# Patient Record
Sex: Female | Born: 1940 | Race: Black or African American | Hispanic: No | Marital: Married | Smoking: Never smoker
Health system: Southern US, Community
[De-identification: ages and names within clinical notes are randomized; demographics above are authoritative.]

## PROBLEM LIST (undated history)

## (undated) DIAGNOSIS — M1990 Unspecified osteoarthritis, unspecified site: Secondary | ICD-10-CM

## (undated) DIAGNOSIS — N2889 Other specified disorders of kidney and ureter: Secondary | ICD-10-CM

## (undated) DIAGNOSIS — F0390 Unspecified dementia without behavioral disturbance: Secondary | ICD-10-CM

## (undated) DIAGNOSIS — I10 Essential (primary) hypertension: Secondary | ICD-10-CM

## (undated) DIAGNOSIS — N736 Female pelvic peritoneal adhesions (postinfective): Secondary | ICD-10-CM

## (undated) HISTORY — PX: BACK SURGERY: SHX140

## (undated) HISTORY — PX: PARTIAL HYSTERECTOMY: SHX80

## (undated) HISTORY — PX: ABDOMINAL HYSTERECTOMY: SHX81

## (undated) HISTORY — DX: Female pelvic peritoneal adhesions (postinfective): N73.6

## (undated) HISTORY — DX: Essential (primary) hypertension: I10

---

## 1990-03-16 HISTORY — PX: UTERINE FIBROID SURGERY: SHX826

## 2000-08-02 ENCOUNTER — Other Ambulatory Visit: Admission: RE | Admit: 2000-08-02 | Discharge: 2000-08-02 | Payer: Self-pay | Admitting: Family Medicine

## 2002-11-24 ENCOUNTER — Encounter: Payer: Self-pay | Admitting: General Surgery

## 2002-11-24 ENCOUNTER — Ambulatory Visit (HOSPITAL_COMMUNITY): Admission: RE | Admit: 2002-11-24 | Discharge: 2002-11-24 | Payer: Self-pay | Admitting: General Surgery

## 2003-01-01 ENCOUNTER — Ambulatory Visit (HOSPITAL_COMMUNITY): Admission: RE | Admit: 2003-01-01 | Discharge: 2003-01-01 | Payer: Self-pay | Admitting: General Surgery

## 2003-01-01 ENCOUNTER — Encounter: Payer: Self-pay | Admitting: General Surgery

## 2003-05-16 ENCOUNTER — Emergency Department (HOSPITAL_COMMUNITY): Admission: EM | Admit: 2003-05-16 | Discharge: 2003-05-16 | Payer: Self-pay | Admitting: Emergency Medicine

## 2003-12-19 ENCOUNTER — Encounter: Admission: RE | Admit: 2003-12-19 | Discharge: 2003-12-19 | Payer: Self-pay | Admitting: Internal Medicine

## 2004-01-15 ENCOUNTER — Encounter (INDEPENDENT_AMBULATORY_CARE_PROVIDER_SITE_OTHER): Payer: Self-pay | Admitting: *Deleted

## 2004-01-15 ENCOUNTER — Ambulatory Visit (HOSPITAL_COMMUNITY): Admission: RE | Admit: 2004-01-15 | Discharge: 2004-01-15 | Payer: Self-pay | Admitting: Gastroenterology

## 2004-01-28 ENCOUNTER — Encounter: Admission: RE | Admit: 2004-01-28 | Discharge: 2004-02-25 | Payer: Self-pay | Admitting: Internal Medicine

## 2004-08-03 ENCOUNTER — Emergency Department (HOSPITAL_COMMUNITY): Admission: EM | Admit: 2004-08-03 | Discharge: 2004-08-03 | Payer: Self-pay | Admitting: Emergency Medicine

## 2004-12-25 ENCOUNTER — Encounter: Admission: RE | Admit: 2004-12-25 | Discharge: 2004-12-25 | Payer: Self-pay | Admitting: Internal Medicine

## 2005-09-30 ENCOUNTER — Emergency Department (HOSPITAL_COMMUNITY): Admission: EM | Admit: 2005-09-30 | Discharge: 2005-09-30 | Payer: Self-pay | Admitting: Emergency Medicine

## 2006-01-29 ENCOUNTER — Encounter: Admission: RE | Admit: 2006-01-29 | Discharge: 2006-01-29 | Payer: Self-pay | Admitting: Family Medicine

## 2006-02-05 ENCOUNTER — Encounter: Admission: RE | Admit: 2006-02-05 | Discharge: 2006-02-05 | Payer: Self-pay | Admitting: Family Medicine

## 2006-02-11 ENCOUNTER — Encounter: Admission: RE | Admit: 2006-02-11 | Discharge: 2006-02-11 | Payer: Self-pay | Admitting: Family Medicine

## 2006-06-01 ENCOUNTER — Encounter: Admission: RE | Admit: 2006-06-01 | Discharge: 2006-06-01 | Payer: Self-pay | Admitting: Family Medicine

## 2006-12-20 ENCOUNTER — Encounter: Admission: RE | Admit: 2006-12-20 | Discharge: 2006-12-20 | Payer: Self-pay | Admitting: Family Medicine

## 2007-05-19 ENCOUNTER — Encounter: Admission: RE | Admit: 2007-05-19 | Discharge: 2007-05-19 | Payer: Self-pay | Admitting: Family Medicine

## 2009-03-29 ENCOUNTER — Encounter: Admission: RE | Admit: 2009-03-29 | Discharge: 2009-03-29 | Payer: Self-pay | Admitting: Family Medicine

## 2009-06-02 ENCOUNTER — Emergency Department (HOSPITAL_COMMUNITY): Admission: EM | Admit: 2009-06-02 | Discharge: 2009-06-02 | Payer: Self-pay | Admitting: Emergency Medicine

## 2009-06-20 ENCOUNTER — Encounter: Admission: RE | Admit: 2009-06-20 | Discharge: 2009-06-20 | Payer: Self-pay | Admitting: Family Medicine

## 2009-08-16 ENCOUNTER — Encounter: Admission: RE | Admit: 2009-08-16 | Discharge: 2009-08-16 | Payer: Self-pay | Admitting: Family Medicine

## 2009-12-21 ENCOUNTER — Emergency Department (HOSPITAL_COMMUNITY): Admission: EM | Admit: 2009-12-21 | Discharge: 2009-12-21 | Payer: Self-pay | Admitting: Emergency Medicine

## 2010-03-18 ENCOUNTER — Emergency Department (HOSPITAL_COMMUNITY)
Admission: EM | Admit: 2010-03-18 | Discharge: 2010-03-18 | Payer: Self-pay | Source: Home / Self Care | Admitting: Emergency Medicine

## 2010-04-10 ENCOUNTER — Emergency Department (HOSPITAL_COMMUNITY)
Admission: EM | Admit: 2010-04-10 | Discharge: 2010-04-10 | Payer: Self-pay | Source: Home / Self Care | Admitting: Emergency Medicine

## 2010-04-10 LAB — COMPREHENSIVE METABOLIC PANEL
Albumin: 3.8 g/dL (ref 3.5–5.2)
Alkaline Phosphatase: 43 U/L (ref 39–117)
BUN: 13 mg/dL (ref 6–23)
Chloride: 106 mEq/L (ref 96–112)
Creatinine, Ser: 0.84 mg/dL (ref 0.4–1.2)
GFR calc non Af Amer: >60 mL/min (ref >60)
Glucose, Bld: 94 mg/dL (ref 70–99)
Total Bilirubin: 0.5 mg/dL (ref 0.3–1.2)

## 2010-04-10 LAB — DIFFERENTIAL
Eosinophils Absolute: 0.1 10*3/uL (ref 0.0–0.7)
Eosinophils Relative: 1 % (ref 0–5)
Lymphocytes Relative: 36 % (ref 12–46)
Lymphs Abs: 3.1 10*3/uL (ref 0.7–4.0)
Monocytes Relative: 7 % (ref 3–12)

## 2010-04-10 LAB — CBC
HCT: 33.2 % — ABNORMAL LOW (ref 36.0–46.0)
MCH: 32.6 pg (ref 26.0–34.0)
MCV: 94.1 fL (ref 78.0–100.0)
Platelets: 159 10*3/uL (ref 150–400)
RBC: 3.53 MIL/uL — ABNORMAL LOW (ref 3.87–5.11)

## 2010-04-10 LAB — URINALYSIS, ROUTINE W REFLEX MICROSCOPIC
Hgb urine dipstick: NEGATIVE
Nitrite: NEGATIVE
Protein, ur: NEGATIVE mg/dL
Specific Gravity, Urine: 1.026 (ref 1.005–1.030)
Urobilinogen, UA: 1 mg/dL (ref 0.0–1.0)

## 2010-04-10 LAB — POCT CARDIAC MARKERS

## 2010-04-15 ENCOUNTER — Encounter
Admission: RE | Admit: 2010-04-15 | Discharge: 2010-04-15 | Payer: Self-pay | Source: Home / Self Care | Attending: Family Medicine | Admitting: Family Medicine

## 2010-04-16 ENCOUNTER — Other Ambulatory Visit: Payer: Self-pay | Admitting: Family Medicine

## 2010-04-16 DIAGNOSIS — R928 Other abnormal and inconclusive findings on diagnostic imaging of breast: Secondary | ICD-10-CM

## 2010-04-23 ENCOUNTER — Other Ambulatory Visit: Payer: Self-pay

## 2010-04-30 ENCOUNTER — Other Ambulatory Visit: Payer: Self-pay

## 2010-05-06 ENCOUNTER — Ambulatory Visit: Payer: Self-pay

## 2010-05-12 ENCOUNTER — Ambulatory Visit
Admission: RE | Admit: 2010-05-12 | Discharge: 2010-05-12 | Disposition: A | Discharge status: Home / Self Care | Payer: Self-pay | Source: Ambulatory Visit | Attending: Family Medicine | Admitting: Family Medicine

## 2010-05-12 DIAGNOSIS — R928 Other abnormal and inconclusive findings on diagnostic imaging of breast: Secondary | ICD-10-CM

## 2010-05-23 ENCOUNTER — Emergency Department (HOSPITAL_COMMUNITY)
Admission: EM | Admit: 2010-05-23 | Discharge: 2010-05-23 | Disposition: A | Discharge status: Home / Self Care | Payer: Medicare Other | Attending: Emergency Medicine | Admitting: Emergency Medicine

## 2010-05-23 ENCOUNTER — Emergency Department (HOSPITAL_COMMUNITY): Payer: Medicare Other

## 2010-05-23 DIAGNOSIS — Z79899 Other long term (current) drug therapy: Secondary | ICD-10-CM | POA: Insufficient documentation

## 2010-05-23 DIAGNOSIS — E7800 Pure hypercholesterolemia, unspecified: Secondary | ICD-10-CM | POA: Insufficient documentation

## 2010-05-23 DIAGNOSIS — E119 Type 2 diabetes mellitus without complications: Secondary | ICD-10-CM | POA: Insufficient documentation

## 2010-05-23 DIAGNOSIS — Z7982 Long term (current) use of aspirin: Secondary | ICD-10-CM | POA: Insufficient documentation

## 2010-05-23 DIAGNOSIS — K5900 Constipation, unspecified: Secondary | ICD-10-CM | POA: Insufficient documentation

## 2010-05-23 DIAGNOSIS — I10 Essential (primary) hypertension: Secondary | ICD-10-CM | POA: Insufficient documentation

## 2010-05-23 DIAGNOSIS — R1032 Left lower quadrant pain: Secondary | ICD-10-CM | POA: Insufficient documentation

## 2010-05-23 LAB — URINALYSIS, ROUTINE W REFLEX MICROSCOPIC
Bilirubin Urine: NEGATIVE
Ketones, ur: NEGATIVE mg/dL
Nitrite: NEGATIVE
Protein, ur: NEGATIVE mg/dL
Urobilinogen, UA: 1 mg/dL (ref 0.0–1.0)

## 2010-05-23 LAB — LIPASE, BLOOD: Lipase: 30 U/L (ref 11–59)

## 2010-05-26 LAB — DIFFERENTIAL
Basophils Absolute: 0 10*3/uL (ref 0.0–0.1)
Eosinophils Absolute: 0 10*3/uL (ref 0.0–0.7)
Eosinophils Relative: 1 % (ref 0–5)
Lymphocytes Relative: 20 % (ref 12–46)
Monocytes Absolute: 0.5 10*3/uL (ref 0.1–1.0)

## 2010-05-26 LAB — URINALYSIS, ROUTINE W REFLEX MICROSCOPIC
Bilirubin Urine: NEGATIVE
Glucose, UA: NEGATIVE mg/dL
Hgb urine dipstick: NEGATIVE
Ketones, ur: NEGATIVE mg/dL
Nitrite: NEGATIVE
Protein, ur: NEGATIVE mg/dL
Specific Gravity, Urine: 1.016 (ref 1.005–1.030)
Urobilinogen, UA: 0.2 mg/dL (ref 0.0–1.0)
pH: 5.5 (ref 5.0–8.0)

## 2010-05-26 LAB — COMPREHENSIVE METABOLIC PANEL
ALT: 13 U/L (ref 0–35)
AST: 24 U/L (ref 0–37)
Albumin: 3.7 g/dL (ref 3.5–5.2)
Alkaline Phosphatase: 44 U/L (ref 39–117)
BUN: 16 mg/dL (ref 6–23)
CO2: 24 mEq/L (ref 19–32)
Calcium: 9.7 mg/dL (ref 8.4–10.5)
Chloride: 106 mEq/L (ref 96–112)
Creatinine, Ser: 0.82 mg/dL (ref 0.4–1.2)
GFR calc Af Amer: >60 mL/min (ref >60)
GFR calc non Af Amer: >60 mL/min (ref >60)
Glucose, Bld: 104 mg/dL — ABNORMAL HIGH (ref 70–99)
Potassium: 4.6 mEq/L (ref 3.5–5.1)
Sodium: 142 mEq/L (ref 135–145)
Total Bilirubin: 0.3 mg/dL (ref 0.3–1.2)
Total Protein: 6 g/dL (ref 6.0–8.3)

## 2010-05-26 LAB — URINE MICROSCOPIC-ADD ON

## 2010-05-26 LAB — GLUCOSE, CAPILLARY: Glucose-Capillary: 83 mg/dL (ref 70–99)

## 2010-05-26 LAB — CBC
HCT: 34.2 % — ABNORMAL LOW (ref 36.0–46.0)
MCH: 32.6 pg (ref 26.0–34.0)
MCV: 96.1 fL (ref 78.0–100.0)
Platelets: 122 10*3/uL — ABNORMAL LOW (ref 150–400)
RDW: 12.9 % (ref 11.5–15.5)
WBC: 7.7 10*3/uL (ref 4.0–10.5)

## 2010-05-26 LAB — LIPASE, BLOOD: Lipase: 20 U/L (ref 11–59)

## 2010-05-29 LAB — COMPREHENSIVE METABOLIC PANEL
Albumin: 4 g/dL (ref 3.5–5.2)
BUN: 16 mg/dL (ref 6–23)
Calcium: 10.1 mg/dL (ref 8.4–10.5)
Glucose, Bld: 117 mg/dL — ABNORMAL HIGH (ref 70–99)
Sodium: 142 mEq/L (ref 135–145)
Total Protein: 6.9 g/dL (ref 6.0–8.3)

## 2010-05-29 LAB — URINALYSIS, ROUTINE W REFLEX MICROSCOPIC
Bilirubin Urine: NEGATIVE
Glucose, UA: NEGATIVE mg/dL
Hgb urine dipstick: NEGATIVE
Ketones, ur: NEGATIVE mg/dL
Nitrite: NEGATIVE
Protein, ur: NEGATIVE mg/dL
Specific Gravity, Urine: 1.009 (ref 1.005–1.030)
Urobilinogen, UA: 0.2 mg/dL (ref 0.0–1.0)
pH: 7.5 (ref 5.0–8.0)

## 2010-05-29 LAB — DIFFERENTIAL
Basophils Absolute: 0 K/uL (ref 0.0–0.1)
Basophils Relative: 0 % (ref 0–1)
Eosinophils Absolute: 0.1 K/uL (ref 0.0–0.7)
Eosinophils Relative: 1 % (ref 0–5)
Lymphocytes Relative: 29 % (ref 12–46)
Lymphs Abs: 2 10*3/uL (ref 0.7–4.0)
Monocytes Absolute: 0.5 10*3/uL (ref 0.1–1.0)
Monocytes Relative: 7 % (ref 3–12)
Neutro Abs: 4.4 10*3/uL (ref 1.7–7.7)
Neutrophils Relative %: 63 % (ref 43–77)

## 2010-05-29 LAB — COMPREHENSIVE METABOLIC PANEL WITH GFR
ALT: 18 U/L (ref 0–35)
AST: 26 U/L (ref 0–37)
Alkaline Phosphatase: 46 U/L (ref 39–117)
CO2: 28 meq/L (ref 19–32)
Chloride: 107 meq/L (ref 96–112)
Creatinine, Ser: 0.97 mg/dL (ref 0.4–1.2)
GFR calc Af Amer: >60 mL/min (ref >60)
GFR calc non Af Amer: 57 mL/min — ABNORMAL LOW (ref >60)
Potassium: 3.6 meq/L (ref 3.5–5.1)
Total Bilirubin: 0.7 mg/dL (ref 0.3–1.2)

## 2010-05-29 LAB — CBC
HCT: 36.6 % (ref 36.0–46.0)
Hemoglobin: 12.9 g/dL (ref 12.0–15.0)
MCH: 33.2 pg (ref 26.0–34.0)
MCHC: 35.2 g/dL (ref 30.0–36.0)
MCV: 94.3 fL (ref 78.0–100.0)
Platelets: 128 10*3/uL — ABNORMAL LOW (ref 150–400)
RBC: 3.88 MIL/uL (ref 3.87–5.11)
RDW: 12.1 % (ref 11.5–15.5)
WBC: 7 10*3/uL (ref 4.0–10.5)

## 2010-05-29 LAB — LIPASE, BLOOD: Lipase: 24 U/L (ref 11–59)

## 2010-06-09 LAB — CBC
HCT: 35 % — ABNORMAL LOW (ref 36.0–46.0)
Hemoglobin: 12.1 g/dL (ref 12.0–15.0)
MCHC: 34.4 g/dL (ref 30.0–36.0)
MCV: 99.5 fL (ref 78.0–100.0)
Platelets: 120 K/uL — ABNORMAL LOW (ref 150–400)
RBC: 3.52 MIL/uL — ABNORMAL LOW (ref 3.87–5.11)
RDW: 12.9 % (ref 11.5–15.5)
WBC: 8.6 10*3/uL (ref 4.0–10.5)

## 2010-06-09 LAB — COMPREHENSIVE METABOLIC PANEL WITH GFR
ALT: 18 U/L (ref 0–35)
AST: 22 U/L (ref 0–37)
Albumin: 3.8 g/dL (ref 3.5–5.2)
Alkaline Phosphatase: 60 U/L (ref 39–117)
Potassium: 3.8 meq/L (ref 3.5–5.1)
Sodium: 140 meq/L (ref 135–145)
Total Protein: 6.8 g/dL (ref 6.0–8.3)

## 2010-06-09 LAB — URINALYSIS, ROUTINE W REFLEX MICROSCOPIC
Bilirubin Urine: NEGATIVE
Glucose, UA: NEGATIVE mg/dL
Hgb urine dipstick: NEGATIVE
Ketones, ur: NEGATIVE mg/dL
Nitrite: NEGATIVE
Protein, ur: NEGATIVE mg/dL
Specific Gravity, Urine: 1.01 (ref 1.005–1.030)
Urobilinogen, UA: 0.2 mg/dL (ref 0.0–1.0)
pH: 5.5 (ref 5.0–8.0)

## 2010-06-09 LAB — COMPREHENSIVE METABOLIC PANEL
BUN: 11 mg/dL (ref 6–23)
CO2: 28 mEq/L (ref 19–32)
Calcium: 9.7 mg/dL (ref 8.4–10.5)
Chloride: 106 mEq/L (ref 96–112)
Creatinine, Ser: 0.84 mg/dL (ref 0.4–1.2)
GFR calc Af Amer: >60 mL/min (ref >60)
GFR calc non Af Amer: >60 mL/min (ref >60)
Glucose, Bld: 122 mg/dL — ABNORMAL HIGH (ref 70–99)
Total Bilirubin: 0.6 mg/dL (ref 0.3–1.2)

## 2010-06-09 LAB — LIPASE, BLOOD: Lipase: 19 U/L (ref 11–59)

## 2010-06-09 LAB — DIFFERENTIAL
Basophils Absolute: 0 10*3/uL (ref 0.0–0.1)
Basophils Relative: 0 % (ref 0–1)
Eosinophils Absolute: 0.1 K/uL (ref 0.0–0.7)
Eosinophils Relative: 1 % (ref 0–5)
Lymphocytes Relative: 23 % (ref 12–46)
Lymphs Abs: 2 10*3/uL (ref 0.7–4.0)
Monocytes Absolute: 0.6 K/uL (ref 0.1–1.0)
Monocytes Relative: 7 % (ref 3–12)
Neutro Abs: 6 10*3/uL (ref 1.7–7.7)
Neutrophils Relative %: 69 % (ref 43–77)

## 2010-06-09 LAB — POCT CARDIAC MARKERS
CKMB, poc: <1 ng/mL — ABNORMAL LOW (ref 1.0–8.0)
Myoglobin, poc: 50.6 ng/mL (ref 12–200)
Troponin i, poc: <0.05 ng/mL (ref 0.00–0.09)

## 2010-06-09 LAB — SEDIMENTATION RATE: Sed Rate: 3 mm/hr (ref 0–22)

## 2010-06-09 LAB — TSH: TSH: 0.993 u[IU]/mL (ref 0.350–4.500)

## 2010-07-29 ENCOUNTER — Ambulatory Visit: Payer: Medicare Other | Attending: Internal Medicine | Admitting: Rehabilitative and Restorative Service Providers"

## 2010-07-29 DIAGNOSIS — IMO0001 Reserved for inherently not codable concepts without codable children: Secondary | ICD-10-CM | POA: Insufficient documentation

## 2010-07-29 DIAGNOSIS — M25559 Pain in unspecified hip: Secondary | ICD-10-CM | POA: Insufficient documentation

## 2010-08-01 NOTE — Op Note (Signed)
Valerie Sullivan, Valerie Sullivan               ACCOUNT NO.:  [id number]   MEDICAL RECORD NO.:  [id number]          PATIENT TYPE:  AMB   LOCATION:  ENDO                         FACILITY:  [facility]   PHYSICIAN:  Danise Edge, M.D.   DATE OF BIRTH:  Mar 20, 1940   DATE OF PROCEDURE:  01/15/2004  DATE OF DISCHARGE:                                 OPERATIVE REPORT   PROCEDURE:  Screening colonoscopy.   PROCEDURE INDICATION:  Ms. Valerie Sullivan is a 70 year old female, born 08-30-1940.  Ms. Valerie Sullivan is scheduled to undergo her first screening  colonoscopy with polypectomy to prevent colon cancer.   ENDOSCOPIST:  Danise Edge, M.D.   PREMEDICATION:  1.  Versed 8.5 mg.  2.  Demerol 80 mg.   DESCRIPTION OF PROCEDURE:  After obtaining informed consent, Ms. Valerie Sullivan  was placed in the left lateral decubitus position.  I administered  intravenous Demerol and intravenous Versed to achieve conscious sedation for  the procedure.  The patient's blood pressure, oxygen saturation, and cardiac  rhythm were monitored throughout the procedure and documented in the medical  record.   Anal inspection and digital rectal exam were normal.  The Olympus adjustable  pediatric colonoscope was introduced into the rectum and advanced to the  cecum.  Colonic preparation for the exam today was excellent.   RECTUM:  Normal.  SIGMOID COLON AND DESCENDING COLON:  Normal.  SPLENIC FLEXURE:  Normal.  TRANSVERSE COLON:  Normal.  HEPATIC FLEXURE:  Normal.  ASCENDING COLON:  From the proximal ascending colon, a 2 mm sessile polyp  was removed with electrocautery snare and submitted for pathological  interpretation.  CECUM AND ILEOCECAL VALVE:  Normal.   ASSESSMENT:  A small polyp was removed from the proximal ascending colon and  submitted for pathological interpretation; otherwise, normal screening  proctocolonoscopy to the cecum.      MJ/MEDQ  D:  01/15/2004  T:  01/15/2004  Job:  [id number]

## 2010-08-05 ENCOUNTER — Ambulatory Visit: Payer: Medicare Other | Admitting: Physical Therapy

## 2010-08-07 ENCOUNTER — Ambulatory Visit: Payer: Medicare Other | Admitting: Physical Therapy

## 2010-08-08 ENCOUNTER — Emergency Department (HOSPITAL_COMMUNITY)
Admission: EM | Admit: 2010-08-08 | Discharge: 2010-08-08 | Disposition: A | Discharge status: Home / Self Care | Payer: Medicare Other | Attending: Emergency Medicine | Admitting: Emergency Medicine

## 2010-08-08 ENCOUNTER — Emergency Department (HOSPITAL_COMMUNITY): Payer: Medicare Other

## 2010-08-08 DIAGNOSIS — E7800 Pure hypercholesterolemia, unspecified: Secondary | ICD-10-CM | POA: Insufficient documentation

## 2010-08-08 DIAGNOSIS — M1710 Unilateral primary osteoarthritis, unspecified knee: Secondary | ICD-10-CM | POA: Insufficient documentation

## 2010-08-08 DIAGNOSIS — M79609 Pain in unspecified limb: Secondary | ICD-10-CM

## 2010-08-08 DIAGNOSIS — E119 Type 2 diabetes mellitus without complications: Secondary | ICD-10-CM | POA: Insufficient documentation

## 2010-08-08 DIAGNOSIS — I10 Essential (primary) hypertension: Secondary | ICD-10-CM | POA: Insufficient documentation

## 2010-08-08 DIAGNOSIS — M7989 Other specified soft tissue disorders: Secondary | ICD-10-CM | POA: Insufficient documentation

## 2010-08-08 LAB — CBC
Hemoglobin: 10.5 g/dL — ABNORMAL LOW (ref 12.0–15.0)
MCH: 31.9 pg (ref 26.0–34.0)
MCHC: 34.2 g/dL (ref 30.0–36.0)
MCV: 93.3 fL (ref 78.0–100.0)
RBC: 3.29 MIL/uL — ABNORMAL LOW (ref 3.87–5.11)

## 2010-08-08 LAB — DIFFERENTIAL
Eosinophils Absolute: 0.1 10*3/uL (ref 0.0–0.7)
Lymphs Abs: 2.4 10*3/uL (ref 0.7–4.0)
Monocytes Absolute: 0.5 10*3/uL (ref 0.1–1.0)
Monocytes Relative: 8 % (ref 3–12)
Neutro Abs: 3.7 10*3/uL (ref 1.7–7.7)
Neutrophils Relative %: 54 % (ref 43–77)

## 2010-08-08 LAB — BASIC METABOLIC PANEL
CO2: 27 mEq/L (ref 19–32)
Chloride: 108 mEq/L (ref 96–112)
GFR calc Af Amer: >60 mL/min (ref >60)
Sodium: 141 mEq/L (ref 135–145)

## 2010-08-12 ENCOUNTER — Ambulatory Visit: Payer: Medicare Other | Admitting: Physical Therapy

## 2010-08-14 ENCOUNTER — Ambulatory Visit: Payer: Medicare Other | Admitting: Physical Therapy

## 2010-08-20 ENCOUNTER — Ambulatory Visit: Payer: Medicare Other | Admitting: Physical Therapy

## 2010-08-25 ENCOUNTER — Encounter: Payer: Medicare Other | Admitting: Physical Therapy

## 2010-12-05 ENCOUNTER — Other Ambulatory Visit (HOSPITAL_COMMUNITY): Payer: Self-pay | Admitting: Urology

## 2010-12-05 DIAGNOSIS — D49519 Neoplasm of unspecified behavior of unspecified kidney: Secondary | ICD-10-CM

## 2010-12-10 ENCOUNTER — Emergency Department (HOSPITAL_COMMUNITY): Payer: Medicare Other

## 2010-12-10 ENCOUNTER — Emergency Department (HOSPITAL_COMMUNITY)
Admission: EM | Admit: 2010-12-10 | Discharge: 2010-12-10 | Disposition: A | Discharge status: Home / Self Care | Payer: Medicare Other | Attending: Emergency Medicine | Admitting: Emergency Medicine

## 2010-12-10 DIAGNOSIS — S0990XA Unspecified injury of head, initial encounter: Secondary | ICD-10-CM | POA: Insufficient documentation

## 2010-12-10 DIAGNOSIS — Z79899 Other long term (current) drug therapy: Secondary | ICD-10-CM | POA: Insufficient documentation

## 2010-12-10 DIAGNOSIS — M549 Dorsalgia, unspecified: Secondary | ICD-10-CM | POA: Insufficient documentation

## 2010-12-10 DIAGNOSIS — I10 Essential (primary) hypertension: Secondary | ICD-10-CM | POA: Insufficient documentation

## 2010-12-10 DIAGNOSIS — K117 Disturbances of salivary secretion: Secondary | ICD-10-CM | POA: Insufficient documentation

## 2010-12-10 DIAGNOSIS — R631 Polydipsia: Secondary | ICD-10-CM | POA: Insufficient documentation

## 2010-12-10 DIAGNOSIS — R51 Headache: Secondary | ICD-10-CM | POA: Insufficient documentation

## 2010-12-10 DIAGNOSIS — W1809XA Striking against other object with subsequent fall, initial encounter: Secondary | ICD-10-CM | POA: Insufficient documentation

## 2010-12-10 DIAGNOSIS — E789 Disorder of lipoprotein metabolism, unspecified: Secondary | ICD-10-CM | POA: Insufficient documentation

## 2010-12-10 DIAGNOSIS — E119 Type 2 diabetes mellitus without complications: Secondary | ICD-10-CM | POA: Insufficient documentation

## 2010-12-10 DIAGNOSIS — R5381 Other malaise: Secondary | ICD-10-CM | POA: Insufficient documentation

## 2010-12-10 LAB — GLUCOSE, CAPILLARY

## 2010-12-10 LAB — POCT I-STAT, CHEM 8
BUN: 17 mg/dL (ref 6–23)
Chloride: 107 mEq/L (ref 96–112)
Creatinine, Ser: 0.8 mg/dL (ref 0.50–1.10)
Glucose, Bld: 129 mg/dL — ABNORMAL HIGH (ref 70–99)
Potassium: 3.8 mEq/L (ref 3.5–5.1)

## 2010-12-30 ENCOUNTER — Ambulatory Visit (HOSPITAL_COMMUNITY)
Admission: RE | Admit: 2010-12-30 | Discharge: 2010-12-30 | Disposition: A | Discharge status: Home / Self Care | Payer: Medicare Other | Source: Ambulatory Visit | Attending: Urology | Admitting: Urology

## 2010-12-30 DIAGNOSIS — D49519 Neoplasm of unspecified behavior of unspecified kidney: Secondary | ICD-10-CM

## 2010-12-30 DIAGNOSIS — Q278 Other specified congenital malformations of peripheral vascular system: Secondary | ICD-10-CM | POA: Insufficient documentation

## 2010-12-30 DIAGNOSIS — K7689 Other specified diseases of liver: Secondary | ICD-10-CM | POA: Insufficient documentation

## 2010-12-30 DIAGNOSIS — N289 Disorder of kidney and ureter, unspecified: Secondary | ICD-10-CM | POA: Insufficient documentation

## 2010-12-30 MED ORDER — GADOBENATE DIMEGLUMINE 529 MG/ML IV SOLN
15.0000 mL | Freq: Once | INTRAVENOUS | Status: AC | PRN
  Administered 2010-12-30: 15 mL via INTRAVENOUS

## 2011-03-19 ENCOUNTER — Telehealth: Payer: Self-pay | Admitting: Oncology

## 2011-03-19 ENCOUNTER — Telehealth: Payer: Self-pay | Admitting: *Deleted

## 2011-03-19 NOTE — Telephone Encounter (Signed)
patient confirmed over the phone the new date and time on 04-16-2011 starting at 1:30pm with fcounseling

## 2011-03-19 NOTE — Telephone Encounter (Signed)
Referred by Dr. Fleet Contras Dx- Risk of cancer consult

## 2011-04-16 ENCOUNTER — Ambulatory Visit: Payer: Medicare Other

## 2011-04-16 ENCOUNTER — Ambulatory Visit (HOSPITAL_BASED_OUTPATIENT_CLINIC_OR_DEPARTMENT_OTHER): Payer: Medicare Other | Admitting: Oncology

## 2011-04-16 ENCOUNTER — Other Ambulatory Visit: Payer: Medicare Other | Admitting: Lab

## 2011-04-16 ENCOUNTER — Ambulatory Visit (HOSPITAL_BASED_OUTPATIENT_CLINIC_OR_DEPARTMENT_OTHER): Payer: Medicare Other

## 2011-04-16 VITALS — BP 134/78 | HR 69 | Temp 98.5°F | Ht 62.0 in | Wt 164.1 lb

## 2011-04-16 DIAGNOSIS — N289 Disorder of kidney and ureter, unspecified: Secondary | ICD-10-CM

## 2011-04-16 DIAGNOSIS — D649 Anemia, unspecified: Secondary | ICD-10-CM

## 2011-04-16 DIAGNOSIS — N2889 Other specified disorders of kidney and ureter: Secondary | ICD-10-CM

## 2011-04-16 DIAGNOSIS — M545 Low back pain: Secondary | ICD-10-CM

## 2011-04-16 LAB — CBC & DIFF AND RETIC
BASO%: 0.6 % (ref 0.0–2.0)
Immature Retic Fract: 8 % (ref 1.60–10.00)
MCHC: 34.4 g/dL (ref 31.5–36.0)
MONO#: 0.4 10*3/uL (ref 0.1–0.9)
RBC: 3.77 10*6/uL (ref 3.70–5.45)
Retic %: 3 % — ABNORMAL HIGH (ref 0.70–2.10)
WBC: 7 10*3/uL (ref 3.9–10.3)
lymph#: 2.5 10*3/uL (ref 0.9–3.3)

## 2011-04-16 LAB — COMPREHENSIVE METABOLIC PANEL
Albumin: 3.9 g/dL (ref 3.5–5.2)
Alkaline Phosphatase: 73 U/L (ref 39–117)
BUN: 17 mg/dL (ref 6–23)
Calcium: 10.1 mg/dL (ref 8.4–10.5)
Chloride: 102 mEq/L (ref 96–112)
Glucose, Bld: 97 mg/dL (ref 70–99)
Potassium: 3.4 mEq/L — ABNORMAL LOW (ref 3.5–5.3)

## 2011-04-16 NOTE — Patient Instructions (Signed)
I will order blood work as well as a PET scan to see if the spot in the kidney is cancer and if there is cancer elsewhere in the body. Valerie Sullivan may benefit from seeing a pain doctor I will see her again in 3 weeks.

## 2011-04-19 NOTE — Progress Notes (Signed)
Referral MD Dr Valerie Sullivan; Dr Valerie Sullivan  Reason for Referral: Renal mass, bone pain  No chief complaint on file. : 71 yo female from [place] here with her husband for evaluation of a renal mass and bone pain  HPI: Pt had a previous CT scan in 1/12, which revealed a mass in the lower pole of her right kidney, this has been followed by urology and she has not had a resection, Last scan , MRI 10/12, showed a stable lesion in that location.She has been having progressive bone /jt and back pain for the past 6-12 mo. This has been progressive and resulted in increasing weakness and inability  To perform her ADLs  No past medical history on file.: Allergic rhinitis Hyperlipidemia Depression Diabetes mellitus reflux  No past surgical history on file.: Partial hysterectomy secondary to fibroids  Current outpatient prescriptions:aspirin 81 MG tablet, Take 81 mg by mouth daily., Disp: , Rfl: ;  atorvastatin (LIPITOR) 20 MG tablet, Take 20 mg by mouth daily., Disp: , Rfl: ;  celecoxib (CELEBREX) 200 MG capsule, Take 200 mg by mouth 2 (two) times daily., Disp: , Rfl: ;  citalopram (CELEXA) 20 MG tablet, Take 20 mg by mouth daily., Disp: , Rfl: ;  omeprazole (PRILOSEC) 20 MG capsule, Take 20 mg by mouth daily., Disp: , Rfl:  oxyCODONE-acetaminophen (PERCOCET) 5-325 MG per tablet, Take 1 tablet by mouth every 4 (four) hours as needed., Disp: , Rfl: ;  valsartan-hydrochlorothiazide (DIOVAN-HCT) 160-12.5 MG per tablet, Take 1 tablet by mouth daily., Disp: , Rfl: ;  Vitamin D, Ergocalciferol, (DRISDOL) 50000 UNITS CAPS, Take 50,000 Units by mouth., Disp: , Rfl: :    :  No Known Allergies:  No family history on file. Hx myeloma in father:  History   Social History  . Marital Status: Married x12y    Spouse Name: N/A    Number of Children: N/A 1 daughter age 33 , 3 grandchildren  . Years of Education: N/A   Occupational History  . Not on file.   Social History Main Topics  . Smoking status: none Not  on file  . Smokeless tobacco: Not on file  . Alcohol Use: none Not on file  . Drug Use: Not on file  . Sexually Active: Not on file   Other Topics Concern  . Not on file   Social History Narrative  . No narrative on file previously worked in [employer] x 25 y  :  Constitutional: positive for weight loss Low back pain  Exam: @IPVITALS @ General appearance: appears stated age, distracted and fatigued Head: Normocephalic, without obvious abnormality, atraumatic Throat: lips, mucosa, and tongue normal; teeth and gums normal Resp: clear to auscultation bilaterally and normal percussion bilaterally Breasts: normal appearance, no masses or tenderness Cardio: regular rate and rhythm, S1, S2 normal, no murmur, click, rub or gallop and normal apical impulse GI: soft, non-tender; bowel sounds normal; no masses,  no organomegaly Extremities: extremities normal, atraumatic, no cyanosis or edema Pulses: 2+ and symmetric Lymph nodes: Cervical, supraclavicular, and axillary nodes normal.   Blood smear review: n/a  Pathology:n/a  No results found.  Assessment and Plan: 71  Yo woman with hx of progressive low back pain and hx of renal mass. Etiology of the pain is likely  Osteoarthritis, but I feel a PET scan would be prudent given the presence of a renal mass. I have scheduled the study as well as other screening blood work and will see Valerie Sullivan back in 3 weeks.

## 2011-04-20 LAB — IRON AND TIBC
%SAT: 34 % (ref 20–55)
Iron: 92 ug/dL (ref 42–145)
UIBC: 179 ug/dL (ref 125–400)

## 2011-04-20 LAB — PROTEIN ELECTROPHORESIS, SERUM
Albumin ELP: 59.1 % (ref 55.8–66.1)
Alpha-1-Globulin: 4.8 % (ref 2.9–4.9)
Beta 2: 4.9 % (ref 3.2–6.5)
Beta Globulin: 4.8 % (ref 4.7–7.2)

## 2011-05-06 ENCOUNTER — Telehealth: Payer: Self-pay | Admitting: *Deleted

## 2011-05-06 NOTE — Telephone Encounter (Signed)
per conversation with the receptionist at the pain clinic they have not received a fax sent message to Valerie Sullivan about faxing the documents to [phone] so they can schedule the patient an appointment

## 2011-06-18 ENCOUNTER — Other Ambulatory Visit (HOSPITAL_COMMUNITY): Payer: Self-pay | Admitting: Urology

## 2011-06-18 DIAGNOSIS — D49519 Neoplasm of unspecified behavior of unspecified kidney: Secondary | ICD-10-CM

## 2011-07-02 ENCOUNTER — Ambulatory Visit (HOSPITAL_COMMUNITY)
Admission: RE | Admit: 2011-07-02 | Discharge: 2011-07-02 | Disposition: A | Discharge status: Home / Self Care | Payer: Medicare Other | Source: Ambulatory Visit | Attending: Urology | Admitting: Urology

## 2011-07-02 DIAGNOSIS — Q619 Cystic kidney disease, unspecified: Secondary | ICD-10-CM | POA: Insufficient documentation

## 2011-07-02 DIAGNOSIS — K7689 Other specified diseases of liver: Secondary | ICD-10-CM | POA: Insufficient documentation

## 2011-07-02 DIAGNOSIS — D49519 Neoplasm of unspecified behavior of unspecified kidney: Secondary | ICD-10-CM

## 2011-07-02 DIAGNOSIS — N289 Disorder of kidney and ureter, unspecified: Secondary | ICD-10-CM | POA: Insufficient documentation

## 2011-07-02 LAB — CREATININE, SERUM: Creatinine, Ser: 0.81 mg/dL (ref 0.50–1.10)

## 2011-07-02 MED ORDER — GADOBENATE DIMEGLUMINE 529 MG/ML IV SOLN
14.0000 mL | Freq: Once | INTRAVENOUS | Status: AC | PRN
  Administered 2011-07-02: 14 mL via INTRAVENOUS

## 2011-07-15 ENCOUNTER — Encounter: Payer: Self-pay | Admitting: Obstetrics and Gynecology

## 2011-07-15 ENCOUNTER — Ambulatory Visit (INDEPENDENT_AMBULATORY_CARE_PROVIDER_SITE_OTHER): Payer: Medicare Other | Admitting: Obstetrics and Gynecology

## 2011-07-15 DIAGNOSIS — E119 Type 2 diabetes mellitus without complications: Secondary | ICD-10-CM

## 2011-07-15 DIAGNOSIS — E785 Hyperlipidemia, unspecified: Secondary | ICD-10-CM

## 2011-07-15 DIAGNOSIS — M1990 Unspecified osteoarthritis, unspecified site: Secondary | ICD-10-CM | POA: Insufficient documentation

## 2011-07-15 DIAGNOSIS — K5900 Constipation, unspecified: Secondary | ICD-10-CM

## 2011-07-15 DIAGNOSIS — R109 Unspecified abdominal pain: Secondary | ICD-10-CM

## 2011-07-15 DIAGNOSIS — E663 Overweight: Secondary | ICD-10-CM

## 2011-07-15 DIAGNOSIS — M129 Arthropathy, unspecified: Secondary | ICD-10-CM

## 2011-07-15 DIAGNOSIS — Z90710 Acquired absence of both cervix and uterus: Secondary | ICD-10-CM

## 2011-07-15 DIAGNOSIS — N736 Female pelvic peritoneal adhesions (postinfective): Secondary | ICD-10-CM

## 2011-07-15 NOTE — Progress Notes (Addendum)
Vag. Discharge:no Odor:no Fever:no Irreg.Periods:no Dyspareunia:no Dysuria:no Frequency:no Urgency:no Hematuria:no Kidney stones:no Constipation:no Diarrhea:no Rectal Bleeding: no Vomiting:no Nausea:no Pregnant:no Fibroids:no Endometriosis:no Hx of Ovarian Cyst:no Hx IUD:no Hx STD-PID:no Appendectomy:no Gall Bladder Dz:no  Valerie Sullivan is a 71 y.o. year old female,G1P1, who presents for a problem visit.  Subjective:  The patient presents complaining of left lower quadrant pain.  She has had this pain for greater than 3 years.  Her evaluation has included negative urine cultures, negative ultrasound, and a negative pelvic CT scan.  A recent MRI showed a 1.5 cm lesion in her right kidney.  The patient had a hysterectomy in 1981.  At that time she was told she had pelvic adhesions.  Her ovaries were left in place.  She also has a history of constipation. She has been referred to the pain clinic in the past for management.  Objective:  BP 130/72  Pulse 80  Temp 98 F (36.7 C)  Ht 5' 2.5" (1.588 m)  Wt 155 lb (70.308 kg)  BMI 27.90 kg/m2   General: alert, cooperative and no distress Resp: clear to auscultation bilaterally Cardio: regular rate and rhythm, S1, S2 normal, no murmur, click, rub or gallop GI: soft, no masses, normal bowel sounds, the patient describes tenderness in the left lower quadrant.  External genitalia: Normal Vagina: Atrophic, relaxation noted Cervix: Absent Uterus: Absent Adnexa: No masses appreciated  Assessment:  Left lower quadrant pain of long-standing. Constipation 1.5 cm mass in the right kidney History of pelvic adhesions  Plan:  The patient will be referred back to her primary physician and back to her urologist.  The patient has had a negative pelvic CT and ultrasound in the past.  I'm assuming that her discomfort is coming from pelvic adhesions.  I do not believe that it is appropriate to perform surgery in such a situation  because of the likelihood of return of the adhesions and because it is unlikely that her pain will be relieved in my opinion.   Dr. Caron Presume and Dr. Brunilda Payor will contact the patient to the followup that they request.  Return to office prn if symptoms worsen or fail to improve.   Leonard Schwartz M.D.  07/15/2011 6:41 PM

## 2011-07-15 NOTE — Patient Instructions (Signed)

## 2011-08-13 ENCOUNTER — Telehealth: Payer: Self-pay | Admitting: Obstetrics and Gynecology

## 2011-08-13 NOTE — Telephone Encounter (Signed)
Triage/epic 

## 2011-11-05 ENCOUNTER — Other Ambulatory Visit: Payer: Self-pay | Admitting: Neurology

## 2011-11-05 DIAGNOSIS — R269 Unspecified abnormalities of gait and mobility: Secondary | ICD-10-CM

## 2011-11-05 DIAGNOSIS — M7918 Myalgia, other site: Secondary | ICD-10-CM

## 2011-11-05 DIAGNOSIS — S060X9A Concussion with loss of consciousness of unspecified duration, initial encounter: Secondary | ICD-10-CM

## 2011-11-06 ENCOUNTER — Ambulatory Visit
Admission: RE | Admit: 2011-11-06 | Discharge: 2011-11-06 | Disposition: A | Discharge status: Home / Self Care | Payer: Medicare Other | Source: Ambulatory Visit | Attending: Neurology | Admitting: Neurology

## 2011-11-06 DIAGNOSIS — R269 Unspecified abnormalities of gait and mobility: Secondary | ICD-10-CM

## 2011-11-06 DIAGNOSIS — S060X9A Concussion with loss of consciousness of unspecified duration, initial encounter: Secondary | ICD-10-CM

## 2011-11-08 ENCOUNTER — Ambulatory Visit
Admission: RE | Admit: 2011-11-08 | Discharge: 2011-11-08 | Disposition: A | Discharge status: Home / Self Care | Payer: Medicare Other | Source: Ambulatory Visit | Attending: Neurology | Admitting: Neurology

## 2011-11-08 DIAGNOSIS — R269 Unspecified abnormalities of gait and mobility: Secondary | ICD-10-CM

## 2011-11-08 DIAGNOSIS — M7918 Myalgia, other site: Secondary | ICD-10-CM

## 2011-11-08 MED ORDER — GADOBENATE DIMEGLUMINE 529 MG/ML IV SOLN
14.0000 mL | Freq: Once | INTRAVENOUS | Status: AC | PRN
  Administered 2011-11-08: 14 mL via INTRAVENOUS

## 2011-11-09 ENCOUNTER — Other Ambulatory Visit (HOSPITAL_COMMUNITY): Payer: Self-pay | Admitting: Urology

## 2011-11-09 DIAGNOSIS — D49519 Neoplasm of unspecified behavior of unspecified kidney: Secondary | ICD-10-CM

## 2011-11-13 ENCOUNTER — Emergency Department (HOSPITAL_COMMUNITY)
Admission: EM | Admit: 2011-11-13 | Discharge: 2011-11-13 | Disposition: A | Discharge status: Home / Self Care | Payer: Medicare Other | Attending: Emergency Medicine | Admitting: Emergency Medicine

## 2011-11-13 ENCOUNTER — Emergency Department (HOSPITAL_COMMUNITY): Payer: Medicare Other

## 2011-11-13 ENCOUNTER — Encounter (HOSPITAL_COMMUNITY): Payer: Self-pay

## 2011-11-13 DIAGNOSIS — Z79899 Other long term (current) drug therapy: Secondary | ICD-10-CM | POA: Insufficient documentation

## 2011-11-13 DIAGNOSIS — G8929 Other chronic pain: Secondary | ICD-10-CM | POA: Insufficient documentation

## 2011-11-13 DIAGNOSIS — F3289 Other specified depressive episodes: Secondary | ICD-10-CM | POA: Insufficient documentation

## 2011-11-13 DIAGNOSIS — F411 Generalized anxiety disorder: Secondary | ICD-10-CM | POA: Insufficient documentation

## 2011-11-13 DIAGNOSIS — R109 Unspecified abdominal pain: Secondary | ICD-10-CM

## 2011-11-13 DIAGNOSIS — N390 Urinary tract infection, site not specified: Secondary | ICD-10-CM | POA: Insufficient documentation

## 2011-11-13 DIAGNOSIS — F329 Major depressive disorder, single episode, unspecified: Secondary | ICD-10-CM | POA: Insufficient documentation

## 2011-11-13 DIAGNOSIS — R10814 Left lower quadrant abdominal tenderness: Secondary | ICD-10-CM | POA: Insufficient documentation

## 2011-11-13 LAB — URINALYSIS, ROUTINE W REFLEX MICROSCOPIC
Nitrite: NEGATIVE
Specific Gravity, Urine: 1.018 (ref 1.005–1.030)
pH: 5.5 (ref 5.0–8.0)

## 2011-11-13 LAB — COMPREHENSIVE METABOLIC PANEL
Albumin: 4.1 g/dL (ref 3.5–5.2)
Alkaline Phosphatase: 69 U/L (ref 39–117)
BUN: 15 mg/dL (ref 6–23)
Potassium: 4.1 mEq/L (ref 3.5–5.1)
Sodium: 140 mEq/L (ref 135–145)
Total Protein: 7.2 g/dL (ref 6.0–8.3)

## 2011-11-13 LAB — CBC WITH DIFFERENTIAL/PLATELET
Basophils Absolute: 0 10*3/uL (ref 0.0–0.1)
Basophils Relative: 0 % (ref 0–1)
Eosinophils Absolute: 0.1 10*3/uL (ref 0.0–0.7)
MCH: 32.9 pg (ref 26.0–34.0)
MCHC: 34.9 g/dL (ref 30.0–36.0)
Monocytes Relative: 5 % (ref 3–12)
Neutrophils Relative %: 70 % (ref 43–77)
Platelets: 151 10*3/uL (ref 150–400)
RDW: 12.7 % (ref 11.5–15.5)

## 2011-11-13 LAB — URINE MICROSCOPIC-ADD ON

## 2011-11-13 LAB — LIPASE, BLOOD: Lipase: 15 U/L (ref 11–59)

## 2011-11-13 MED ORDER — CEPHALEXIN 500 MG PO CAPS: 500.0000 mg | ORAL_CAPSULE | Freq: Four times a day (QID) | ORAL | Status: DC

## 2011-11-13 MED ORDER — IOHEXOL 300 MG/ML  SOLN
100.0000 mL | Freq: Once | INTRAMUSCULAR | Status: AC | PRN
  Administered 2011-11-13: 100 mL via INTRAVENOUS

## 2011-11-13 MED ORDER — DOCUSATE SODIUM 250 MG PO CAPS: 250.0000 mg | ORAL_CAPSULE | Freq: Every day | ORAL | Status: AC

## 2011-11-13 MED ORDER — IOHEXOL 300 MG/ML  SOLN
20.0000 mL | INTRAMUSCULAR | Status: AC
  Administered 2011-11-13 (×2): 20 mL via ORAL

## 2011-11-13 MED ORDER — HYDROMORPHONE HCL PF 1 MG/ML IJ SOLN
1.0000 mg | Freq: Once | INTRAMUSCULAR | Status: AC
  Administered 2011-11-13: 1 mg via INTRAVENOUS
  Filled 2011-11-13: qty 1

## 2011-11-13 MED ORDER — DEXTROSE 5 % IV SOLN
1.0000 g | Freq: Once | INTRAVENOUS | Status: AC
  Administered 2011-11-13: 1 g via INTRAVENOUS
  Filled 2011-11-13 (×2): qty 10

## 2011-11-13 MED ORDER — OXYCODONE-ACETAMINOPHEN 5-325 MG PO TABS: 2.0000 | ORAL_TABLET | ORAL | Status: AC | PRN

## 2011-11-13 MED ORDER — CEPHALEXIN 500 MG PO CAPS: 250.0000 mg | ORAL_CAPSULE | Freq: Four times a day (QID) | ORAL | Status: AC

## 2011-11-13 MED ORDER — SODIUM CHLORIDE 0.9 % IV SOLN
Freq: Once | INTRAVENOUS | Status: AC
  Administered 2011-11-13: 18:00:00 via INTRAVENOUS

## 2011-11-13 NOTE — ED Provider Notes (Addendum)
History     CSN: [id number]  Arrival date & time 11/13/11  1115   First MD Initiated Contact with Patient 11/13/11 1525      Chief Complaint  Patient presents with  . Abdominal Pain    (Consider location/radiation/quality/duration/timing/severity/associated sxs/prior treatment) HPI Comments: Mrs. Sova presents c/o severe pain.  She reports having 10/10 left lower abdominal pain.  She states that she has been having this pain for quite some time.  She reports that she has had an ultrasound and exam by her Gyn provider and repeated evaluations by her PMD without any relief or cause for this pain.  She denies nausea, vomiting, diarrhea, constipation, fever, wt loss, vaginal bleeding or discharge.  She reports chronic neck and lower back pain also.   Patient is a 71 y.o. female presenting with abdominal pain. The history is provided by the patient. No language interpreter was used.  Abdominal Pain The primary symptoms of the illness include abdominal pain. The primary symptoms of the illness do not include fever, fatigue, shortness of breath, nausea, vomiting, diarrhea, hematemesis, hematochezia, dysuria or vaginal discharge. The current episode started more than 2 days ago. The onset of the illness was gradual. The problem has not changed since onset. The patient states that she believes she is currently not pregnant. The patient has not had a change in bowel habit. Risk factors for an acute abdominal problem include being elderly. Additional symptoms associated with the illness include back pain. Symptoms associated with the illness do not include chills, anorexia, diaphoresis, heartburn, constipation, urgency, hematuria or frequency. Significant associated medical issues include diabetes. Significant associated medical issues do not include PUD, GERD, inflammatory bowel disease, sickle cell disease, gallstones, liver disease, substance abuse, diverticulitis, HIV or cardiac disease.    Past  Medical History  Diagnosis Date  . Constipation   . Pelvic adhesions   . Diabetes mellitus   . Hypertension     Past Surgical History  Procedure Date  . Partial hysterectomy     Family History  Problem Relation Age of Onset  . Cancer Father     bone  . Heart disease Mother     History  Substance Use Topics  . Smoking status: Never Smoker   . Smokeless tobacco: Not on file  . Alcohol Use: No    OB History    Grav Para Term Preterm Abortions TAB SAB Ect Mult Living   1 1        1       Review of Systems  Constitutional: Negative for fever, chills, diaphoresis and fatigue.  HENT: Negative.   Eyes: Negative.   Respiratory: Negative for shortness of breath.   Gastrointestinal: Positive for abdominal pain. Negative for heartburn, nausea, vomiting, diarrhea, constipation, hematochezia, anorexia and hematemesis.  Genitourinary: Negative for dysuria, urgency, frequency, hematuria and vaginal discharge.  Musculoskeletal: Positive for back pain.  Neurological: Negative.   Psychiatric/Behavioral: Negative.     Allergies  Review of patient's allergies indicates no known allergies.  Home Medications   Current Outpatient Rx  Name Route Sig Dispense Refill  . ASPIRIN 81 MG PO TABS Oral Take 81 mg by mouth daily.    . ATORVASTATIN CALCIUM 20 MG PO TABS Oral Take 20 mg by mouth daily.    . CELECOXIB 200 MG PO CAPS Oral Take 200 mg by mouth 2 (two) times daily.    Marland Kitchen CITALOPRAM HYDROBROMIDE 20 MG PO TABS Oral Take 20 mg by mouth daily.    Marland Kitchen NAPROXEN  SODIUM 220 MG PO TABS Oral Take 220 mg by mouth 2 (two) times daily with a meal.    . OMEPRAZOLE 20 MG PO CPDR Oral Take 20 mg by mouth daily.    . OXYCODONE HCL 5 MG PO TABS Oral Take 5 mg by mouth every 4 (four) hours as needed. For pain    . VALSARTAN-HYDROCHLOROTHIAZIDE 160-12.5 MG PO TABS Oral Take 1 tablet by mouth daily.    Marland Kitchen VITAMIN D (ERGOCALCIFEROL) 50000 UNITS PO CAPS Oral Take 50,000 Units by mouth every 7 (seven) days.  sunday      BP 122/65  Pulse 74  Temp 98 F (36.7 C) (Oral)  Resp 18  Ht 5\' 2"  (1.575 m)  Wt 151 lb (68.493 kg)  BMI 27.62 kg/m2  SpO2 98%  Physical Exam  Nursing note and vitals reviewed. Constitutional: She is oriented to person, place, and time. She appears well-developed and well-nourished. No distress.  HENT:  Head: Normocephalic and atraumatic.  Right Ear: External ear normal.  Left Ear: External ear normal.  Nose: Nose normal.  Mouth/Throat: Oropharynx is clear and moist. No oropharyngeal exudate.  Eyes: Conjunctivae and EOM are normal. Pupils are equal, round, and reactive to light. Right eye exhibits no discharge. Left eye exhibits no discharge. No scleral icterus.  Neck: Trachea normal, normal range of motion and phonation normal. Neck supple. Normal carotid pulses and no JVD present. Muscular tenderness present. No tracheal tenderness and no spinous process tenderness present. Carotid bruit is not present. No rigidity. No tracheal deviation, no edema, no erythema and normal range of motion present. No mass and no thyromegaly present.  Cardiovascular: Normal rate, regular rhythm, normal heart sounds and intact distal pulses.  Exam reveals no gallop and no friction rub.   No murmur heard. Pulmonary/Chest: Effort normal and breath sounds normal. No stridor. No respiratory distress. She has no wheezes. She has no rales. She exhibits no tenderness.  Abdominal: Soft. Bowel sounds are normal. She exhibits no shifting dullness, no distension, no abdominal bruit, no ascites, no pulsatile midline mass and no mass. There is no hepatosplenomegaly. There is tenderness in the left lower quadrant. There is guarding. There is no rigidity, no rebound, no CVA tenderness, no tenderness at McBurney's point and negative Murphy's sign. No hernia.    Musculoskeletal: Normal range of motion. She exhibits no edema and no tenderness.  Lymphadenopathy:    She has no cervical adenopathy.    Neurological: She is alert and oriented to person, place, and time. No cranial nerve deficit. She exhibits normal muscle tone. Coordination normal.  Skin: Skin is warm and dry. No rash noted. She is not diaphoretic. No erythema. No pallor.  Psychiatric: Her behavior is normal. Judgment and thought content normal. Her mood appears anxious. Her affect is not angry, not blunt, not labile and not inappropriate. Her speech is not rapid and/or pressured, not delayed and not slurred. She is not agitated, not aggressive, is not hyperactive, not slowed, not withdrawn, not actively hallucinating and not combative. Thought content is not paranoid and not delusional. Cognition and memory are normal. Cognition and memory are not impaired. She does not express impulsivity or inappropriate judgment. She exhibits a depressed mood. She expresses no homicidal and no suicidal ideation. She is communicative. She exhibits normal recent memory and normal remote memory. She is attentive.    ED Course  Procedures (including critical care time)  Labs Reviewed  CBC WITH DIFFERENTIAL - Abnormal; Notable for the following:  RBC 3.59 (*)     Hemoglobin 11.8 (*)     HCT 33.8 (*)     All other components within normal limits  COMPREHENSIVE METABOLIC PANEL - Abnormal; Notable for the following:    Glucose, Bld 118 (*)     GFR calc non Af Amer 82 (*)     All other components within normal limits  URINALYSIS, ROUTINE W REFLEX MICROSCOPIC - Abnormal; Notable for the following:    APPearance HAZY (*)     Bilirubin Urine SMALL (*)     Leukocytes, UA MODERATE (*)     All other components within normal limits  LIPASE, BLOOD  URINE MICROSCOPIC-ADD ON  URINE CULTURE   No results found.   No diagnosis found.    MDM  Pt presents for evaluation of pain.  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  She reports having recurrent CT scans and MRIs but never being given a  diagnosis.  She states there is no change in the character of her discomfort today but, "I couldn't take it no more".  She appears depressed and nontoxic with stable VS but reproducible LLQ pain on exam - NAD.  Belly labs performed in triage reveals no leukocytosis, mild anemia, nl electrolytes and renal function, and a UTI.  Urine culture ordered.  Plan symptomatic mgmnt, may consider a CT scan of abd after reviewing available notes regarding previous evaluations and imaging.  Pt does not describe symptoms consistent with colitis or diverticulitis.  She also denies dysuria.  1715.  Pt stable NAD, cont LLQ pain.  CT ordered for further evaluation.  Plan move to CDU pending CT scan.  1950.  Pt stable, NAD.  CT pending.  Plan move to CDU pending CT scan.      Tobin Chad, MD 11/13/11 1719  Tobin Chad, MD 11/13/11 1610

## 2011-11-13 NOTE — ED Notes (Signed)
Pt sts been seen for same by several docs over the past 1-2 months.

## 2011-11-13 NOTE — ED Notes (Signed)
Patient transported to CT 

## 2011-11-13 NOTE — ED Notes (Signed)
Discussed patient's plan of care and d/c instructions with paitent and husband.  Patient's husband was very agitated and anxious.  Patient is stable and wanting to go home.

## 2011-11-13 NOTE — ED Provider Notes (Signed)
9:03 PM BP 133/66  Pulse 69  Temp 98 F (36.7 C) (Oral)  Resp 16  Ht 5\' 2"  (1.575 m)  Wt 151 lb (68.493 kg)  BMI 27.62 kg/m2  SpO2 100% Assumed care of patient. She has a history of chronic abdominal pain. Presents today with left lower quadrant pain is probably evaluated several times previously. Revealed urinary tract infection, but were otherwise unremarkable. She was given 1 g of Rocephin IV. Currently awaiting results on her CT of the abdomen.  10:05 PM Patient is frustrated and distraught about seeing Doctors without any answers.  I expressed my concern for her frustration, however, explain to her that this is a non-emergent situation, and there's not much more that we can do for her here in the emergency department. Will have her followup with her nephrologist as renal mass was the only finding on exam.  11:06 PM Spoke with the patient and her husband about office for followup. The was to followup with the gastroenterologist for further workup. I have made a reference to Dr. Ewing Schlein. I've offered also given the patient. Several days worth of Percocet for pain control until she can follow up with her primary care provider. Patient will be discharged on Keflex 250 4 times a day for 10 days for her urinary tract infection. She may follow up with her primary care physician regarding continued symptoms. I've asked discussed reasons to return immediately. Patient understands and agrees with plan.  Arthor Captain, PA-C 11/13/11 2307

## 2011-11-13 NOTE — ED Notes (Signed)
Patient's spouse was notified that patient's Rx for her keflex was called in to her Pharmacy at [facility] on [address].  Called and spoke directly to Mr.  Grajeda

## 2011-11-13 NOTE — ED Notes (Signed)
Pt presents with LLQ abdominal pain that radiates around to her back "for months".  Pt reports she has been seen multiple times for same.  Pt reports pain is worse today, reports weakness to arms and shoulders.  Pt denies any nausea, vomiting or diarrhea.  Last bowel movement x 2-3 days ago.  Pt denies any dysuria.

## 2011-11-13 NOTE — ED Notes (Signed)
IV team attempted IV x 2 without success.  2nd IV RN to come assess.

## 2011-11-13 NOTE — ED Notes (Addendum)
Patient and family were reminded about metformin and to hold it per order via CT scan. Per Radiologist, patient to hold metformin for 48 hours.  Patient and spouse were able to explained the reason that patient is to hold medication.

## 2011-11-14 LAB — URINE CULTURE

## 2011-11-14 NOTE — ED Provider Notes (Signed)
Medical screening examination/treatment/procedure(s) were performed by non-physician practitioner and as supervising physician I was immediately available for consultation/collaboration.  Tobin Chad, MD 11/14/11 [phone]

## 2011-11-27 ENCOUNTER — Ambulatory Visit: Payer: Medicare Other | Admitting: Rehabilitative and Restorative Service Providers"

## 2011-12-03 ENCOUNTER — Ambulatory Visit: Payer: Medicare Other | Attending: Neurology | Admitting: Physical Therapy

## 2011-12-03 DIAGNOSIS — M6281 Muscle weakness (generalized): Secondary | ICD-10-CM | POA: Insufficient documentation

## 2011-12-03 DIAGNOSIS — R269 Unspecified abnormalities of gait and mobility: Secondary | ICD-10-CM | POA: Insufficient documentation

## 2011-12-03 DIAGNOSIS — IMO0001 Reserved for inherently not codable concepts without codable children: Secondary | ICD-10-CM | POA: Insufficient documentation

## 2011-12-09 ENCOUNTER — Ambulatory Visit: Payer: Medicare Other | Admitting: *Deleted

## 2011-12-11 ENCOUNTER — Ambulatory Visit: Payer: Medicare Other | Admitting: *Deleted

## 2011-12-14 ENCOUNTER — Ambulatory Visit: Payer: Medicare Other | Admitting: Physical Therapy

## 2011-12-15 ENCOUNTER — Ambulatory Visit (HOSPITAL_COMMUNITY)
Admission: RE | Admit: 2011-12-15 | Discharge: 2011-12-15 | Disposition: A | Discharge status: Home / Self Care | Payer: Medicare Other | Source: Ambulatory Visit | Attending: Urology | Admitting: Urology

## 2011-12-15 DIAGNOSIS — D49519 Neoplasm of unspecified behavior of unspecified kidney: Secondary | ICD-10-CM

## 2011-12-15 DIAGNOSIS — K7689 Other specified diseases of liver: Secondary | ICD-10-CM | POA: Insufficient documentation

## 2011-12-15 DIAGNOSIS — D4959 Neoplasm of unspecified behavior of other genitourinary organ: Secondary | ICD-10-CM | POA: Insufficient documentation

## 2011-12-15 MED ORDER — GADOBENATE DIMEGLUMINE 529 MG/ML IV SOLN
14.0000 mL | Freq: Once | INTRAVENOUS | Status: AC | PRN
  Administered 2011-12-15: 14 mL via INTRAVENOUS

## 2011-12-17 ENCOUNTER — Ambulatory Visit: Payer: Medicare Other | Admitting: *Deleted

## 2011-12-22 ENCOUNTER — Ambulatory Visit: Payer: Medicare Other | Admitting: Physical Therapy

## 2011-12-24 ENCOUNTER — Ambulatory Visit: Payer: Medicare Other | Admitting: Physical Therapy

## 2011-12-29 ENCOUNTER — Ambulatory Visit: Payer: Medicare Other | Admitting: Physical Therapy

## 2011-12-31 ENCOUNTER — Ambulatory Visit: Payer: Medicare Other | Admitting: Physical Therapy

## 2012-01-05 ENCOUNTER — Ambulatory Visit: Payer: Medicare Other | Admitting: Physical Therapy

## 2012-01-07 ENCOUNTER — Ambulatory Visit: Payer: Medicare Other | Admitting: Physical Therapy

## 2012-03-25 ENCOUNTER — Emergency Department (HOSPITAL_COMMUNITY)
Admission: EM | Admit: 2012-03-25 | Discharge: 2012-03-26 | Disposition: A | Discharge status: Home / Self Care | Payer: Medicare Other | Attending: Emergency Medicine | Admitting: Emergency Medicine

## 2012-03-25 ENCOUNTER — Encounter (HOSPITAL_COMMUNITY): Payer: Self-pay | Admitting: Emergency Medicine

## 2012-03-25 DIAGNOSIS — E119 Type 2 diabetes mellitus without complications: Secondary | ICD-10-CM | POA: Insufficient documentation

## 2012-03-25 DIAGNOSIS — Z90710 Acquired absence of both cervix and uterus: Secondary | ICD-10-CM | POA: Insufficient documentation

## 2012-03-25 DIAGNOSIS — G8929 Other chronic pain: Secondary | ICD-10-CM | POA: Insufficient documentation

## 2012-03-25 DIAGNOSIS — Z8719 Personal history of other diseases of the digestive system: Secondary | ICD-10-CM | POA: Insufficient documentation

## 2012-03-25 DIAGNOSIS — Z7982 Long term (current) use of aspirin: Secondary | ICD-10-CM | POA: Insufficient documentation

## 2012-03-25 DIAGNOSIS — I10 Essential (primary) hypertension: Secondary | ICD-10-CM | POA: Insufficient documentation

## 2012-03-25 DIAGNOSIS — Z79899 Other long term (current) drug therapy: Secondary | ICD-10-CM | POA: Insufficient documentation

## 2012-03-25 DIAGNOSIS — R1032 Left lower quadrant pain: Secondary | ICD-10-CM | POA: Insufficient documentation

## 2012-03-25 LAB — COMPREHENSIVE METABOLIC PANEL
Albumin: 3.7 g/dL (ref 3.5–5.2)
Alkaline Phosphatase: 65 U/L (ref 39–117)
BUN: 10 mg/dL (ref 6–23)
CO2: 26 mEq/L (ref 19–32)
Chloride: 103 mEq/L (ref 96–112)
Creatinine, Ser: 0.75 mg/dL (ref 0.50–1.10)
GFR calc Af Amer: >90 mL/min (ref >90)
GFR calc non Af Amer: 83 mL/min — ABNORMAL LOW (ref >90)
Glucose, Bld: 104 mg/dL — ABNORMAL HIGH (ref 70–99)
Potassium: 3.5 mEq/L (ref 3.5–5.1)
Sodium: 137 mEq/L (ref 135–145)
Total Bilirubin: 0.4 mg/dL (ref 0.3–1.2)

## 2012-03-25 LAB — CBC WITH DIFFERENTIAL/PLATELET
Basophils Absolute: 0 10*3/uL (ref 0.0–0.1)
Eosinophils Absolute: 0.1 10*3/uL (ref 0.0–0.7)
Eosinophils Relative: 2 % (ref 0–5)
Lymphocytes Relative: 38 % (ref 12–46)
MCH: 32.4 pg (ref 26.0–34.0)
MCV: 93.2 fL (ref 78.0–100.0)
Neutrophils Relative %: 53 % (ref 43–77)
Platelets: 164 10*3/uL (ref 150–400)
RDW: 11.9 % (ref 11.5–15.5)
WBC: 7.2 10*3/uL (ref 4.0–10.5)

## 2012-03-25 LAB — URINALYSIS, MICROSCOPIC ONLY
Bilirubin Urine: NEGATIVE
Nitrite: NEGATIVE
Protein, ur: NEGATIVE mg/dL
Urobilinogen, UA: 1 mg/dL (ref 0.0–1.0)

## 2012-03-25 LAB — LIPASE, BLOOD: Lipase: 14 U/L (ref 11–59)

## 2012-03-25 MED ORDER — OXYCODONE-ACETAMINOPHEN 5-325 MG PO TABS
1.0000 | ORAL_TABLET | Freq: Once | ORAL | Status: AC
  Administered 2012-03-25: 1 via ORAL
  Filled 2012-03-25: qty 1

## 2012-03-25 MED ORDER — FENTANYL CITRATE 0.05 MG/ML IJ SOLN
100.0000 ug | Freq: Once | INTRAMUSCULAR | Status: AC
  Administered 2012-03-25: 100 ug via NASAL
  Filled 2012-03-25: qty 2

## 2012-03-25 MED ORDER — OXYCODONE HCL 5 MG PO TABS: 5.0000 mg | ORAL_TABLET | ORAL | Status: DC | PRN

## 2012-03-25 NOTE — ED Notes (Signed)
Pt presenting to ed with c/o left side abdominal pain x couple months. Pt states pain is getting worse. Pt denies nausea, vomiting and diarrhea at this time.

## 2012-03-25 NOTE — ED Provider Notes (Signed)
History     CSN: [id number]  Arrival date & time 03/25/12  1647   First MD Initiated Contact with Patient 03/25/12 1914      Chief Complaint  Patient presents with  . Abdominal Pain    (Consider location/radiation/quality/duration/timing/severity/associated sxs/prior treatment) HPI 72 year old female has chronic left lower quadrant abdominal pain present 24 hours a day for several months with unknown etiology despite evaluations by GI, gynecology, her primary care doctor, and his head CT scans and MRI and other evaluations for the same problem. She is taking oxycodone without relief of her pain. She presents frustrated because nobody can tell her why she has chronic pain or how to treat it. Past Medical History  Diagnosis Date  . Constipation   . Pelvic adhesions   . Diabetes mellitus   . Hypertension     Past Surgical History  Procedure Date  . Partial hysterectomy     Family History  Problem Relation Age of Onset  . Cancer Father     bone  . Heart disease Mother     History  Substance Use Topics  . Smoking status: Never Smoker   . Smokeless tobacco: Not on file  . Alcohol Use: No    OB History    Grav Para Term Preterm Abortions TAB SAB Ect Mult Living   1 1        1       Review of Systems 10 Systems reviewed and are negative for acute change except as noted in the HPI. Allergies  Review of patient's allergies indicates no known allergies.  Home Medications   Current Outpatient Rx  Name  Route  Sig  Dispense  Refill  . ASPIRIN 81 MG PO TABS   Oral   Take 81 mg by mouth daily.         . ATORVASTATIN CALCIUM 20 MG PO TABS   Oral   Take 20 mg by mouth at bedtime.          . CELECOXIB 200 MG PO CAPS   Oral   Take 200 mg by mouth 2 (two) times daily.         Marland Kitchen CITALOPRAM HYDROBROMIDE 20 MG PO TABS   Oral   Take 20 mg by mouth daily.         Marland Kitchen NAPROXEN SODIUM 220 MG PO TABS   Oral   Take 220 mg by mouth 2 (two) times daily with a  meal.         . OMEPRAZOLE 20 MG PO CPDR   Oral   Take 20 mg by mouth daily.         Marland Kitchen VALSARTAN-HYDROCHLOROTHIAZIDE 160-12.5 MG PO TABS   Oral   Take 1 tablet by mouth daily.         Marland Kitchen VITAMIN D (ERGOCALCIFEROL) 50000 UNITS PO CAPS   Oral   Take 50,000 Units by mouth every 7 (seven) days. sunday         . OXYCODONE HCL 5 MG PO TABS   Oral   Take 1 tablet (5 mg total) by mouth every 4 (four) hours as needed. For pain   15 tablet   0     BP 136/81  Pulse 81  Temp 98.1 F (36.7 C) (Oral)  Resp 17  SpO2 97%  Physical Exam  Nursing note and vitals reviewed. Constitutional:       Awake, alert, nontoxic appearance.  HENT:  Head: Atraumatic.  Eyes: Right eye exhibits no  discharge. Left eye exhibits no discharge.  Neck: Neck supple.  Cardiovascular: Normal rate and regular rhythm.   No murmur heard. Pulmonary/Chest: Effort normal and breath sounds normal. No respiratory distress. She has no wheezes. She has no rales. She exhibits no tenderness.  Abdominal: Soft. Bowel sounds are normal. She exhibits no distension and no mass. There is tenderness. There is no rebound and no guarding.       Mild left lower quadrant tenderness without CVA tenderness  Musculoskeletal: She exhibits no edema and no tenderness.       Baseline ROM, no obvious new focal weakness.  Neurological: She is alert.       Mental status and motor strength appears baseline for patient and situation.  Skin: No rash noted.  Psychiatric: She has a normal mood and affect.    ED Course  Procedures (including critical care time) The gentleman in the room with the patient during the examination is angry and repeatedly swearing and often very difficult to understand despite my best efforts to understand his concerns and the patient's condition.  Nursing staff noticed the same behavior.  Patient now states that she ran out of her oxycodone which he uses for chronic left lower quadrant abdominal pain.  Patient admits she has chronic abdominal pain despite multiple evaluations by multiple providers and multiple diagnostic attempts in the past. I do not think the patient needs imaging today she understands and agrees with this assessment and plan as well. Labs Reviewed  CBC WITH DIFFERENTIAL - Abnormal; Notable for the following:    RBC 3.55 (*)     Hemoglobin 11.5 (*)     HCT 33.1 (*)     All other components within normal limits  COMPREHENSIVE METABOLIC PANEL - Abnormal; Notable for the following:    Glucose, Bld 104 (*)     GFR calc non Af Amer 83 (*)     All other components within normal limits  URINALYSIS, MICROSCOPIC ONLY - Abnormal; Notable for the following:    Ketones, ur TRACE (*)     Leukocytes, UA SMALL (*)     All other components within normal limits  GLUCOSE, CAPILLARY - Abnormal; Notable for the following:    Glucose-Capillary 119 (*)     All other components within normal limits  LIPASE, BLOOD  LAB REPORT - SCANNED   No results found.   1. Chronic abdominal pain       MDM  Pt stable in ED with no significant deterioration in condition.Patient informed of clinical course, understand medical decision-making process, and agree with plan.  I doubt any other EMC precluding discharge at this time including, but not necessarily limited to the following: SBI, peritonitis.         Hurman Horn, MD 03/26/12 [phone]

## 2012-04-18 ENCOUNTER — Emergency Department (HOSPITAL_COMMUNITY)
Admission: EM | Admit: 2012-04-18 | Discharge: 2012-04-18 | Disposition: A | Discharge status: Home / Self Care | Payer: Medicare Other | Attending: Emergency Medicine | Admitting: Emergency Medicine

## 2012-04-18 ENCOUNTER — Encounter (HOSPITAL_COMMUNITY): Payer: Self-pay | Admitting: *Deleted

## 2012-04-18 DIAGNOSIS — I10 Essential (primary) hypertension: Secondary | ICD-10-CM | POA: Insufficient documentation

## 2012-04-18 DIAGNOSIS — Z8719 Personal history of other diseases of the digestive system: Secondary | ICD-10-CM | POA: Insufficient documentation

## 2012-04-18 DIAGNOSIS — R300 Dysuria: Secondary | ICD-10-CM | POA: Insufficient documentation

## 2012-04-18 DIAGNOSIS — Z79899 Other long term (current) drug therapy: Secondary | ICD-10-CM | POA: Insufficient documentation

## 2012-04-18 DIAGNOSIS — Z90711 Acquired absence of uterus with remaining cervical stump: Secondary | ICD-10-CM | POA: Insufficient documentation

## 2012-04-18 DIAGNOSIS — Z8739 Personal history of other diseases of the musculoskeletal system and connective tissue: Secondary | ICD-10-CM | POA: Insufficient documentation

## 2012-04-18 DIAGNOSIS — E119 Type 2 diabetes mellitus without complications: Secondary | ICD-10-CM | POA: Insufficient documentation

## 2012-04-18 DIAGNOSIS — Z7982 Long term (current) use of aspirin: Secondary | ICD-10-CM | POA: Insufficient documentation

## 2012-04-18 DIAGNOSIS — R109 Unspecified abdominal pain: Secondary | ICD-10-CM

## 2012-04-18 DIAGNOSIS — Z8742 Personal history of other diseases of the female genital tract: Secondary | ICD-10-CM | POA: Insufficient documentation

## 2012-04-18 HISTORY — DX: Unspecified osteoarthritis, unspecified site: M19.90

## 2012-04-18 LAB — CBC WITH DIFFERENTIAL/PLATELET
Eosinophils Absolute: 0.1 10*3/uL (ref 0.0–0.7)
Eosinophils Relative: 1 % (ref 0–5)
Hemoglobin: 12.2 g/dL (ref 12.0–15.0)
Lymphocytes Relative: 23 % (ref 12–46)
Lymphs Abs: 1.8 10*3/uL (ref 0.7–4.0)
MCH: 32.4 pg (ref 26.0–34.0)
MCV: 93.4 fL (ref 78.0–100.0)
Monocytes Relative: 6 % (ref 3–12)
RBC: 3.77 MIL/uL — ABNORMAL LOW (ref 3.87–5.11)
WBC: 8 10*3/uL (ref 4.0–10.5)

## 2012-04-18 LAB — COMPREHENSIVE METABOLIC PANEL
ALT: 19 U/L (ref 0–35)
Alkaline Phosphatase: 75 U/L (ref 39–117)
BUN: 15 mg/dL (ref 6–23)
CO2: 24 mEq/L (ref 19–32)
GFR calc Af Amer: >90 mL/min (ref >90)
GFR calc non Af Amer: 87 mL/min — ABNORMAL LOW (ref >90)
Glucose, Bld: 146 mg/dL — ABNORMAL HIGH (ref 70–99)
Potassium: 3.6 mEq/L (ref 3.5–5.1)
Sodium: 143 mEq/L (ref 135–145)
Total Protein: 7.1 g/dL (ref 6.0–8.3)

## 2012-04-18 LAB — URINALYSIS, ROUTINE W REFLEX MICROSCOPIC
Bilirubin Urine: NEGATIVE
Hgb urine dipstick: NEGATIVE
Ketones, ur: NEGATIVE mg/dL
Nitrite: NEGATIVE
Protein, ur: NEGATIVE mg/dL
Specific Gravity, Urine: 1.019 (ref 1.005–1.030)
Urobilinogen, UA: 1 mg/dL (ref 0.0–1.0)

## 2012-04-18 LAB — LIPASE, BLOOD: Lipase: 13 U/L (ref 11–59)

## 2012-04-18 MED ORDER — OXYCODONE-ACETAMINOPHEN 5-325 MG PO TABS: 1.0000 | ORAL_TABLET | ORAL | Status: DC | PRN

## 2012-04-18 MED ORDER — OXYCODONE HCL 5 MG PO TABS
10.0000 mg | ORAL_TABLET | Freq: Once | ORAL | Status: AC
  Administered 2012-04-18: 10 mg via ORAL
  Filled 2012-04-18: qty 2

## 2012-04-18 NOTE — ED Provider Notes (Signed)
History     CSN: [id number]  Arrival date & time 04/18/12  1226   First MD Initiated Contact with Patient 04/18/12 1419      Chief Complaint  Patient presents with  . Abdominal Pain  . Dysuria     HPI Patient has a history of chronic lower abdominal pain without known etiology.  She presents emergency department today because of lower abdominal pain that was more severe and she was having difficulty sleeping.  No nausea or vomiting.  No diarrhea.  She took her Percocet at home last night with some improvement in her symptoms.  Her pain was still present this morning and therefore she did not take her Percocet because she wanted to come the emergency apartment to see what the cause of her pain was.  She does report some discomfort with urination but no urinary frequency.  There is a question of urinary hesitancy.  She's never been diagnosed with urinary retention.  No melena or hematochezia.   Past Medical History  Diagnosis Date  . Constipation   . Pelvic adhesions   . Diabetes mellitus   . Hypertension   . Arthritis     hips/neck    Past Surgical History  Procedure Date  . Partial hysterectomy     Family History  Problem Relation Age of Onset  . Cancer Father     bone  . Heart disease Mother     History  Substance Use Topics  . Smoking status: Never Smoker   . Smokeless tobacco: Not on file  . Alcohol Use: No    OB History    Grav Para Term Preterm Abortions TAB SAB Ect Mult Living   1 1        1       Review of Systems  All other systems reviewed and are negative.    Allergies  Review of patient's allergies indicates no known allergies.  Home Medications   Current Outpatient Rx  Name  Route  Sig  Dispense  Refill  . ASPIRIN 81 MG PO TABS   Oral   Take 81 mg by mouth daily.         . ATORVASTATIN CALCIUM 20 MG PO TABS   Oral   Take 20 mg by mouth at bedtime.          Marland Kitchen CITALOPRAM HYDROBROMIDE 20 MG PO TABS   Oral   Take 20 mg by mouth  daily.         Marland Kitchen METFORMIN HCL 500 MG PO TABS   Oral   Take 500 mg by mouth daily with breakfast.         . NAPROXEN SODIUM 220 MG PO TABS   Oral   Take 220 mg by mouth 2 (two) times daily as needed. pain         . OMEPRAZOLE 20 MG PO CPDR   Oral   Take 20 mg by mouth daily.          . OXYCODONE HCL 5 MG PO TABS   Oral   Take 1 tablet (5 mg total) by mouth every 4 (four) hours as needed. For pain   15 tablet   0   . SENNA 8.6 MG PO TABS   Oral   Take 2 tablets by mouth daily as needed. As needed for constipation.         Marland Kitchen VALSARTAN-HYDROCHLOROTHIAZIDE 160-12.5 MG PO TABS   Oral   Take 1 tablet by mouth every  morning.          Marland Kitchen VITAMIN D (ERGOCALCIFEROL) 50000 UNITS PO CAPS   Oral   Take 50,000 Units by mouth every 7 (seven) days. Monday         . ZOLPIDEM TARTRATE 10 MG PO TABS   Oral   Take 10 mg by mouth at bedtime as needed. For sleep           BP 154/83  Pulse 85  Temp 98.2 F (36.8 C) (Oral)  Resp 18  SpO2 100%  Physical Exam  Nursing note and vitals reviewed. Constitutional: She is oriented to person, place, and time. She appears well-developed and well-nourished. No distress.  HENT:  Head: Normocephalic and atraumatic.  Eyes: EOM are normal.  Neck: Normal range of motion.  Cardiovascular: Normal rate, regular rhythm and normal heart sounds.   Pulmonary/Chest: Effort normal and breath sounds normal.  Abdominal: Soft. She exhibits no distension. There is no tenderness. There is no rebound and no guarding.  Musculoskeletal: Normal range of motion.  Neurological: She is alert and oriented to person, place, and time.  Skin: Skin is warm and dry.  Psychiatric: She has a normal mood and affect. Judgment normal.    ED Course  Procedures (including critical care time)  Labs Reviewed  CBC WITH DIFFERENTIAL - Abnormal; Notable for the following:    RBC 3.77 (*)     HCT 35.2 (*)     Platelets 148 (*)     All other components within  normal limits  COMPREHENSIVE METABOLIC PANEL - Abnormal; Notable for the following:    Glucose, Bld 146 (*)     GFR calc non Af Amer 87 (*)     All other components within normal limits  LIPASE, BLOOD  URINALYSIS, ROUTINE W REFLEX MICROSCOPIC  URINE CULTURE   No results found.   1. Abdominal pain       MDM  No significant abdominal tenderness on exam.  The patient has had an extensive workup regarding her chronic lower abdominal pain including CT and MRI scan.  The patient reports that last night her pain was more severe and she was having difficulty sleeping.  No nausea or vomiting.  No diarrhea.  She took her Percocet at home last night with some improvement in her symptoms.  Her pain was still present this morning and therefore she did not take her Percocet because she wanted to come the emergency apartment to see what the cause of her pain was.  She does report some discomfort with urination but no urinary frequency.  There is a question of urinary hesitancy.  She's never been diagnosed with urinary retention.        Lyanne Co, MD 04/18/12 [phone]

## 2012-04-18 NOTE — ED Notes (Signed)
C/o lower abd pain, difficulty urinating x 2 years. States feels like she has to void but unable to get it all out & usually only just trickles out. Denies hematuria, dysuria. Denies n/v/d, fever, chills.

## 2012-04-18 NOTE — ED Notes (Addendum)
Pt with hx of LLQ pain of unknown etiology.  This week the pain has been increasing.  Last night pt was unable to sleep d/t the pain.  Pt states difficulty urinating last night. Pt crying.  PT has first apt at pain clinic tomorrow.

## 2012-04-19 LAB — URINE CULTURE: Culture: NO GROWTH

## 2012-04-20 ENCOUNTER — Other Ambulatory Visit: Payer: Self-pay | Admitting: Internal Medicine

## 2012-04-20 DIAGNOSIS — Z1231 Encounter for screening mammogram for malignant neoplasm of breast: Secondary | ICD-10-CM

## 2012-05-23 ENCOUNTER — Ambulatory Visit: Payer: Medicare Other

## 2012-08-08 ENCOUNTER — Inpatient Hospital Stay (HOSPITAL_COMMUNITY)
Admission: EM | Admit: 2012-08-08 | Discharge: 2012-08-11 | DRG: 556 | Disposition: A | Discharge status: Home Health | Payer: Medicare Other | Attending: Internal Medicine | Admitting: Internal Medicine

## 2012-08-08 ENCOUNTER — Emergency Department (HOSPITAL_COMMUNITY): Payer: Medicare Other

## 2012-08-08 ENCOUNTER — Encounter (HOSPITAL_COMMUNITY): Payer: Self-pay | Admitting: Adult Health

## 2012-08-08 ENCOUNTER — Encounter (HOSPITAL_COMMUNITY): Payer: Self-pay | Admitting: *Deleted

## 2012-08-08 ENCOUNTER — Emergency Department (INDEPENDENT_AMBULATORY_CARE_PROVIDER_SITE_OTHER)
Admission: EM | Admit: 2012-08-08 | Discharge: 2012-08-08 | Disposition: A | Discharge status: Other Acute Inpatient Hospital | Payer: Medicare Other | Source: Home / Self Care | Attending: Emergency Medicine | Admitting: Emergency Medicine

## 2012-08-08 DIAGNOSIS — E119 Type 2 diabetes mellitus without complications: Secondary | ICD-10-CM

## 2012-08-08 DIAGNOSIS — K219 Gastro-esophageal reflux disease without esophagitis: Secondary | ICD-10-CM | POA: Diagnosis present

## 2012-08-08 DIAGNOSIS — N736 Female pelvic peritoneal adhesions (postinfective): Secondary | ICD-10-CM

## 2012-08-08 DIAGNOSIS — I10 Essential (primary) hypertension: Secondary | ICD-10-CM | POA: Diagnosis present

## 2012-08-08 DIAGNOSIS — D1779 Benign lipomatous neoplasm of other sites: Secondary | ICD-10-CM | POA: Diagnosis present

## 2012-08-08 DIAGNOSIS — M1990 Unspecified osteoarthritis, unspecified site: Secondary | ICD-10-CM

## 2012-08-08 DIAGNOSIS — R29898 Other symptoms and signs involving the musculoskeletal system: Principal | ICD-10-CM

## 2012-08-08 DIAGNOSIS — R200 Anesthesia of skin: Secondary | ICD-10-CM

## 2012-08-08 DIAGNOSIS — M1610 Unilateral primary osteoarthritis, unspecified hip: Secondary | ICD-10-CM | POA: Diagnosis present

## 2012-08-08 DIAGNOSIS — Z90710 Acquired absence of both cervix and uterus: Secondary | ICD-10-CM

## 2012-08-08 DIAGNOSIS — R209 Unspecified disturbances of skin sensation: Secondary | ICD-10-CM

## 2012-08-08 DIAGNOSIS — F329 Major depressive disorder, single episode, unspecified: Secondary | ICD-10-CM | POA: Diagnosis present

## 2012-08-08 DIAGNOSIS — F3289 Other specified depressive episodes: Secondary | ICD-10-CM | POA: Diagnosis present

## 2012-08-08 DIAGNOSIS — K5900 Constipation, unspecified: Secondary | ICD-10-CM | POA: Diagnosis present

## 2012-08-08 DIAGNOSIS — F0390 Unspecified dementia without behavioral disturbance: Secondary | ICD-10-CM | POA: Diagnosis present

## 2012-08-08 DIAGNOSIS — Z79899 Other long term (current) drug therapy: Secondary | ICD-10-CM

## 2012-08-08 DIAGNOSIS — Z7982 Long term (current) use of aspirin: Secondary | ICD-10-CM

## 2012-08-08 DIAGNOSIS — M129 Arthropathy, unspecified: Secondary | ICD-10-CM | POA: Diagnosis present

## 2012-08-08 DIAGNOSIS — E663 Overweight: Secondary | ICD-10-CM

## 2012-08-08 DIAGNOSIS — E785 Hyperlipidemia, unspecified: Secondary | ICD-10-CM | POA: Diagnosis present

## 2012-08-08 DIAGNOSIS — M799 Soft tissue disorder, unspecified: Secondary | ICD-10-CM

## 2012-08-08 LAB — CBC
HCT: 34 % — ABNORMAL LOW (ref 36.0–46.0)
Hemoglobin: 11.6 g/dL — ABNORMAL LOW (ref 12.0–15.0)
MCV: 96.9 fL (ref 78.0–100.0)
RBC: 3.51 MIL/uL — ABNORMAL LOW (ref 3.87–5.11)
RDW: 13 % (ref 11.5–15.5)
WBC: 7.7 10*3/uL (ref 4.0–10.5)

## 2012-08-08 LAB — COMPREHENSIVE METABOLIC PANEL
AST: 17 U/L (ref 0–37)
BUN: 23 mg/dL (ref 6–23)
CO2: 26 mEq/L (ref 19–32)
Calcium: 9.6 mg/dL (ref 8.4–10.5)
Chloride: 108 mEq/L (ref 96–112)
Creatinine, Ser: 0.73 mg/dL (ref 0.50–1.10)
GFR calc Af Amer: >90 mL/min (ref >90)
GFR calc non Af Amer: 84 mL/min — ABNORMAL LOW (ref >90)
Glucose, Bld: 99 mg/dL (ref 70–99)
Total Bilirubin: 0.3 mg/dL (ref 0.3–1.2)

## 2012-08-08 LAB — DIFFERENTIAL
Basophils Absolute: 0 10*3/uL (ref 0.0–0.1)
Eosinophils Relative: 3 % (ref 0–5)
Lymphocytes Relative: 37 % (ref 12–46)
Lymphs Abs: 2.8 10*3/uL (ref 0.7–4.0)
Monocytes Absolute: 0.5 10*3/uL (ref 0.1–1.0)
Monocytes Relative: 6 % (ref 3–12)
Neutro Abs: 4.1 10*3/uL (ref 1.7–7.7)

## 2012-08-08 LAB — GLUCOSE, CAPILLARY
Glucose-Capillary: 90 mg/dL (ref 70–99)
Glucose-Capillary: 107 mg/dL — ABNORMAL HIGH (ref 70–99)

## 2012-08-08 LAB — APTT: aPTT: 25 seconds (ref 24–37)

## 2012-08-08 MED ORDER — ONDANSETRON HCL 4 MG/2ML IJ SOLN: 4.0000 mg | Freq: Three times a day (TID) | INTRAMUSCULAR | Status: AC | PRN

## 2012-08-08 MED ORDER — PNEUMOCOCCAL VAC POLYVALENT 25 MCG/0.5ML IJ INJ
0.5000 mL | INJECTION | INTRAMUSCULAR | Status: AC
  Administered 2012-08-09: 0.5 mL via INTRAMUSCULAR
  Filled 2012-08-08: qty 0.5

## 2012-08-08 NOTE — ED Notes (Signed)
Patient transported to CT 

## 2012-08-08 NOTE — ED Notes (Signed)
Pt  C/o  Sensation of  r  Leg   Cool  To  Touch        X  4  Days  She  denys  Any  Pain      denys  Any  Injury        States  Symptoms  Started   4  Days  Ago  After    Starting  celebrex

## 2012-08-08 NOTE — ED Provider Notes (Signed)
History     CSN: [id number]  Arrival date & time 08/08/12  1525   First MD Initiated Contact with Patient 08/08/12 1703      Chief Complaint  Patient presents with  . Weakness    (Consider location/radiation/quality/duration/timing/severity/associated sxs/prior treatment) HPI Comments: Patient is a 72 year old female with a past medical history of diabetes and hyperlipidemia who presents with a 4 day history of right leg weakness. Patient reports feeling her leg feeling cold and also being unable to bear weight on the right leg because it "gives out." She reports decreased strength in the right leg as well. Symptoms have been constant since the onset. Patient thinks the weakness may be related to starting to take Celebrex. Patient denies any associated symptoms such as headache, fever. No aggravating/alleviating factors.   Patient is a 72 y.o. female presenting with weakness.  Weakness Associated symptoms include weakness.    Past Medical History  Diagnosis Date  . Constipation   . Pelvic adhesions   . Diabetes mellitus   . Hypertension   . Arthritis     hips/neck    Past Surgical History  Procedure Laterality Date  . Partial hysterectomy      Family History  Problem Relation Age of Onset  . Cancer Father     bone  . Heart disease Mother     History  Substance Use Topics  . Smoking status: Never Smoker   . Smokeless tobacco: Not on file  . Alcohol Use: No    OB History   Grav Para Term Preterm Abortions TAB SAB Ect Mult Living   1 1        1       Review of Systems  Neurological: Positive for weakness.  All other systems reviewed and are negative.    Allergies  Review of patient's allergies indicates no known allergies.  Home Medications   Current Outpatient Rx  Name  Route  Sig  Dispense  Refill  . aspirin 81 MG tablet   Oral   Take 81 mg by mouth daily.         Marland Kitchen atorvastatin (LIPITOR) 20 MG tablet   Oral   Take 20 mg by mouth at bedtime.           . citalopram (CELEXA) 20 MG tablet   Oral   Take 20 mg by mouth daily.         . metFORMIN (GLUCOPHAGE) 500 MG tablet   Oral   Take 500 mg by mouth daily with breakfast.         . naproxen sodium (ANAPROX) 220 MG tablet   Oral   Take 220 mg by mouth 2 (two) times daily as needed. pain         . omeprazole (PRILOSEC) 20 MG capsule   Oral   Take 20 mg by mouth daily.          Marland Kitchen oxyCODONE (OXY IR/ROXICODONE) 5 MG immediate release tablet   Oral   Take 1 tablet (5 mg total) by mouth every 4 (four) hours as needed. For pain   15 tablet   0   . senna (SENOKOT) 8.6 MG TABS   Oral   Take 2 tablets by mouth daily as needed. As needed for constipation.         . valsartan-hydrochlorothiazide (DIOVAN-HCT) 160-12.5 MG per tablet   Oral   Take 1 tablet by mouth every morning.          Marland Kitchen  Vitamin D, Ergocalciferol, (DRISDOL) 50000 UNITS CAPS   Oral   Take 50,000 Units by mouth every 7 (seven) days. Monday         . zolpidem (AMBIEN) 10 MG tablet   Oral   Take 10 mg by mouth at bedtime as needed. For sleep           BP 115/61  Pulse 64  Temp(Src) 97.4 F (36.3 C) (Oral)  Resp 12  SpO2 97%  Physical Exam  Nursing note and vitals reviewed. Constitutional: She is oriented to person, place, and time. She appears well-developed and well-nourished. No distress.  HENT:  Head: Normocephalic and atraumatic.  Mouth/Throat: Oropharynx is clear and moist. No oropharyngeal exudate.  Eyes: Conjunctivae and EOM are normal. Pupils are equal, round, and reactive to light. No scleral icterus.  Neck: Normal range of motion. Neck supple.  Cardiovascular: Normal rate, regular rhythm and intact distal pulses.  Exam reveals no gallop and no friction rub.   No murmur heard. Pulmonary/Chest: Effort normal and breath sounds normal. She has no wheezes. She has no rales. She exhibits no tenderness.  Abdominal: Soft. She exhibits no distension. There is no tenderness. There  is no rebound and no guarding.  Musculoskeletal: Normal range of motion.  Neurological: She is alert and oriented to person, place, and time. No cranial nerve deficit. Coordination normal.  Right leg decreased sensation to light touch and sharp. Extremity strength equal and intact bilaterally. Speech is goal-oriented. Moves limbs without ataxia.   Skin: Skin is warm and dry.  Psychiatric: She has a normal mood and affect. Her behavior is normal.    ED Course  Procedures (including critical care time)  Labs Reviewed  CBC - Abnormal; Notable for the following:    RBC 3.51 (*)    Hemoglobin 11.6 (*)    HCT 34.0 (*)    Platelets 125 (*)    All other components within normal limits  COMPREHENSIVE METABOLIC PANEL - Abnormal; Notable for the following:    GFR calc non Af Amer 84 (*)    All other components within normal limits  GLUCOSE, CAPILLARY - Abnormal; Notable for the following:    Glucose-Capillary 107 (*)    All other components within normal limits  PROTIME-INR  APTT  DIFFERENTIAL  TROPONIN I   Ct Head (brain) Wo Contrast  08/08/2012   *RADIOLOGY REPORT*  Clinical Data: Weakness  CT HEAD WITHOUT CONTRAST  Technique:  Contiguous axial images were obtained from the base of the skull through the vertex without contrast.  Comparison: Head CT 11/06/2011  Findings: No acute intracranial hemorrhage.  No focal mass lesion. No CT evidence of acute infarction.   No midline shift or mass effect.  No hydrocephalus.  Basilar cisterns are patent.  There is a mild periventricular white matter hypodensities.  Paranasal sinuses and mastoid air cells are clear.  Orbits are normal.  There is several rounded ossific densities within the scalp not changed from prior.  IMPRESSION: No acute intracranial findings.  White matter hypodensities most consistent small vessel ischemia.   Original Report Authenticated By: Genevive Bi, M.D.     1. Right leg numbness       MDM  5:26 PM Patient will  have CT head to rule out stroke. Labs pending.   7:42 PM Labs unremarkable. Troponin negative. CT head unremarkable for acute changes. Patient will be admitted for right leg weakness concerning for stroke.       Emilia Beck, PA-C 08/08/12 2355

## 2012-08-08 NOTE — ED Provider Notes (Signed)
History     CSN: [id number]  Arrival date & time 08/08/12  1230   First MD Initiated Contact with Patient 08/08/12 1359      Chief Complaint  Patient presents with  . Cold Extremity    (Consider location/radiation/quality/duration/timing/severity/associated sxs/prior treatment) HPI Comments: Patient presents urgent care brought in by her husband which reports a for 4 days she has been unable to walk on her right lower leg as her leg gives out. Patient describes that she has been feeling "a cold leg" and no strength. She denies any injuries or falls. Denies any further symptoms such as headache, weakness of an upper lower extremity, does describe some numbness sensation or tingling in her right foot ( intermittently). Husband tends to interrupt frequently when patient is describing symptomatology. He ( patient's husband), describes that he took her to see her doctor last week and he prescribed her Celebrex after she has taken the third dose she can no longer walk.  Patient is also describing left-sided cervical pain that exacerbates with movement and activity denies any recent falls or injuries. No fevers.  Patient is a 72 y.o. female presenting with extremity weakness and leg pain. The history is provided by the spouse.  Extremity Weakness This is a new problem. The current episode started more than 2 days ago. The problem occurs constantly. The problem has been gradually worsening. Pertinent negatives include no chest pain, no abdominal pain, no headaches and no shortness of breath. The symptoms are aggravated by bending. Nothing relieves the symptoms. She has tried nothing for the symptoms.  Leg Pain Location:  Leg Time since incident:  4 days Leg location:  R leg Pain details:    Quality:  Unable to specify   Radiates to:  Does not radiate   Severity:  Moderate Chronicity:  New Dislocation: no   Prior injury to area:  No Relieved by:  Nothing Worsened by:  Nothing  tried Associated symptoms: no back pain, no fatigue and no fever   Risk factors: no concern for non-accidental trauma     Past Medical History  Diagnosis Date  . Constipation   . Pelvic adhesions   . Diabetes mellitus   . Hypertension   . Arthritis     hips/neck    Past Surgical History  Procedure Laterality Date  . Partial hysterectomy      Family History  Problem Relation Age of Onset  . Cancer Father     bone  . Heart disease Mother     History  Substance Use Topics  . Smoking status: Never Smoker   . Smokeless tobacco: Not on file  . Alcohol Use: No    OB History   Grav Para Term Preterm Abortions TAB SAB Ect Mult Living   1 1        1       Review of Systems  Constitutional: Positive for activity change. Negative for fever, chills, diaphoresis, appetite change, fatigue and unexpected weight change.  Respiratory: Negative for shortness of breath.   Cardiovascular: Negative for chest pain, palpitations and leg swelling.  Gastrointestinal: Negative for abdominal pain.  Musculoskeletal: Positive for gait problem and extremity weakness. Negative for myalgias, back pain, joint swelling and arthralgias.  Skin: Negative for color change, pallor, rash and wound.  Neurological: Positive for weakness and numbness. Negative for dizziness and headaches.  Hematological: Negative for adenopathy.    Allergies  Review of patient's allergies indicates no known allergies.  Home Medications   Current  Outpatient Rx  Name  Route  Sig  Dispense  Refill  . aspirin 81 MG tablet   Oral   Take 81 mg by mouth daily.         Marland Kitchen atorvastatin (LIPITOR) 20 MG tablet   Oral   Take 20 mg by mouth at bedtime.          . citalopram (CELEXA) 20 MG tablet   Oral   Take 20 mg by mouth daily.         . metFORMIN (GLUCOPHAGE) 500 MG tablet   Oral   Take 500 mg by mouth daily with breakfast.         . naproxen sodium (ANAPROX) 220 MG tablet   Oral   Take 220 mg by mouth 2  (two) times daily as needed. pain         . omeprazole (PRILOSEC) 20 MG capsule   Oral   Take 20 mg by mouth daily.          Marland Kitchen oxyCODONE (OXY IR/ROXICODONE) 5 MG immediate release tablet   Oral   Take 1 tablet (5 mg total) by mouth every 4 (four) hours as needed. For pain   15 tablet   0   . oxyCODONE-acetaminophen (PERCOCET/ROXICET) 5-325 MG per tablet   Oral   Take 1 tablet by mouth every 4 (four) hours as needed for pain.   15 tablet   0   . senna (SENOKOT) 8.6 MG TABS   Oral   Take 2 tablets by mouth daily as needed. As needed for constipation.         . valsartan-hydrochlorothiazide (DIOVAN-HCT) 160-12.5 MG per tablet   Oral   Take 1 tablet by mouth every morning.          . Vitamin D, Ergocalciferol, (DRISDOL) 50000 UNITS CAPS   Oral   Take 50,000 Units by mouth every 7 (seven) days. Monday         . zolpidem (AMBIEN) 10 MG tablet   Oral   Take 10 mg by mouth at bedtime as needed. For sleep           There were no vitals taken for this visit.  Physical Exam  Constitutional: Vital signs are normal. She appears well-developed.  Non-toxic appearance. She does not have a sickly appearance. She does not appear ill. No distress.  Eyes: Conjunctivae are normal.  Musculoskeletal: She exhibits no edema and no tenderness.  Neurological: She is alert. She is not disoriented. She displays no tremor and normal reflexes. No sensory deficit. She exhibits normal muscle tone. She displays no seizure activity.  Muscular strenght RLE 3/5 vs LLE 5/5 NO vascular- deficits- palpable- dorsalis pedis and popliteal pulse-  Patient refusses to ambulate " leg gives out"...   no soft tissue swelling, no erythema, conserved distal proprioception. No peripheral cyanosis, or discrepancy in skin temperature no position to opposite leg  Skin: No rash noted. No erythema. No pallor.    ED Course  Procedures (including critical care time)  Labs Reviewed  GLUCOSE, CAPILLARY    No results found.   1. Weakness of right leg       MDM  Patient is non-ambulatory  at [facility]. Reporting sudden weakness of right lower extremities 4 days. Exam was not suggestive of a acute arterial or venous vascular deficit. Quadriceps strength seemed to be different 3/5 versus 5/5. No further neurological symptoms. Patient is reporting that she is not ambulatory and she's unable to walk  on her leg. Denies any recent trauma or injury. Patient is also reporting cervical pain. Afebrile, at this point unknown etiology ( poor historian)for sudden onset of weakness of right lower extremity. Patient will be transferred to the emergency department for further evaluation. Today's exam, and HPI, unable to rule out a subacute neurological event      and he  Jimmie Molly, MD 08/08/12 [phone]
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
## 2012-08-08 NOTE — ED Provider Notes (Signed)
72 year old female with a history of diabetes and hypertension who presents with a complaint of right lower extremity numbness and weakness. She states that over the last 4 days she has felt like her right leg has been cold, has decreased sensation in her foot and has been dragging her leg when she walks. She has no complaints of her upper extremities, no complaints of her vision or speech or swallowing. On exam she has soft abdomen, clear heart and lung sounds without murmurs, no signs of A. Fib, no JVD, no peripheral edema, soft compartments and no significant asymmetry. She does have decreased sensation to the right lower extremity below the knee to light touch and pinprick. She is able to straight leg raise and has 5 out of 5 strength in both lower extremities. No obvious ataxia  The patient will need a stroke workup, she likely needs to be admitted for MRI and further testing.   Medical screening examination/treatment/procedure(s) were conducted as a shared visit with non-physician practitioner(s) and myself.  I personally evaluated the patient during the encounter    Vida Roller, MD 08/08/12 1725

## 2012-08-09 LAB — HEMOGLOBIN A1C: Mean Plasma Glucose: 97 mg/dL (ref <117)

## 2012-08-09 LAB — GLUCOSE, CAPILLARY
Glucose-Capillary: 107 mg/dL — ABNORMAL HIGH (ref 70–99)
Glucose-Capillary: 90 mg/dL (ref 70–99)
Glucose-Capillary: 95 mg/dL (ref 70–99)

## 2012-08-09 LAB — RPR: RPR Ser Ql: NONREACTIVE

## 2012-08-09 LAB — VITAMIN B12: Vitamin B-12: 451 pg/mL (ref 211–911)

## 2012-08-09 MED ORDER — SENNA 8.6 MG PO TABS
2.0000 | ORAL_TABLET | Freq: Every day | ORAL | Status: DC | PRN
  Administered 2012-08-09: 17.2 mg via ORAL
  Filled 2012-08-09: qty 1

## 2012-08-09 MED ORDER — ZOLPIDEM TARTRATE 5 MG PO TABS: 10.0000 mg | ORAL_TABLET | Freq: Every evening | ORAL | Status: DC | PRN

## 2012-08-09 MED ORDER — PANTOPRAZOLE SODIUM 40 MG PO TBEC
40.0000 mg | DELAYED_RELEASE_TABLET | Freq: Every day | ORAL | Status: DC
  Administered 2012-08-09 – 2012-08-10 (×2): 40 mg via ORAL
  Filled 2012-08-09: qty 1

## 2012-08-09 MED ORDER — OXYCODONE HCL 5 MG PO TABS
5.0000 mg | ORAL_TABLET | ORAL | Status: DC | PRN
  Administered 2012-08-09 – 2012-08-10 (×4): 5 mg via ORAL
  Filled 2012-08-09 (×4): qty 1

## 2012-08-09 MED ORDER — ALUM & MAG HYDROXIDE-SIMETH 200-200-20 MG/5ML PO SUSP: 30.0000 mL | Freq: Four times a day (QID) | ORAL | Status: DC | PRN

## 2012-08-09 MED ORDER — IRBESARTAN 150 MG PO TABS
150.0000 mg | ORAL_TABLET | Freq: Every day | ORAL | Status: DC
  Administered 2012-08-09 – 2012-08-11 (×3): 150 mg via ORAL
  Filled 2012-08-09 (×3): qty 1

## 2012-08-09 MED ORDER — CITALOPRAM HYDROBROMIDE 20 MG PO TABS
20.0000 mg | ORAL_TABLET | Freq: Every day | ORAL | Status: DC
  Administered 2012-08-09 – 2012-08-11 (×3): 20 mg via ORAL
  Filled 2012-08-09 (×3): qty 1

## 2012-08-09 MED ORDER — NAPROXEN SODIUM 220 MG PO TABS: 220.0000 mg | ORAL_TABLET | Freq: Two times a day (BID) | ORAL | Status: DC | PRN

## 2012-08-09 MED ORDER — VALSARTAN-HYDROCHLOROTHIAZIDE 160-12.5 MG PO TABS: 1.0000 | ORAL_TABLET | Freq: Every morning | ORAL | Status: DC

## 2012-08-09 MED ORDER — NAPROXEN SODIUM 275 MG PO TABS
275.0000 mg | ORAL_TABLET | Freq: Two times a day (BID) | ORAL | Status: DC | PRN
  Filled 2012-08-09: qty 1

## 2012-08-09 MED ORDER — ASPIRIN 81 MG PO CHEW
81.0000 mg | CHEWABLE_TABLET | Freq: Every day | ORAL | Status: DC
  Administered 2012-08-09 – 2012-08-11 (×3): 81 mg via ORAL
  Filled 2012-08-09 (×3): qty 1

## 2012-08-09 MED ORDER — SODIUM CHLORIDE 0.9 % IJ SOLN
3.0000 mL | Freq: Two times a day (BID) | INTRAMUSCULAR | Status: DC
  Administered 2012-08-09 – 2012-08-10 (×4): 3 mL via INTRAVENOUS

## 2012-08-09 MED ORDER — HEPARIN SODIUM (PORCINE) 5000 UNIT/ML IJ SOLN
5000.0000 [IU] | Freq: Three times a day (TID) | INTRAMUSCULAR | Status: DC
  Administered 2012-08-09 – 2012-08-11 (×7): 5000 [IU] via SUBCUTANEOUS
  Filled 2012-08-09 (×10): qty 1

## 2012-08-09 MED ORDER — INSULIN ASPART 100 UNIT/ML ~~LOC~~ SOLN
0.0000 [IU] | Freq: Three times a day (TID) | SUBCUTANEOUS | Status: DC
  Administered 2012-08-10: 5 [IU] via SUBCUTANEOUS
  Administered 2012-08-10: 2 [IU] via SUBCUTANEOUS

## 2012-08-09 MED ORDER — ATORVASTATIN CALCIUM 20 MG PO TABS
20.0000 mg | ORAL_TABLET | Freq: Every day | ORAL | Status: DC
  Administered 2012-08-09 – 2012-08-10 (×2): 20 mg via ORAL
  Filled 2012-08-09 (×4): qty 1

## 2012-08-09 MED ORDER — HYDROCHLOROTHIAZIDE 12.5 MG PO CAPS
12.5000 mg | ORAL_CAPSULE | Freq: Every day | ORAL | Status: DC
  Administered 2012-08-09 – 2012-08-11 (×3): 12.5 mg via ORAL
  Filled 2012-08-09 (×3): qty 1

## 2012-08-09 NOTE — Progress Notes (Signed)
UR complete.  Matilynn Dacey RN, MSN 

## 2012-08-09 NOTE — Evaluation (Signed)
Have collaborated with this patient and the note below. 08/09/2012  Danvers Bing, PT [phone]  (pager)

## 2012-08-09 NOTE — H&P (Signed)
[facility] History and Physical  MATTILYN CRITES ZOX:[id number] DOB: 30-Dec-1940 DOA: 08/08/2012  Referring physician: ED PCP: Dorrene German, MD   Chief Complaint: Weakness  HPI: Valerie Sullivan is a 72 y.o. female pmh DM2 and hyperlipidemia who presents with a 4 day h/o RLE weakness.  Patient reports her leg feeling "cold" and also unable to bear weight because it gives out.  At baseline she walks with a walker and has been having trouble with BLE weakness for the past 2 years but worse over the past 4 days.  Of note she started a new medication Celebrex recently.  In the ED she was found to have decreased sensation in the RLE especially below the knee to pinprick and light touch, CT head demonstrated no acute abnormalities, hospitalist has been asked for MRI brain to r/o stroke and also because husband who is a disabled veteran is encountering increasing difficulties in caring for the patient who also is developing dementia as well it seems.  Review of Systems: 12 systems reviewed and otherwise negative.  Past Medical History  Diagnosis Date  . Constipation   . Pelvic adhesions   . Diabetes mellitus   . Hypertension   . Arthritis     hips/neck   Past Surgical History  Procedure Laterality Date  . Partial hysterectomy     Social History:  reports that she has never smoked. She does not have any smokeless tobacco history on file. She reports that she does not drink alcohol or use illicit drugs.   No Known Allergies  Family History  Problem Relation Age of Onset  . Cancer Father     bone  . Heart disease Mother     Prior to Admission medications   Medication Sig Start Date End Date Taking? Authorizing Provider  aspirin 81 MG tablet Take 81 mg by mouth daily.   Yes Historical Provider, MD  atorvastatin (LIPITOR) 20 MG tablet Take 20 mg by mouth at bedtime.    Yes Historical Provider, MD  citalopram (CELEXA) 20 MG tablet Take 20 mg by mouth daily.   Yes Historical  Provider, MD  metFORMIN (GLUCOPHAGE) 500 MG tablet Take 500 mg by mouth daily with breakfast.   Yes Historical Provider, MD  naproxen sodium (ANAPROX) 220 MG tablet Take 220 mg by mouth 2 (two) times daily as needed. pain   Yes Historical Provider, MD  omeprazole (PRILOSEC) 20 MG capsule Take 20 mg by mouth daily.    Yes Historical Provider, MD  oxyCODONE (OXY IR/ROXICODONE) 5 MG immediate release tablet Take 1 tablet (5 mg total) by mouth every 4 (four) hours as needed. For pain 03/25/12  Yes Hurman Horn, MD  senna (SENOKOT) 8.6 MG TABS Take 2 tablets by mouth daily as needed. As needed for constipation.   Yes Historical Provider, MD  valsartan-hydrochlorothiazide (DIOVAN-HCT) 160-12.5 MG per tablet Take 1 tablet by mouth every morning.    Yes Historical Provider, MD  Vitamin D, Ergocalciferol, (DRISDOL) 50000 UNITS CAPS Take 50,000 Units by mouth every 7 (seven) days. Monday   Yes Historical Provider, MD  zolpidem (AMBIEN) 10 MG tablet Take 10 mg by mouth at bedtime as needed. For sleep   Yes Historical Provider, MD   Physical Exam: Filed Vitals:   08/08/12 1945 08/08/12 2030 08/08/12 2203 08/08/12 2309  BP: 168/79 125/65 181/84 160/77  Pulse: 58 73 65 62  Temp:   97.5 F (36.4 C)   TempSrc:   Oral   Resp: 15 18  18   Height:   5\' 3"  (1.6 m)   Weight:   72 kg (157 lb 10.1 oz)   SpO2: 98% 97% 98%     General:  NAD, resting comfortably in bed Eyes: PEERLA EOMI ENT: mucous membranes moist Neck: supple w/o JVD Cardiovascular: RRR w/o MRG Respiratory: CTA B Abdomen: soft, nt, nd, bs+ Skin: no rash nor lesion Musculoskeletal: has a soft tissue lesion on her back, suspicious for lipoma though suspect this will end up being a lipoma Psychiatric: normal tone and affect Neurologic: RLE demonstrates decreased sensation to light touch, strength is intact and equal bilaterally, no ataxia on exam  Labs on Admission:  Basic Metabolic Panel:  Recent Labs Lab 08/08/12 1653  NA 145  K  3.8  CL 108  CO2 26  GLUCOSE 99  BUN 23  CREATININE 0.73  CALCIUM 9.6   Liver Function Tests:  Recent Labs Lab 08/08/12 1653  AST 17  ALT 10  ALKPHOS 62  BILITOT 0.3  PROT 6.8  ALBUMIN 3.6   No results found for this basename: LIPASE, AMYLASE,  in the last 168 hours No results found for this basename: AMMONIA,  in the last 168 hours CBC:  Recent Labs Lab 08/08/12 1653  WBC 7.7  NEUTROABS 4.1  HGB 11.6*  HCT 34.0*  MCV 96.9  PLT 125*   Cardiac Enzymes:  Recent Labs Lab 08/08/12 1653  TROPONINI <0.30    BNP (last 3 results) No results found for this basename: PROBNP,  in the last 8760 hours CBG:  Recent Labs Lab 08/08/12 1453 08/08/12 1551  GLUCAP 90 107*    Radiological Exams on Admission: Ct Head (brain) Wo Contrast  08/08/2012   *RADIOLOGY REPORT*  Clinical Data: Weakness  CT HEAD WITHOUT CONTRAST  Technique:  Contiguous axial images were obtained from the base of the skull through the vertex without contrast.  Comparison: Head CT 11/06/2011  Findings: No acute intracranial hemorrhage.  No focal mass lesion. No CT evidence of acute infarction.   No midline shift or mass effect.  No hydrocephalus.  Basilar cisterns are patent.  There is a mild periventricular white matter hypodensities.  Paranasal sinuses and mastoid air cells are clear.  Orbits are normal.  There is several rounded ossific densities within the scalp not changed from prior.  IMPRESSION: No acute intracranial findings.  White matter hypodensities most consistent small vessel ischemia.   Original Report Authenticated By: Genevive Bi, M.D.    EKG: Independently reviewed.  Assessment/Plan Principal Problem:   Right leg weakness Active Problems:   Diabetes mellitus   Hyperlipidemia   Soft tissue lesion   1. RLE weakness - seems to be acute on chronic with focal neurlogic findings of decreased sensation below the knee, pulses intact, suspicious for a primary neurologic process,  admitting patient for MRI brain to r/o stroke, MRI L spine to evaluate for stenosis (although should be noted she had a fairly unremarkable study in 10/2011).  If all of this is negative then may require nerve conduction studies.   2. Soft tissue lesion of back - suspect this will end up being a lipoma though likely will be evaluated during MRI L spine as well, if not adequately evaluated then may wish to order ultrasound. 3. DM2 - holding metformin and putting patient on med dose SSI 4. Hyperlipidemia - continue home meds    Code Status: Full Code (must indicate code status--if unknown or must be presumed, indicate so) Family Communication: Spoke with  husband at bedside (indicate person spoken with, if applicable, with phone number if by telephone) Disposition Plan: Admit to inpatient (indicate anticipated LOS)  Time spent: 70 min  GARDNER, JARED M. [facility] Pager [phone]  If 7PM-7AM, please contact night-coverage [email or web address] Password [email or web address] 08/09/2012, 12:01 AM

## 2012-08-09 NOTE — Progress Notes (Signed)
PT Cancellation Note  Patient Details Name: Valerie Sullivan MRN: [id number] DOB: 1940/11/17   Cancelled Treatment:    Reason Eval/Treat Not Completed: Other (comment) (husband refused to let therapy assist pt at this time.)  Will return and try at a later time or tomorrow. 08/09/2012  Dunn Bing, PT [phone]  (pager)   Nimai Burbach, Eliseo Gum 08/09/2012, 1:25 PM

## 2012-08-09 NOTE — ED Provider Notes (Signed)
Medical screening examination/treatment/procedure(s) were conducted as a shared visit with non-physician practitioner(s) and myself.  I personally evaluated the patient during the encounter  Please see my separate respective documentation pertaining to this patient encounter   Vida Roller, MD 08/09/12 [phone]

## 2012-08-09 NOTE — Evaluation (Signed)
Physical Therapy Evaluation Patient Details Name: Valerie Sullivan MRN: [id number] DOB: [birth year] Today's Date: 08/09/2012 Time: 7829-5621 PT Time Calculation (min): 24 min  PT Assessment / Plan / Recommendation Clinical Impression  72 y/o female pt admitted for 4 day history ofright LE weakness. Pt willing to participate in therapies. Pt able to perform basic transfers with mod assit. Ambulation limited to a few steps away from the bed due to LE weakness and fatigue. Pt would benefit from acute PT to maximize functional mobility and saftey for d/c to next venue. D/c recommendation TBD as more information available about home assistance.    PT Assessment  Patient needs continued PT services    Follow Up Recommendations  Supervision/Assistance - 24 hour;Other (comment) (TBD with more information about home assistance available)       Barriers to Discharge   Physical limitations of husband as caregiver       Recommendations for Other Services OT consult   Frequency Min 3X/week    Precautions / Restrictions Precautions Precautions: Fall Restrictions Weight Bearing Restrictions: No   Pertinent Vitals/Pain 10/10 right LE pain verbalized with standing EOB. Pt repositioned and notified nursing.      Mobility  Bed Mobility Bed Mobility: Rolling Right;Sit to Sidelying Left Rolling Right: 4: Min guard Sit to Sidelying Left: 4: Min assist (min assist to raise feet to bed) Details for Bed Mobility Assistance: v/c and t/c for sequencing and technique Transfers Transfers: Sit to Stand;Stand to Sit Sit to Stand: 3: Mod assist;From bed Stand to Sit: 3: Mod assist;To bed Details for Transfer Assistance: Pt able to initiate movement, but needing mod assist to extend hips and erect trunk due to weakness. Ambulation/Gait Ambulation/Gait Assistance: 3: Mod assist Ambulation Distance (Feet): 2 Feet Assistive device: Rolling walker Ambulation/Gait Assistance Details: V/c and facilitation of  upright posture. Facilitation to maintain hip extension Gait Pattern: Step-to pattern;Decreased stride length;Decreased stance time - right;Decreased weight shift to right;Antalgic Gait velocity: decreased Stairs: No Wheelchair Mobility Wheelchair Mobility: No    Exercises     PT Diagnosis: Difficulty walking;Abnormality of gait;Generalized weakness;Acute pain  PT Problem List: Decreased strength;Decreased activity tolerance;Decreased balance;Decreased mobility;Impaired sensation;Decreased knowledge of precautions;Decreased safety awareness PT Treatment Interventions: DME instruction;Gait training;Stair training;Functional mobility training;Therapeutic activities;Therapeutic exercise;Balance training;Neuromuscular re-education;Patient/family education   PT Goals Acute Rehab PT Goals PT Goal Formulation: With patient Time For Goal Achievement: 08/16/12 Potential to Achieve Goals: Fair Pt will go Supine/Side to Sit: with supervision PT Goal: Supine/Side to Sit - Progress: Goal set today Pt will go Sit to Stand: with min assist PT Goal: Sit to Stand - Progress: Goal set today Pt will go Stand to Sit: with supervision PT Goal: Stand to Sit - Progress: Goal set today Pt will Ambulate: with supervision;51 - 150 feet;with least restrictive assistive device PT Goal: Ambulate - Progress: Goal set today  Visit Information  Last PT Received On: 08/09/12 Assistance Needed: +1     Subjective Data  Subjective: "My right leg hurts like a charlie horse" Patient Stated Goal: return home   Prior Functioning  Home Living Lives With: Spouse Available Help at Discharge: Family;Available 24 hours/day Type of Home: House Home Access: Stairs to enter [employer] of Steps: 3 Entrance Stairs-Rails: None Home Layout: One level Bathroom Shower/Tub: Walk-in Contractor: Standard Home Adaptive Equipment: Environmental consultant - four wheeled Prior Function Level of Independence:  Needs assistance Needs Assistance: Bathing;Dressing;Grooming;Toileting;Meal Prep;Light Housekeeping;Gait;Transfers Driving: No Communication Communication: No difficulties    Cognition  Cognition Arousal/Alertness: Awake/alert  Behavior During Therapy: WFL for tasks assessed/performed Overall Cognitive Status: History of cognitive impairments - at baseline    Extremity/Trunk Assessment Right Lower Extremity Assessment RLE ROM/Strength/Tone: Deficits RLE ROM/Strength/Tone Deficits: Knee extension and knee flexion 3+/5 limited by pain. Seated dorsiflexion 4-/5  RLE Sensation: Deficits RLE Sensation Deficits: Light touch "just feels different" Left Lower Extremity Assessment LLE ROM/Strength/Tone: Deficits LLE ROM/Strength/Tone Deficits: Seated knee extension and knee flexion 4-/5. Seated dorsiflexion 4-/5  LLE Sensation: WFL - Light Touch Trunk Assessment Trunk Assessment: Kyphotic   Balance Balance Balance Assessed: Yes Static Sitting Balance Static Sitting - Balance Support: Right upper extremity supported;Left upper extremity supported Static Sitting - Level of Assistance: 5: Stand by assistance Static Sitting - Comment/# of Minutes: Sitting EOB while donning extra gown and participating in MMT. Static Standing Balance Static Standing - Balance Support: Bilateral upper extremity supported Static Standing - Level of Assistance: 4: Min assist Static Standing - Comment/# of Minutes: Standing EOB 2 minutes. Pt requiring min assist to maintain hip extension.  End of Session PT - End of Session Equipment Utilized During Treatment: Gait belt Activity Tolerance: Patient limited by fatigue;Patient limited by pain Patient left: in bed;with call bell/phone within reach;with bed alarm set Nurse Communication: Mobility status;Patient requests pain meds  GP     Payton Doughty 08/09/2012, 4:32 PM Payton Doughty, PT Student

## 2012-08-09 NOTE — Progress Notes (Signed)
[facility]                                                                                Patient Demographics  Valerie Sullivan, is a 72 y.o. female, DOB - [birth year], AVW:[id number], [id number]:[id number]  Admit date - 08/08/2012  Admitting Physician Hillary Bow, DO  Outpatient Primary MD for the patient is Dorrene German, MD  LOS - 1   Chief Complaint  Patient presents with  . Weakness        Assessment & Plan   Patient was admitted few hours ago in brief, patient has had chronic weakness of lower extended the right more than left, she's had issues with walking long distances for a long time, she now comes here as husband things it's about time for her to get checked out. There is no acute change in her weakness, however they claim that weakness is gradually progressive over the last 2 years.   1. Right leg more than left weakness - unclear etiology, I agree with MRI brain and L-spine, will also check TSH, B12, RPR, PT evaluation and monitor. Again noted these changes are all chronic with minimal to no acute component. Continue  Aspirin.   2.DM-2 we'll check A1c, continue sliding scale insulin  No results found for this basename: HGBA1C    CBG (last 3)   Recent Labs  08/09/12 0029 08/09/12 0644 08/09/12 1110  GLUCAP 95 88 90     3. Right flank soft tissue lump which has been present for several years, likely a lipoma, monitor L-spine MRI which peaked up. Or as outpatient followup with general surgery, this lump is nontender, no signs of infection.    4. Hypertension, dyslipidemia, depression & GERD. No acute issues continue home medications.    Code Status: Full  Family Communication: husband  Disposition Plan: Home   Procedures pending MRI brain and L-spine   Consults   PT   DVT Prophylaxis   Heparin   Lab Results  Component Value Date   PLT 125* 08/08/2012    Medications  Scheduled Meds: . aspirin  81 mg Oral Daily  . atorvastatin  20  mg Oral QHS  . citalopram  20 mg Oral Daily  . heparin  5,000 Units Subcutaneous Q8H  . hydrochlorothiazide  12.5 mg Oral Daily  . insulin aspart  0-15 Units Subcutaneous TID WC  . irbesartan  150 mg Oral Daily  . pantoprazole  40 mg Oral Daily  . sodium chloride  3 mL Intravenous Q12H   Continuous Infusions:  PRN Meds:.naproxen sodium, oxyCODONE, senna, zolpidem  Antibiotics    Anti-infectives   None       Time Spent in minutes   35   Susa Raring K M.D on 08/09/2012 at 11:08 AM  Between 7am to 7pm - Pager - [phone]  After 7pm go to [email or web address] - password [email or web address]  And look for the night coverage person covering for me after hours  [facility] Office  [phone]    Subjective:   Joycelin Radloff today has, No headache, No chest pain, No abdominal pain - No Nausea, No new weakness tingling or numbness, No Cough - SOB.  Objective:   Filed Vitals:   08/09/12 0242 08/09/12 0432 08/09/12 0608 08/09/12 0927  BP: 163/71 142/73 142/64 153/83  Pulse: 61 63 62 67  Temp: 97.4 F (36.3 C) 97.8 F (36.6 C) 97.9 F (36.6 C) 97.9 F (36.6 C)  TempSrc: Oral Oral Oral Oral  Resp: 20 18 18 18   Height:      Weight:      SpO2: 100% 97% 96% 98%    Wt Readings from Last 3 Encounters:  08/08/12 71.5 kg (157 lb 10.1 oz)  11/13/11 68.493 kg (151 lb)  07/15/11 70.308 kg (155 lb)     Intake/Output Summary (Last 24 hours) at 08/09/12 1108 Last data filed at 08/09/12 0928  Gross per 24 hour  Intake    240 ml  Output      0 ml  Net    240 ml    Exam Awake Alert, Oriented X 3, No new F.N deficits, Normal affect, chronic R>L leg weakness [place].AT,PERRAL Supple Neck,No JVD, No cervical lymphadenopathy appriciated.  Symmetrical Chest wall movement, Good air movement bilaterally, CTAB RRR,No Gallops,Rubs or new Murmurs, No Parasternal Heave +ve B.Sounds, Abd Soft, Non tender, No organomegaly appriciated, No rebound - guarding or rigidity. R Flank Lipoma,  No  Cyanosis, Clubbing or edema, No new Rash or bruise     Data Review   Micro Results No results found for this or any previous visit (from the past 240 hour(s)).  Radiology Reports Ct Head (brain) Wo Contrast  08/08/2012   *RADIOLOGY REPORT*  Clinical Data: Weakness  CT HEAD WITHOUT CONTRAST  Technique:  Contiguous axial images were obtained from the base of the skull through the vertex without contrast.  Comparison: Head CT 11/06/2011  Findings: No acute intracranial hemorrhage.  No focal mass lesion. No CT evidence of acute infarction.   No midline shift or mass effect.  No hydrocephalus.  Basilar cisterns are patent.  There is a mild periventricular white matter hypodensities.  Paranasal sinuses and mastoid air cells are clear.  Orbits are normal.  There is several rounded ossific densities within the scalp not changed from prior.  IMPRESSION: No acute intracranial findings.  White matter hypodensities most consistent small vessel ischemia.   Original Report Authenticated By: Genevive Bi, M.D.    CBC  Recent Labs Lab 08/08/12 1653  WBC 7.7  HGB 11.6*  HCT 34.0*  PLT 125*  MCV 96.9  MCH 33.0  MCHC 34.1  RDW 13.0  LYMPHSABS 2.8  MONOABS 0.5  EOSABS 0.2  BASOSABS 0.0    Chemistries   Recent Labs Lab 08/08/12 1653  NA 145  K 3.8  CL 108  CO2 26  GLUCOSE 99  BUN 23  CREATININE 0.73  CALCIUM 9.6  AST 17  ALT 10  ALKPHOS 62  BILITOT 0.3   ------------------------------------------------------------------------------------------------------------------ estimated creatinine clearance is 61.1 ml/min (by C-G formula based on Cr of 0.73). ------------------------------------------------------------------------------------------------------------------ No results found for this basename: HGBA1C,  in the last 72 hours ------------------------------------------------------------------------------------------------------------------ No results found for this basename:  CHOL, HDL, LDLCALC, TRIG, CHOLHDL, LDLDIRECT,  in the last 72 hours ------------------------------------------------------------------------------------------------------------------ No results found for this basename: TSH, T4TOTAL, FREET3, T3FREE, THYROIDAB,  in the last 72 hours ------------------------------------------------------------------------------------------------------------------ No results found for this basename: VITAMINB12, FOLATE, FERRITIN, TIBC, IRON, RETICCTPCT,  in the last 72 hours  Coagulation profile  Recent Labs Lab 08/08/12 1653  INR 0.96    No results found for this basename: DDIMER,  in the last 72 hours  Cardiac  Enzymes  Recent Labs Lab 08/08/12 1653  TROPONINI <0.30   ------------------------------------------------------------------------------------------------------------------ No components found with this basename: POCBNP,

## 2012-08-10 ENCOUNTER — Inpatient Hospital Stay (HOSPITAL_COMMUNITY): Payer: Medicare Other

## 2012-08-10 LAB — GLUCOSE, CAPILLARY
Glucose-Capillary: 101 mg/dL — ABNORMAL HIGH (ref 70–99)
Glucose-Capillary: 248 mg/dL — ABNORMAL HIGH (ref 70–99)

## 2012-08-10 LAB — BASIC METABOLIC PANEL
BUN: 13 mg/dL (ref 6–23)
CO2: 27 mEq/L (ref 19–32)
Calcium: 10 mg/dL (ref 8.4–10.5)
Chloride: 102 mEq/L (ref 96–112)
Creatinine, Ser: 0.66 mg/dL (ref 0.50–1.10)
GFR calc Af Amer: >90 mL/min (ref >90)

## 2012-08-10 MED ORDER — DEXAMETHASONE SODIUM PHOSPHATE 4 MG/ML IJ SOLN
8.0000 mg | Freq: Two times a day (BID) | INTRAMUSCULAR | Status: DC
  Administered 2012-08-10 – 2012-08-11 (×2): 8 mg via INTRAVENOUS
  Filled 2012-08-10 (×4): qty 2

## 2012-08-10 MED ORDER — PREDNISONE 50 MG PO TABS
50.0000 mg | ORAL_TABLET | Freq: Every day | ORAL | Status: DC
  Administered 2012-08-10: 50 mg via ORAL
  Filled 2012-08-10 (×2): qty 1

## 2012-08-10 MED ORDER — INSULIN ASPART 100 UNIT/ML ~~LOC~~ SOLN: 0.0000 [IU] | Freq: Three times a day (TID) | SUBCUTANEOUS | Status: DC

## 2012-08-10 MED ORDER — FREESTYLE SYSTEM KIT: 1.0000 | PACK | Freq: Three times a day (TID) | Status: DC

## 2012-08-10 MED ORDER — OXYCODONE HCL 5 MG PO TABS: 5.0000 mg | ORAL_TABLET | Freq: Three times a day (TID) | ORAL | Status: DC | PRN

## 2012-08-10 MED ORDER — PREDNISONE 5 MG PO TABS: ORAL_TABLET | ORAL | Status: DC

## 2012-08-10 NOTE — Discharge Summary (Addendum)
[facility]                                                                                   Valerie Sullivan, is a 72 y.o. female  DOB [birth year]  MRN [id number].  Admission date:  08/08/2012  Discharge Date:  08/11/2012  Primary MD  Dorrene German, MD  Admitting Physician  Hillary Bow, DO  Admission Diagnosis  Right leg numbness [782.0]  Discharge Diagnosis     Principal Problem:   Right leg weakness Active Problems:   Diabetes mellitus   Hyperlipidemia   Soft tissue lesion    Past Medical History  Diagnosis Date  . Constipation   . Pelvic adhesions   . Diabetes mellitus   . Hypertension   . Arthritis     hips/neck    Past Surgical History  Procedure Laterality Date  . Partial hysterectomy       Recommendations for primary care physician for things to follow:       Discharge Diagnoses:   Principal Problem:   Right leg weakness Active Problems:   Diabetes mellitus   Hyperlipidemia   Soft tissue lesion    Discharge Condition: stable   Diet recommendation: See Discharge Instructions below   Consults neurosurgery Dr. Donalee Citrin over the phone    History of present illness and  Hospital Course:     Kindly see H&P for history of present illness and admission details, please review complete Labs, Consult reports and Test reports for all details in brief Valerie Sullivan, is a 72 y.o. female, patient with history of diabetes mellitus type 2, dyslipidemia, hypertension,  patient has had chronic weakness of lower extended the right more than left, she's had issues with walking long distances for a long time, she now comes here as husband things it's about time for her to get checked out. There is no acute change in her weakness, however they claim that weakness is gradually progressive over the last 2 years. She underwent MRI of her brain which was unremarkable followed by MRI of the L-spine report can be seen below which was suggestive of L.  and S. spine disc disease, her case was discussed with neurosurgeon Dr. Wynetta Emery on call who suggested that patient be placed on a steroid taper, be seen by physical therapy at home and then followup with him in the office which will be arranged. Patient has no acute change in her status, her pain is minimal, she has no bowel bladder incontinence. Plantars are still going down bilaterally. Her B12 and TSH are stable, RPR was nonreactive.     For diabetes mellitus type 2 she will continue her Glucophage actually her A1c was 5, she will be getting sliding scale insulin if needed while she is on steroids, diabetic and insulin education will be provided to patient and husband prior to discharge.   For her hypertension she will resume her home medication unchanged.   She has a soft tissue mass on her right flank which appears to be a lipoma versus abdominal fat, no dominant is worse suggested on L-spine MRI, I have requested her husband to continue following with her PCP  for this, and to consider one time outpatient followup with general surgeon. She and her husband agree with it.   Patient's husband post discharge in the evening yesterday changed his mind and said wants placement at rehab, it was requested, this am patient says I want home DC with home PT, which was the original plan, RN told, Case manager and S Work informed of both possibilities, paperwork done for DC.     Today   Subjective:   Valerie Sullivan today has no headache,no chest abdominal pain,no new weakness tingling or numbness, feels much better wants to go home today.    Objective:   Blood pressure 136/61, pulse 77, temperature 98 F (36.7 C), temperature source Oral, resp. rate 18, height 5\' 3"  (1.6 m), weight 71.5 kg (157 lb 10.1 oz), SpO2 99.00%.   Intake/Output Summary (Last 24 hours) at 08/11/12 0935 Last data filed at 08/11/12 0900  Gross per 24 hour  Intake    120 ml  Output      0 ml  Net    120 ml    Exam Awake  Alert, Oriented *3, No new F.N deficits, Normal affect [place].AT,PERRAL Supple Neck,No JVD, No cervical lymphadenopathy appriciated.  Symmetrical Chest wall movement, Good air movement bilaterally, CTAB RRR,No Gallops,Rubs or new Murmurs, No Parasternal Heave +ve B.Sounds, Abd Soft, Non tender, No organomegaly appriciated, No rebound -guarding or rigidity. No Cyanosis, Clubbing or edema, No new Rash or bruise  Data Review   Major procedures and Radiology Reports - PLEASE review detailed and final reports for all details in brief -       Ct Head (brain) Wo Contrast  08/08/2012   *RADIOLOGY REPORT*  Clinical Data: Weakness  CT HEAD WITHOUT CONTRAST  Technique:  Contiguous axial images were obtained from the base of the skull through the vertex without contrast.  Comparison: Head CT 11/06/2011  Findings: No acute intracranial hemorrhage.  No focal mass lesion. No CT evidence of acute infarction.   No midline shift or mass effect.  No hydrocephalus.  Basilar cisterns are patent.  There is a mild periventricular white matter hypodensities.  Paranasal sinuses and mastoid air cells are clear.  Orbits are normal.  There is several rounded ossific densities within the scalp not changed from prior.  IMPRESSION: No acute intracranial findings.  White matter hypodensities most consistent small vessel ischemia.   Original Report Authenticated By: Genevive Bi, M.D.   Mr Brain Wo Contrast  08/10/2012   *RADIOLOGY REPORT*  Clinical Data: Right leg weakness  MRI HEAD WITHOUT CONTRAST  Technique:  Multiplanar, multiecho pulse sequences of the brain and surrounding structures were obtained according to standard protocol without intravenous contrast.  Comparison: CT 08/08/2012, MRI 07/17/2010  Findings: Negative for acute infarct.  Mild atrophy.  Moderate chronic microvascular ischemic changes throughout the cerebral white matter.  Mild chronic microvascular ischemia in the pons.  Negative for intracranial  hemorrhage.  Negative for mass or edema. No shift of the midline structures.  Paranasal sinuses are clear.  IMPRESSION: Chronic microvascular ischemia.  No acute abnormality.   Original Report Authenticated By: Janeece Riggers, M.D.   Mr Lumbar Spine Wo Contrast  08/10/2012   *RADIOLOGY REPORT*  Clinical Data: Right leg weakness  MRI LUMBAR SPINE WITHOUT CONTRAST  Technique:  Multiplanar and multiecho pulse sequences of the lumbar spine were obtained without intravenous contrast.  Comparison: Lumbar MRI 11/08/2011  Findings: Normal lumbar alignment with straightening of the lumbar lordosis.  Negative for fracture or mass lesion.  Conus medullaris is normal and terminates at L1.  T12-L1:  Small central disc protrusion, unchanged  L1-2:  Mild disc and facet degeneration without significant stenosis  L2-3:  Mild disc and facet degeneration without stenosis.  Slight anterior slip is present.  L3-4:  Mild disc degeneration with disc bulging and mild spondylosis.  Mild facet hypertrophy and mild spinal stenosis, unchanged  L4-5:  Mild disc bulging and spondylosis.  Mild foraminal narrowing is present on the left.  There is mild facet hypertrophy.  L5-S1:  Right-sided disc protrusion.  There is an extruded disc fragment extending caudally on the right with compression of the right S1 nerve root.  This was not present previously.  IMPRESSION: Moderately large disc protrusion on the right L5-S1.  There is an extruded disc fragment extending caudally compressing the right S1 nerve root.  Lumbar degenerative changes elsewhere are stable.   Original Report Authenticated By: Janeece Riggers, M.D.    Micro Results      No results found for this or any previous visit (from the past 240 hour(s)).   CBC w Diff: Lab Results  Component Value Date   WBC 7.7 08/08/2012   WBC 7.0 04/16/2011   HGB 11.6* 08/08/2012   HGB 12.2 04/16/2011   HCT 34.0* 08/08/2012   HCT 35.5 04/16/2011   PLT 125* 08/08/2012   PLT 156 04/16/2011    LYMPHOPCT 37 08/08/2012   LYMPHOPCT 35.1 04/16/2011   MONOPCT 6 08/08/2012   MONOPCT 6.1 04/16/2011   EOSPCT 3 08/08/2012   EOSPCT 2.1 04/16/2011   BASOPCT 0 08/08/2012   BASOPCT 0.6 04/16/2011    CMP: Lab Results  Component Value Date   NA 140 08/11/2012   K 3.8 08/11/2012   CL 103 08/11/2012   CO2 26 08/11/2012   BUN 19 08/11/2012   CREATININE 0.59 08/11/2012   PROT 6.8 08/08/2012   ALBUMIN 3.6 08/08/2012   BILITOT 0.3 08/08/2012   ALKPHOS 62 08/08/2012   AST 17 08/08/2012   ALT 10 08/08/2012  .   Discharge Instructions     Follow with Primary MD Dorrene German, MD or as suggested by case manager  in 7 days   Get CBC, CMP, checked 7 days by Primary MD and again as instructed by your Primary MD.    Get Medicines reviewed and adjusted.  Please request your Prim.MD to go over all Hospital Tests and Procedure/Radiological results at the follow up, please get all Hospital records sent to your Prim MD by signing hospital release before you go home.  Activity: As tolerated with Full fall precautions use walker/cane & assistance as needed  Accuchecks 4 times/day, Once in AM empty stomach and then before each meal. Log in all results and show them to your Prim.MD in 3 days. If any glucose reading is under 80 or above 300 call your Prim MD immidiately. Follow Low glucose instructions for glucose under 80 as instructed.  Diet:  Low carb Heart Healthy  For Heart failure patients - Check your Weight same time everyday, if you gain over 2 pounds, or you develop in leg swelling, experience more shortness of breath or chest pain, call your Primary MD immediately. Follow Cardiac Low Salt Diet and 1.8 lit/day fluid restriction.  Disposition Home    If you experience worsening of your admission symptoms, develop shortness of breath, life threatening emergency, suicidal or homicidal thoughts you must seek medical attention immediately by calling 911 or calling your MD immediately  if symptoms less  severe.  You Must read complete instructions/literature along with all the possible adverse reactions/side effects for all the Medicines you take and that have been prescribed to you. Take any new Medicines after you have completely understood and accpet all the possible adverse reactions/side effects.   Do not drive and provide baby sitting services if your were admitted for syncope or siezures until you have seen by Primary MD or a Neurologist and advised to do so again.  Do not drive when taking Pain medications.    Do not take more than prescribed Pain, Sleep and Anxiety Medications  Special Instructions: If you have smoked or chewed Tobacco  in the last 2 yrs please stop smoking, stop any regular Alcohol  and or any Recreational drug use.  Wear Seat belts while driving.       Follow-up Information   Follow up with AVBUERE,EDWIN A, MD. Schedule an appointment as soon as possible for a visit in 1 week. (Or as suggested by case manager)    Contact information:   [address] [place] [address] [phone]       Follow up with CRAM,GARY P, MD. Schedule an appointment as soon as possible for a visit in 1 week.   Contact information:   [address] [place] [address] [phone]         Discharge Medications     Medication List    TAKE these medications       aspirin 81 MG tablet  Take 81 mg by mouth daily.     atorvastatin 20 MG tablet  Commonly known as:  LIPITOR  Take 20 mg by mouth at bedtime.     citalopram 20 MG tablet  Commonly known as:  CELEXA  Take 20 mg by mouth daily.     glucose monitoring kit monitoring kit  1 each by Does not apply route 4 (four) times daily - after meals and at bedtime. 1 month Diabetic Testing Supplies for QAC-QHS accuchecks.     insulin aspart 100 UNIT/ML injection  Commonly known as:  novoLOG  Inject 0-10 Units into the skin 3 (three) times daily with meals. Before each meal 3 times a day, 140-199  - 2 units, 200-250 - 4 units, 251-299 - 6 units,  300-349 - 8 units,  350 or above 10 units.Insulin PEN if approved, provide syringes and needles if needed.     metFORMIN 500 MG tablet  Commonly known as:  GLUCOPHAGE  Take 500 mg by mouth daily with breakfast.     naproxen sodium 220 MG tablet  Commonly known as:  ANAPROX  Take 220 mg by mouth 2 (two) times daily as needed. pain     omeprazole 20 MG capsule  Commonly known as:  PRILOSEC  Take 20 mg by mouth daily.     oxyCODONE 5 MG immediate release tablet  Commonly known as:  Oxy IR/ROXICODONE  Take 1 tablet (5 mg total) by mouth every 8 (eight) hours as needed. For pain     predniSONE 5 MG tablet  Commonly known as:  DELTASONE  Label  & dispense according to the schedule below. 10 Pills PO for 3 days then, 8 Pills PO for 3 days, 6 Pills PO for 3 days, 4 Pills PO for 3 days, 2 Pills PO for 3 days, 1 Pills PO for 3 days, 1/2 Pill  PO for 3 days then STOP.     senna 8.6 MG Tabs  Commonly known as:  SENOKOT  Take 2 tablets by mouth daily as needed. As needed for constipation.     valsartan-hydrochlorothiazide 160-12.5 MG per tablet  Commonly known as:  DIOVAN-HCT  Take 1 tablet by mouth every morning.     Vitamin D (Ergocalciferol) 50000 UNITS Caps  Commonly known as:  DRISDOL  Take 50,000 Units by mouth every 7 (seven) days. Monday     zolpidem 10 MG tablet  Commonly known as:  AMBIEN  Take 10 mg by mouth at bedtime as needed. For sleep           Total Time in preparing paper work, data evaluation and todays exam - 35 minutes  Leroy Sea M.D on 08/11/2012 at 9:35 AM  [facility] Office  [phone]

## 2012-08-10 NOTE — Progress Notes (Signed)
Inpatient Diabetes Program Recommendations  AACE/ADA: New Consensus Statement on Inpatient Glycemic Control (2013)  Target Ranges:  Prepandial:   less than 140 mg/dL      Peak postprandial:   less than 180 mg/dL (1-2 hours)      Critically ill patients:  140 - 180 mg/dL   Reason for Visit: insulin teaching   Note: Talked with patient and her husband about diabetes and insulin.  Patient reports that she was started on insulin in 2000 at the time of diagnosis but over the years she did not continue to need insulin and currently takes Metformin for diabetes management.  Instructed patient on insulin injection via insulin pen.  Patient was knowledgeable about how to use an insulin pen as that is what she used in the past.  In the past, patient did not prime insulin pen with 2 units of insulin.  Patient is able to teach back proper technique for insulin pen utilization.  Since patient will likely only be on insulin short term while she takes Prednisone, patient was provided with a free trial offer for Humalog pens.  Patient and her husband verbalized understanding of information discussed and have no further questions at this time.  Thanks, Orlando Penner, RN, MSN, CCRN Diabetes Coordinator Inpatient Diabetes Program [phone]

## 2012-08-10 NOTE — Progress Notes (Signed)
I have read and agree with the below treatment note and plan. Fabyan Loughmiller Helen Whitlow PT, DPT Pager: [phone] 

## 2012-08-10 NOTE — Progress Notes (Signed)
Physical Therapy Treatment Patient Details Name: Valerie Sullivan MRN: [id number] DOB: 1940/12/09 Today's Date: 08/10/2012 Time: 9242-6834 PT Time Calculation (min): 34 min  PT Assessment / Plan / Recommendation Comments on Treatment Session  Pt admitted with increased right LE weakness. MRI showing disc protrusion on the right L5-S1. Pt stating the pain is mostly in her back. Transfers and ambulation with min guard but limited by pain and fatigue. Provided education to pt on log rolling technique. Education provided on fall risk, d/c planning and return home, supervision/assistance 24 hr, and HHPT. Pt husband initially sleeping and awakened for education. Husband seemingly in and out of sleep during this time and became agitated and defensive when therapist repeating information for clarification. Husband adamantly stating "I will take care of her and keep her safe by myself".  Acute PT to maximize functional mobility and saftey for d/c home.    Follow Up Recommendations  Supervision/Assistance - 24 hour;Home health PT        Barriers to Discharge  Husband with limited physical ability to assist      Equipment Recommendations  Other (comment) (4 wheeled walker (rollator))       Frequency Min 3X/week   Plan Discharge plan needs to be updated    Precautions / Restrictions Precautions Precautions: Fall Restrictions Weight Bearing Restrictions: No   Pertinent Vitals/Pain Pt verbalizing pain in lumbar region. Just given medication prior to treatment.    Mobility  Bed Mobility Bed Mobility: Rolling Left;Left Sidelying to Sit;Sitting - Scoot to Edge of Bed Rolling Left: 4: Min assist (v/c for log roll technique. Min assist at hips for roll) Left Sidelying to Sit: 3: Mod assist Sitting - Scoot to Edge of Bed: 4: Min guard Details for Bed Mobility Assistance: v/c and t/c for sequencing and technique Transfers Transfers: Sit to Stand;Stand to Sit Sit to Stand: 4: Min guard;From  bed Stand to Sit: 4: Min guard;To chair/3-in-1;To bed Details for Transfer Assistance: v/c for safe hand placement and sequencing. Pt remaining flexed at the waist in standing to to pain in lumbar region Ambulation/Gait Ambulation/Gait Assistance: 4: Min guard Ambulation Distance (Feet): 10 Feet Assistive device: Rolling walker Ambulation/Gait Assistance Details: v/c for posture. Pt initially limiting stance and walking due to pain, but when asked to walk across the room to wake her husband she ambulated with min guard. Gait Pattern: Decreased stride length;Decreased stance time - right;Decreased weight shift to right;Antalgic;Step-through pattern Gait velocity: decreased Stairs: No Wheelchair Mobility Wheelchair Mobility: No     PT Goals Acute Rehab PT Goals PT Goal: Supine/Side to Sit - Progress: Progressing toward goal PT Goal: Sit to Stand - Progress: Met PT Goal: Stand to Sit - Progress: Progressing toward goal PT Goal: Ambulate - Progress: Progressing toward goal  Visit Information  Last PT Received On: 08/10/12 Assistance Needed: +1    Subjective Data  Subjective: It hurts to stand up straight. Patient Stated Goal: return home   Cognition  Cognition Arousal/Alertness: Awake/alert Behavior During Therapy: WFL for tasks assessed/performed Overall Cognitive Status: History of cognitive impairments - at baseline    Balance  Static Sitting Balance Static Sitting - Balance Support: No upper extremity supported;Feet unsupported Static Sitting - Level of Assistance: 5: Stand by assistance Static Sitting - Comment/# of Minutes: 3 minutes Static Standing Balance Static Standing - Balance Support: Bilateral upper extremity supported Static Standing - Level of Assistance: 5: Stand by assistance Static Standing - Comment/# of Minutes: 1 minute  End of Session PT -  End of Session Equipment Utilized During Treatment: Gait belt Activity Tolerance: Patient limited by  fatigue;Patient limited by pain Patient left: in chair;with call bell/phone within reach;with family/visitor present;Other (comment) (with other staff in the room) Nurse Communication: Mobility status   GP     Payton Doughty 08/10/2012, 11:56 AM Payton Doughty, PT Student

## 2012-08-10 NOTE — Progress Notes (Signed)
Pt was having some more pain and weakness to her right leg this afternoon, took two people to assist her to go to bathroom. Gave her pain med and scheduled Decadron. Her husband was adamant about he is not going to take her home today. Informed Dr.Singh, received order to keep the pt for to night and start IV Decadron. Also offered social worker consultation for rehab placement, but pt and husband refused that offer. Pt is feeling much better this evening and walked to bathroom with one assist.  Danne [place]

## 2012-08-11 LAB — GLUCOSE, CAPILLARY

## 2012-08-11 LAB — BASIC METABOLIC PANEL
CO2: 26 mEq/L (ref 19–32)
Calcium: 10.2 mg/dL (ref 8.4–10.5)
Creatinine, Ser: 0.59 mg/dL (ref 0.50–1.10)
GFR calc non Af Amer: >90 mL/min (ref >90)
Glucose, Bld: 138 mg/dL — ABNORMAL HIGH (ref 70–99)
Sodium: 140 mEq/L (ref 135–145)

## 2012-08-11 NOTE — Care Management Note (Signed)
    Page 1 of 2   08/11/2012     10:33:07 AM   CARE MANAGEMENT NOTE 08/11/2012  Patient:  Valerie Sullivan, Valerie Sullivan   Account Number:  [id number]  Date Initiated:  08/11/2012  Documentation initiated by:  [facility]  Subjective/Objective Assessment:   admitted with rt leg numbness  lives with spouse who is her main care giver     Action/Plan:   PT/OT evals-recommended HHPT, HHOT   Anticipated DC Date:  08/11/2012   Anticipated DC Plan:  HOME W HOME HEALTH SERVICES  In-house referral  Clinical Social Worker      DC Planning Services  CM consult      Choice offered to / List presented to:  C-3 Spouse   DME arranged  WALKER - Lavone Nian      DME agency  [facility]     HH arranged  HH-2 PT  HH-3 OT  HH-6 SOCIAL WORKER      HH agency  [facility]   Status of service:  Completed, signed off Medicare Important Message given?   (If response is "NO", the following Medicare IM given date fields will be blank) Date Medicare IM given:   Date Additional Medicare IM given:    Discharge Disposition:  HOME W HOME HEALTH SERVICES  Per UR Regulation:    If discussed at Long Length of Stay Meetings, dates discussed:    Comments:  08/11/12 Spoke with patient  and her husband about HHC. They chose [facility] from [facility] agencies list. Dava Najjar with [facility] and set up HHPT, HHOT and SW. Walker delivered to room by [facility]. Jacquelynn Cree RN, BSN, CCM

## 2012-08-11 NOTE — Progress Notes (Signed)
Referred to this CSW today for ?SNF. Chart reviewed and have spoken with patient and RNCM. Plans are for d/c home with [facility] and DME. CSW to sign off- please contact us if SW needs arise. Reece Levy, MSW, Theresia Majors [phone]

## 2012-08-11 NOTE — Progress Notes (Addendum)
D/c instructions reviewed with pt and her husband. Husband did not allow staff RN to read all info to him, as he stated " I can read, I'm competent ", reviewed meds to be given, attempted discussion of insulin sliding scale, again husband stated " I understand, I can read the information, I'm competent". Pt and husband had been given instructions on insulin administration, see DM Coordinators progress note.  Pt and husband given copy of instructions and scripts, pt d/c'd via wheelchair with belongings, escorted by unit NT.   (pt was not here for stroke, negative scans for CVA, therefore stroke education not provided and NIH not needed, see MRI spine results)

## 2012-08-11 NOTE — Progress Notes (Signed)
Physical Therapy Treatment and Discharge Patient Details Name: Valerie Sullivan MRN: [id number] DOB: 1940/08/10 Today's Date: 08/11/2012 Time: 1478-2956 PT Time Calculation (min): 18 min  PT Assessment / Plan / Recommendation Comments on Treatment Session  Pt admitted with increased right LE weakness. MRI showing disc protrusion on the right L5-S1. Pt with much less pain today. Ambulating with rollator at supervision level. Wants to go home with HHPT. Per MD plan is for d/c home today. All goals met. Husband can provide 24 hour assist at home. No further acute PT needs at this time. PT signing off.     Follow Up Recommendations  Home health PT;Supervision/Assistance - 24 hour     Does the patient have the potential to tolerate intense rehabilitation     Barriers to Discharge        Equipment Recommendations   (rollator ))    Recommendations for Other Services    Frequency Min 3X/week   Plan Discharge plan remains appropriate;All goals met and education completed, patient dischaged from PT services    Precautions / Restrictions Precautions Precautions: Fall Restrictions Weight Bearing Restrictions: No   Pertinent Vitals/Pain Minimal back pain, no leg pain    Mobility  Bed Mobility Bed Mobility: Supine to Sit Supine to Sit: 7: Independent Sitting - Scoot to Edge of Bed: 6: Modified independent (Device/Increase time) Details for Bed Mobility Assistance: pt sat straight up into long sitting from a flat bed independently, was able to swing legs over to EOB slowly but independently Transfers Transfers: Sit to Stand;Stand to Sit Sit to Stand: 5: Supervision;With upper extremity assist Stand to Sit: With upper extremity assist;5: Supervision Details for Transfer Assistance: cues for hand placement and safety with rollator (brakes) Ambulation/Gait Ambulation/Gait Assistance: 5: Supervision Ambulation Distance (Feet): 150 Feet Assistive device: 4-wheeled walker Ambulation/Gait  Assistance Details: cues for tall posture and safe proximity to rollator Gait Pattern: Step-through pattern;Trunk flexed Gait velocity: decreased General Gait Details: slow but much improved from yesterday with decreased pain Stairs: No      PT Goals Acute Rehab PT Goals PT Goal: Supine/Side to Sit - Progress: Met PT Goal: Sit to Stand - Progress: Met PT Goal: Stand to Sit - Progress: Met PT Goal: Ambulate - Progress: Met  Visit Information  Last PT Received On: 08/11/12 Assistance Needed: +1    Subjective Data  Subjective: The doctor made mention of me having therapy at home.  Patient Stated Goal: home today   Cognition  Cognition Arousal/Alertness: Awake/alert Behavior During Therapy: WFL for tasks assessed/performed Overall Cognitive Status: History of cognitive impairments - at baseline    Balance     End of Session PT - End of Session Equipment Utilized During Treatment: Gait belt Activity Tolerance: Patient tolerated treatment well Patient left: in chair;with call bell/phone within reach;Other (comment) (nursing tech in the room)   GP     [facility] HELEN 08/11/2012, 10:29 AM

## 2012-08-19 ENCOUNTER — Other Ambulatory Visit: Payer: Self-pay | Admitting: Neurosurgery

## 2012-09-09 ENCOUNTER — Encounter (HOSPITAL_COMMUNITY): Payer: Self-pay

## 2012-09-14 ENCOUNTER — Encounter (HOSPITAL_COMMUNITY): Payer: Self-pay

## 2012-09-14 ENCOUNTER — Encounter (HOSPITAL_COMMUNITY)
Admission: RE | Admit: 2012-09-14 | Discharge: 2012-09-14 | Disposition: A | Discharge status: Home / Self Care | Payer: Medicare Other | Source: Ambulatory Visit | Attending: Neurosurgery | Admitting: Neurosurgery

## 2012-09-14 ENCOUNTER — Encounter (HOSPITAL_COMMUNITY)
Admission: RE | Admit: 2012-09-14 | Discharge: 2012-09-14 | Disposition: A | Discharge status: Home / Self Care | Payer: Medicare Other | Source: Ambulatory Visit | Attending: Anesthesiology | Admitting: Anesthesiology

## 2012-09-14 LAB — CBC
Hemoglobin: 12.2 g/dL (ref 12.0–15.0)
MCH: 32.5 pg (ref 26.0–34.0)
MCV: 94.4 fL (ref 78.0–100.0)
Platelets: 148 10*3/uL — ABNORMAL LOW (ref 150–400)
RBC: 3.75 MIL/uL — ABNORMAL LOW (ref 3.87–5.11)
WBC: 7 10*3/uL (ref 4.0–10.5)

## 2012-09-14 LAB — BASIC METABOLIC PANEL
CO2: 27 mEq/L (ref 19–32)
GFR calc non Af Amer: 87 mL/min — ABNORMAL LOW (ref >90)
Glucose, Bld: 142 mg/dL — ABNORMAL HIGH (ref 70–99)
Potassium: 4 mEq/L (ref 3.5–5.1)
Sodium: 142 mEq/L (ref 135–145)

## 2012-09-14 LAB — SURGICAL PCR SCREEN: MRSA, PCR: NEGATIVE

## 2012-09-14 NOTE — Pre-Procedure Instructions (Signed)
Valerie Sullivan  09/14/2012   Your procedure is scheduled on:  September 23, 2012  Report to [facility] at 11:45 AM.  Call this number if you have problems the morning of surgery: [phone]   Remember:   Do not eat food or drink liquids after midnight.   Take these medicines the morning of surgery with A SIP OF WATER: citalopram (CELEXA) 20 MG tablet, omeprazole (PRILOSEC) 20 MG capsule, oxyCODONE (OXY  IR/ROXICODONE) 5 MG immediate release tablet    Do not wear jewelry, make-up or nail polish.  Do not wear lotions, powders, or perfumes. You may wear deodorant.  Do not shave 48 hours prior to surgery. Men may shave face and neck.  Do not bring valuables to the hospital.  [facility] is not responsible for any belongings or valuables.  Contacts, dentures or bridgework may not be worn into surgery.  Leave suitcase in the car. After surgery it may be brought to your room.  For patients admitted to the hospital, checkout time is 11:00 AM the day of discharge.   Patients discharged the day of surgery will not be allowed to drive home.  Name and phone number of your driver: family/friend  Special Instructions: Shower using CHG 2 nights before surgery and the night before surgery.  If you shower the day of surgery use CHG.  Use special wash - you have one bottle of CHG for all showers.  You should use approximately 1/3 of the bottle for each shower.   Please read over the following fact sheets that you were given: Pain Booklet, Coughing and Deep Breathing and Surgical Site Infection Prevention

## 2012-09-15 ENCOUNTER — Encounter (HOSPITAL_COMMUNITY): Payer: Medicare Other

## 2012-09-15 NOTE — Progress Notes (Signed)
Have tried numerous times to notify patient of positive staph.  Phone rings continuously and then beeps.  No answering machine.  I have notified Dr. Lonie Peak office and spoke with Dia Sitter and she will have a physician call the rx into [facility] on [address] (pharmacy that was listed in patient's chart).  Will continue to try to contact pt.

## 2012-09-22 MED ORDER — CEFAZOLIN SODIUM-DEXTROSE 2-3 GM-% IV SOLR
2.0000 g | INTRAVENOUS | Status: AC
  Administered 2012-09-23: 2 g via INTRAVENOUS
  Filled 2012-09-22: qty 50

## 2012-09-23 ENCOUNTER — Ambulatory Visit (HOSPITAL_COMMUNITY): Payer: Medicare Other

## 2012-09-23 ENCOUNTER — Encounter (HOSPITAL_COMMUNITY): Payer: Self-pay | Admitting: Anesthesiology

## 2012-09-23 ENCOUNTER — Encounter (HOSPITAL_COMMUNITY)
Admission: RE | Disposition: A | Discharge status: Home Health | Payer: Self-pay | Source: Ambulatory Visit | Attending: Neurosurgery

## 2012-09-23 ENCOUNTER — Ambulatory Visit (HOSPITAL_COMMUNITY): Payer: Medicare Other | Admitting: Anesthesiology

## 2012-09-23 ENCOUNTER — Ambulatory Visit (HOSPITAL_COMMUNITY)
Admission: RE | Admit: 2012-09-23 | Discharge: 2012-09-28 | Disposition: A | Discharge status: Home Health | Payer: Medicare Other | Source: Ambulatory Visit | Attending: Neurosurgery | Admitting: Neurosurgery

## 2012-09-23 ENCOUNTER — Encounter (HOSPITAL_COMMUNITY): Payer: Self-pay | Admitting: *Deleted

## 2012-09-23 DIAGNOSIS — Z01818 Encounter for other preprocedural examination: Secondary | ICD-10-CM | POA: Insufficient documentation

## 2012-09-23 DIAGNOSIS — I10 Essential (primary) hypertension: Secondary | ICD-10-CM | POA: Insufficient documentation

## 2012-09-23 DIAGNOSIS — M5126 Other intervertebral disc displacement, lumbar region: Secondary | ICD-10-CM | POA: Insufficient documentation

## 2012-09-23 DIAGNOSIS — Z79899 Other long term (current) drug therapy: Secondary | ICD-10-CM | POA: Insufficient documentation

## 2012-09-23 DIAGNOSIS — M1990 Unspecified osteoarthritis, unspecified site: Secondary | ICD-10-CM

## 2012-09-23 DIAGNOSIS — Z23 Encounter for immunization: Secondary | ICD-10-CM | POA: Insufficient documentation

## 2012-09-23 DIAGNOSIS — E119 Type 2 diabetes mellitus without complications: Secondary | ICD-10-CM | POA: Insufficient documentation

## 2012-09-23 DIAGNOSIS — Z01812 Encounter for preprocedural laboratory examination: Secondary | ICD-10-CM | POA: Insufficient documentation

## 2012-09-23 HISTORY — PX: LUMBAR LAMINECTOMY/DECOMPRESSION MICRODISCECTOMY: SHX5026

## 2012-09-23 LAB — GLUCOSE, CAPILLARY
Glucose-Capillary: 116 mg/dL — ABNORMAL HIGH (ref 70–99)
Glucose-Capillary: 138 mg/dL — ABNORMAL HIGH (ref 70–99)

## 2012-09-23 SURGERY — LUMBAR LAMINECTOMY/DECOMPRESSION MICRODISCECTOMY 1 LEVEL
Anesthesia: General | Site: Spine Lumbar | Laterality: Right | Wound class: Clean

## 2012-09-23 MED ORDER — BACITRACIN 50000 UNITS IM SOLR
INTRAMUSCULAR | Status: AC
  Filled 2012-09-23: qty 1

## 2012-09-23 MED ORDER — SODIUM CHLORIDE 0.9 % IR SOLN
Status: DC | PRN
  Administered 2012-09-23: 12:00:00

## 2012-09-23 MED ORDER — 0.9 % SODIUM CHLORIDE (POUR BTL) OPTIME
TOPICAL | Status: DC | PRN
  Administered 2012-09-23: 1000 mL

## 2012-09-23 MED ORDER — KETOROLAC TROMETHAMINE 15 MG/ML IJ SOLN
INTRAMUSCULAR | Status: DC | PRN
  Administered 2012-09-23: 15 mg via INTRAVENOUS

## 2012-09-23 MED ORDER — ASPIRIN 81 MG PO TABS: 81.0000 mg | ORAL_TABLET | Freq: Every day | ORAL | Status: DC

## 2012-09-23 MED ORDER — OXYCODONE HCL 5 MG/5ML PO SOLN: 5.0000 mg | Freq: Once | ORAL | Status: DC | PRN

## 2012-09-23 MED ORDER — HEMOSTATIC AGENTS (NO CHARGE) OPTIME
TOPICAL | Status: DC | PRN
  Administered 2012-09-23: 1 via TOPICAL

## 2012-09-23 MED ORDER — FENTANYL CITRATE 0.05 MG/ML IJ SOLN
INTRAMUSCULAR | Status: DC | PRN
Start: 2012-09-23 — End: 2012-09-23
  Administered 2012-09-23: 50 ug via INTRAVENOUS
  Administered 2012-09-23: 100 ug via INTRAVENOUS
  Administered 2012-09-23: 50 ug via INTRAVENOUS
  Administered 2012-09-23: 100 ug via INTRAVENOUS

## 2012-09-23 MED ORDER — CITALOPRAM HYDROBROMIDE 20 MG PO TABS
20.0000 mg | ORAL_TABLET | Freq: Every day | ORAL | Status: DC
  Administered 2012-09-23 – 2012-09-28 (×6): 20 mg via ORAL
  Filled 2012-09-23 (×6): qty 1

## 2012-09-23 MED ORDER — ONDANSETRON HCL 4 MG/2ML IJ SOLN: 4.0000 mg | INTRAMUSCULAR | Status: DC | PRN

## 2012-09-23 MED ORDER — DEXTROSE 5 % IV SOLN
INTRAVENOUS | Status: DC | PRN
  Administered 2012-09-23: 11:00:00 via INTRAVENOUS

## 2012-09-23 MED ORDER — FREESTYLE SYSTEM KIT: 1.0000 | PACK | Freq: Three times a day (TID) | Status: DC

## 2012-09-23 MED ORDER — DEXAMETHASONE SODIUM PHOSPHATE 10 MG/ML IJ SOLN
10.0000 mg | INTRAMUSCULAR | Status: AC
  Administered 2012-09-23: 10 mg via INTRAVENOUS

## 2012-09-23 MED ORDER — HYDROMORPHONE HCL PF 1 MG/ML IJ SOLN
0.5000 mg | INTRAMUSCULAR | Status: DC | PRN
  Administered 2012-09-24: 1 mg via INTRAVENOUS
  Filled 2012-09-23: qty 1

## 2012-09-23 MED ORDER — METFORMIN HCL 500 MG PO TABS
500.0000 mg | ORAL_TABLET | Freq: Every day | ORAL | Status: DC
  Administered 2012-09-24 – 2012-09-28 (×5): 500 mg via ORAL
  Filled 2012-09-23 (×6): qty 1

## 2012-09-23 MED ORDER — MIDAZOLAM HCL 5 MG/5ML IJ SOLN
INTRAMUSCULAR | Status: DC | PRN
  Administered 2012-09-23: 2 mg via INTRAVENOUS

## 2012-09-23 MED ORDER — BUPIVACAINE HCL (PF) 0.25 % IJ SOLN
INTRAMUSCULAR | Status: DC | PRN
  Administered 2012-09-23: 10 mL

## 2012-09-23 MED ORDER — ATORVASTATIN CALCIUM 20 MG PO TABS
20.0000 mg | ORAL_TABLET | Freq: Every day | ORAL | Status: DC
  Administered 2012-09-23 – 2012-09-27 (×5): 20 mg via ORAL
  Filled 2012-09-23 (×6): qty 1

## 2012-09-23 MED ORDER — SODIUM CHLORIDE 0.9 % IJ SOLN: 3.0000 mL | INTRAMUSCULAR | Status: DC | PRN

## 2012-09-23 MED ORDER — OXYCODONE-ACETAMINOPHEN 5-325 MG PO TABS
1.0000 | ORAL_TABLET | ORAL | Status: DC | PRN
  Administered 2012-09-23 (×2): 1 via ORAL
  Administered 2012-09-24: 2 via ORAL
  Administered 2012-09-24: 1 via ORAL
  Administered 2012-09-25 – 2012-09-28 (×12): 2 via ORAL
  Filled 2012-09-23: qty 1
  Filled 2012-09-23 (×11): qty 2
  Filled 2012-09-23: qty 1
  Filled 2012-09-23 (×3): qty 2

## 2012-09-23 MED ORDER — CYCLOBENZAPRINE HCL 10 MG PO TABS
10.0000 mg | ORAL_TABLET | Freq: Three times a day (TID) | ORAL | Status: DC | PRN
  Filled 2012-09-23: qty 1

## 2012-09-23 MED ORDER — IRBESARTAN 150 MG PO TABS
150.0000 mg | ORAL_TABLET | Freq: Every day | ORAL | Status: DC
  Administered 2012-09-23 – 2012-09-28 (×6): 150 mg via ORAL
  Filled 2012-09-23 (×6): qty 1

## 2012-09-23 MED ORDER — EPHEDRINE SULFATE 50 MG/ML IJ SOLN
INTRAMUSCULAR | Status: DC | PRN
  Administered 2012-09-23: 10 mg via INTRAVENOUS

## 2012-09-23 MED ORDER — PROPOFOL 10 MG/ML IV BOLUS
INTRAVENOUS | Status: DC | PRN
  Administered 2012-09-23: 160 mg via INTRAVENOUS

## 2012-09-23 MED ORDER — ZOLPIDEM TARTRATE 5 MG PO TABS
5.0000 mg | ORAL_TABLET | Freq: Every evening | ORAL | Status: DC | PRN
  Administered 2012-09-25: 5 mg via ORAL
  Filled 2012-09-23: qty 1

## 2012-09-23 MED ORDER — ROCURONIUM BROMIDE 100 MG/10ML IV SOLN
INTRAVENOUS | Status: DC | PRN
  Administered 2012-09-23: 50 mg via INTRAVENOUS

## 2012-09-23 MED ORDER — ARTIFICIAL TEARS OP OINT
TOPICAL_OINTMENT | OPHTHALMIC | Status: DC | PRN
  Administered 2012-09-23: 1 via OPHTHALMIC

## 2012-09-23 MED ORDER — SODIUM CHLORIDE 0.9 % IJ SOLN
3.0000 mL | Freq: Two times a day (BID) | INTRAMUSCULAR | Status: DC
  Administered 2012-09-24 – 2012-09-28 (×6): 3 mL via INTRAVENOUS

## 2012-09-23 MED ORDER — HYDROCHLOROTHIAZIDE 12.5 MG PO CAPS
12.5000 mg | ORAL_CAPSULE | Freq: Every day | ORAL | Status: DC
  Administered 2012-09-23 – 2012-09-28 (×6): 12.5 mg via ORAL
  Filled 2012-09-23 (×6): qty 1

## 2012-09-23 MED ORDER — PHENOL 1.4 % MT LIQD: 1.0000 | OROMUCOSAL | Status: DC | PRN

## 2012-09-23 MED ORDER — DEXAMETHASONE SODIUM PHOSPHATE 10 MG/ML IJ SOLN
INTRAMUSCULAR | Status: AC
  Filled 2012-09-23: qty 1

## 2012-09-23 MED ORDER — HYDROMORPHONE HCL PF 1 MG/ML IJ SOLN: 0.2500 mg | INTRAMUSCULAR | Status: DC | PRN

## 2012-09-23 MED ORDER — ACETAMINOPHEN 650 MG RE SUPP: 650.0000 mg | RECTAL | Status: DC | PRN

## 2012-09-23 MED ORDER — OXYCODONE HCL 5 MG PO TABS
5.0000 mg | ORAL_TABLET | ORAL | Status: DC | PRN
  Administered 2012-09-24: 5 mg via ORAL
  Filled 2012-09-23: qty 1

## 2012-09-23 MED ORDER — VALSARTAN-HYDROCHLOROTHIAZIDE 160-12.5 MG PO TABS: 1.0000 | ORAL_TABLET | Freq: Every morning | ORAL | Status: DC

## 2012-09-23 MED ORDER — CEFAZOLIN SODIUM 1-5 GM-% IV SOLN
1.0000 g | Freq: Three times a day (TID) | INTRAVENOUS | Status: AC
  Administered 2012-09-23 – 2012-09-24 (×2): 1 g via INTRAVENOUS
  Filled 2012-09-23 (×3): qty 50

## 2012-09-23 MED ORDER — LIDOCAINE-EPINEPHRINE 1 %-1:100000 IJ SOLN
INTRAMUSCULAR | Status: DC | PRN
  Administered 2012-09-23: 8 mL

## 2012-09-23 MED ORDER — SODIUM CHLORIDE 0.9 % IV SOLN
INTRAVENOUS | Status: AC
  Filled 2012-09-23: qty 500

## 2012-09-23 MED ORDER — INSULIN ASPART 100 UNIT/ML ~~LOC~~ SOLN
0.0000 [IU] | Freq: Three times a day (TID) | SUBCUTANEOUS | Status: DC
  Administered 2012-09-23: 6 [IU] via SUBCUTANEOUS
  Administered 2012-09-26 – 2012-09-27 (×2): 2 [IU] via SUBCUTANEOUS

## 2012-09-23 MED ORDER — ONDANSETRON HCL 4 MG/2ML IJ SOLN
INTRAMUSCULAR | Status: DC | PRN
  Administered 2012-09-23: 4 mg via INTRAVENOUS

## 2012-09-23 MED ORDER — OXYCODONE HCL 5 MG PO TABS: 5.0000 mg | ORAL_TABLET | Freq: Once | ORAL | Status: DC | PRN

## 2012-09-23 MED ORDER — GLYCOPYRROLATE 0.2 MG/ML IJ SOLN
INTRAMUSCULAR | Status: DC | PRN
  Administered 2012-09-23: 0.6 mg via INTRAVENOUS

## 2012-09-23 MED ORDER — NAPROXEN 250 MG PO TABS
250.0000 mg | ORAL_TABLET | Freq: Two times a day (BID) | ORAL | Status: DC | PRN
  Filled 2012-09-23: qty 1

## 2012-09-23 MED ORDER — SENNA 8.6 MG PO TABS
1.0000 | ORAL_TABLET | Freq: Every day | ORAL | Status: DC | PRN
  Administered 2012-09-25: 8.6 mg via ORAL
  Filled 2012-09-23 (×2): qty 1

## 2012-09-23 MED ORDER — NAPROXEN SODIUM 220 MG PO TABS: 220.0000 mg | ORAL_TABLET | Freq: Two times a day (BID) | ORAL | Status: DC | PRN

## 2012-09-23 MED ORDER — MENTHOL 3 MG MT LOZG: 1.0000 | LOZENGE | OROMUCOSAL | Status: DC | PRN

## 2012-09-23 MED ORDER — ASPIRIN EC 81 MG PO TBEC
81.0000 mg | DELAYED_RELEASE_TABLET | Freq: Every day | ORAL | Status: DC
  Administered 2012-09-23 – 2012-09-28 (×6): 81 mg via ORAL
  Filled 2012-09-23 (×6): qty 1

## 2012-09-23 MED ORDER — THROMBIN 5000 UNITS EX SOLR
CUTANEOUS | Status: DC | PRN
  Administered 2012-09-23 (×2): 5000 [IU] via TOPICAL

## 2012-09-23 MED ORDER — DOCUSATE SODIUM 100 MG PO CAPS
100.0000 mg | ORAL_CAPSULE | Freq: Two times a day (BID) | ORAL | Status: DC
  Administered 2012-09-23 – 2012-09-28 (×11): 100 mg via ORAL
  Filled 2012-09-23 (×13): qty 1

## 2012-09-23 MED ORDER — LIDOCAINE HCL (CARDIAC) 20 MG/ML IV SOLN
INTRAVENOUS | Status: DC | PRN
  Administered 2012-09-23: 70 mg via INTRAVENOUS
  Administered 2012-09-23: 30 mg via INTRAVENOUS

## 2012-09-23 MED ORDER — PANTOPRAZOLE SODIUM 40 MG PO TBEC
40.0000 mg | DELAYED_RELEASE_TABLET | Freq: Every day | ORAL | Status: DC
  Administered 2012-09-23 – 2012-09-28 (×6): 40 mg via ORAL
  Filled 2012-09-23 (×6): qty 1

## 2012-09-23 MED ORDER — NEOSTIGMINE METHYLSULFATE 1 MG/ML IJ SOLN
INTRAMUSCULAR | Status: DC | PRN
  Administered 2012-09-23: 5 mg via INTRAVENOUS

## 2012-09-23 MED ORDER — SODIUM CHLORIDE 0.9 % IV SOLN
250.0000 mL | INTRAVENOUS | Status: DC
  Administered 2012-09-24: 250 mL via INTRAVENOUS

## 2012-09-23 MED ORDER — ONDANSETRON HCL 4 MG/2ML IJ SOLN: 4.0000 mg | Freq: Four times a day (QID) | INTRAMUSCULAR | Status: DC | PRN

## 2012-09-23 MED ORDER — LACTATED RINGERS IV SOLN
INTRAVENOUS | Status: DC | PRN
  Administered 2012-09-23 (×2): via INTRAVENOUS

## 2012-09-23 MED ORDER — ACETAMINOPHEN 325 MG PO TABS: 650.0000 mg | ORAL_TABLET | ORAL | Status: DC | PRN

## 2012-09-23 MED ORDER — VITAMIN D (ERGOCALCIFEROL) 1.25 MG (50000 UNIT) PO CAPS
50000.0000 [IU] | ORAL_CAPSULE | ORAL | Status: DC
  Administered 2012-09-25: 50000 [IU] via ORAL
  Filled 2012-09-23: qty 1

## 2012-09-23 SURGICAL SUPPLY — 60 items
BAG DECANTER FOR FLEXI CONT (MISCELLANEOUS) ×2 IMPLANT
BENZOIN TINCTURE PRP APPL 2/3 (GAUZE/BANDAGES/DRESSINGS) ×2 IMPLANT
BLADE SURG 11 STRL SS (BLADE) ×2 IMPLANT
BLADE SURG ROTATE 9660 (MISCELLANEOUS) IMPLANT
BRUSH SCRUB EZ PLAIN DRY (MISCELLANEOUS) ×2 IMPLANT
BUR MATCHSTICK NEURO 3.0 LAGG (BURR) ×2 IMPLANT
BUR PRECISION FLUTE 6.0 (BURR) ×2 IMPLANT
CANISTER SUCTION 2500CC (MISCELLANEOUS) ×2 IMPLANT
CLOTH BEACON ORANGE TIMEOUT ST (SAFETY) ×2 IMPLANT
CONT SPEC 4OZ CLIKSEAL STRL BL (MISCELLANEOUS) ×2 IMPLANT
DECANTER SPIKE VIAL GLASS SM (MISCELLANEOUS) ×2 IMPLANT
DERMABOND ADHESIVE PROPEN (GAUZE/BANDAGES/DRESSINGS) ×1
DERMABOND ADVANCED (GAUZE/BANDAGES/DRESSINGS)
DERMABOND ADVANCED .7 DNX12 (GAUZE/BANDAGES/DRESSINGS) IMPLANT
DERMABOND ADVANCED .7 DNX6 (GAUZE/BANDAGES/DRESSINGS) ×1 IMPLANT
DRAPE LAPAROTOMY 100X72X124 (DRAPES) ×2 IMPLANT
DRAPE MICROSCOPE LEICA (MISCELLANEOUS) ×2 IMPLANT
DRAPE MICROSCOPE ZEISS OPMI (DRAPES) IMPLANT
DRAPE POUCH INSTRU U-SHP 10X18 (DRAPES) ×2 IMPLANT
DRAPE PROXIMA HALF (DRAPES) IMPLANT
DRAPE SURG 17X23 STRL (DRAPES) ×2 IMPLANT
DRSG OPSITE 4X5.5 SM (GAUZE/BANDAGES/DRESSINGS) ×2 IMPLANT
DURAPREP 26ML APPLICATOR (WOUND CARE) ×2 IMPLANT
ELECT REM PT RETURN 9FT ADLT (ELECTROSURGICAL) ×2
ELECTRODE REM PT RTRN 9FT ADLT (ELECTROSURGICAL) ×1 IMPLANT
GAUZE SPONGE 4X4 16PLY XRAY LF (GAUZE/BANDAGES/DRESSINGS) IMPLANT
GLOVE BIO SURGEON STRL SZ8 (GLOVE) ×4 IMPLANT
GLOVE BIOGEL PI IND STRL 7.0 (GLOVE) ×2 IMPLANT
GLOVE BIOGEL PI IND STRL 7.5 (GLOVE) ×1 IMPLANT
GLOVE BIOGEL PI INDICATOR 7.0 (GLOVE) ×2
GLOVE BIOGEL PI INDICATOR 7.5 (GLOVE) ×1
GLOVE ECLIPSE 7.5 STRL STRAW (GLOVE) ×4 IMPLANT
GLOVE EXAM NITRILE LRG STRL (GLOVE) IMPLANT
GLOVE EXAM NITRILE MD LF STRL (GLOVE) IMPLANT
GLOVE EXAM NITRILE XL STR (GLOVE) IMPLANT
GLOVE EXAM NITRILE XS STR PU (GLOVE) IMPLANT
GLOVE INDICATOR 8.5 STRL (GLOVE) ×2 IMPLANT
GLOVE SS BIOGEL STRL SZ 6.5 (GLOVE) ×2 IMPLANT
GLOVE SUPERSENSE BIOGEL SZ 6.5 (GLOVE) ×2
GOWN BRE IMP SLV AUR LG STRL (GOWN DISPOSABLE) ×2 IMPLANT
GOWN BRE IMP SLV AUR XL STRL (GOWN DISPOSABLE) ×6 IMPLANT
GOWN STRL REIN 2XL LVL4 (GOWN DISPOSABLE) IMPLANT
KIT BASIN OR (CUSTOM PROCEDURE TRAY) ×2 IMPLANT
KIT ROOM TURNOVER OR (KITS) ×2 IMPLANT
NEEDLE HYPO 22GX1.5 SAFETY (NEEDLE) ×2 IMPLANT
NEEDLE SPNL 22GX3.5 QUINCKE BK (NEEDLE) ×2 IMPLANT
NS IRRIG 1000ML POUR BTL (IV SOLUTION) ×2 IMPLANT
PACK LAMINECTOMY NEURO (CUSTOM PROCEDURE TRAY) ×2 IMPLANT
RUBBERBAND STERILE (MISCELLANEOUS) ×4 IMPLANT
SPONGE GAUZE 4X4 12PLY (GAUZE/BANDAGES/DRESSINGS) ×2 IMPLANT
SPONGE SURGIFOAM ABS GEL SZ50 (HEMOSTASIS) ×2 IMPLANT
STRIP CLOSURE SKIN 1/2X4 (GAUZE/BANDAGES/DRESSINGS) ×2 IMPLANT
SUT VIC AB 0 CT1 18XCR BRD8 (SUTURE) ×1 IMPLANT
SUT VIC AB 0 CT1 8-18 (SUTURE) ×1
SUT VIC AB 2-0 CT1 18 (SUTURE) ×2 IMPLANT
SUT VICRYL 4-0 PS2 18IN ABS (SUTURE) ×2 IMPLANT
SYR 20ML ECCENTRIC (SYRINGE) ×2 IMPLANT
TOWEL OR 17X24 6PK STRL BLUE (TOWEL DISPOSABLE) ×2 IMPLANT
TOWEL OR 17X26 10 PK STRL BLUE (TOWEL DISPOSABLE) ×2 IMPLANT
WATER STERILE IRR 1000ML POUR (IV SOLUTION) ×2 IMPLANT

## 2012-09-23 NOTE — Anesthesia Procedure Notes (Signed)
Procedure Name: Intubation Date/Time: 09/23/2012 11:24 AM Performed by: Wray Kearns A Pre-anesthesia Checklist: Patient identified, Timeout performed, Emergency Drugs available, Suction available and Patient being monitored Patient Re-evaluated:Patient Re-evaluated prior to inductionOxygen Delivery Method: Circle system utilized Preoxygenation: Pre-oxygenation with 100% oxygen Intubation Type: IV induction and Cricoid Pressure applied Ventilation: Mask ventilation without difficulty and Oral airway inserted - appropriate to patient size Laryngoscope Size: Mac and 4 Grade View: Grade I Tube type: Oral Tube size: 8.0 mm Number of attempts: 1 Airway Equipment and Method: Stylet Placement Confirmation: ETT inserted through vocal cords under direct vision,  breath sounds checked- equal and bilateral and positive ETCO2 Secured at: 22 cm Tube secured with: Tape Dental Injury: Teeth and Oropharynx as per pre-operative assessment

## 2012-09-23 NOTE — Anesthesia Preprocedure Evaluation (Signed)
Anesthesia Evaluation  Patient identified by MRN, date of birth, ID band Patient awake    Reviewed: Allergy & Precautions, H&P , NPO status , Patient's Chart, lab work & pertinent test results  Airway Mallampati: II  Neck ROM: full    Dental   Pulmonary          Cardiovascular hypertension,     Neuro/Psych    GI/Hepatic   Endo/Other  diabetes, Type 2  Renal/GU      Musculoskeletal  (+) Arthritis -,   Abdominal   Peds  Hematology   Anesthesia Other Findings   Reproductive/Obstetrics                           Anesthesia Physical Anesthesia Plan  ASA: III  Anesthesia Plan: General   Post-op Pain Management:    Induction: Intravenous  Airway Management Planned: Oral ETT  Additional Equipment:   Intra-op Plan:   Post-operative Plan:   Informed Consent: I have reviewed the patients History and Physical, chart, labs and discussed the procedure including the risks, benefits and alternatives for the proposed anesthesia with the patient or authorized representative who has indicated his/her understanding and acceptance.     Plan Discussed with: CRNA, Anesthesiologist and Surgeon  Anesthesia Plan Comments:         Anesthesia Quick Evaluation

## 2012-09-23 NOTE — Preoperative (Signed)
Beta Blockers   Reason not to administer Beta Blockers:Not Applicable 

## 2012-09-23 NOTE — H&P (Signed)
Valerie Sullivan is an 72 y.o. female.   Chief Complaint: Back and right leg pain HPI: 71 year old female who presents with progressive worsening back and right leg pain rating down the back of her leg to the bottom of the foot and outside of foot consistent with an S1 nerve root pattern. Workup revealed a large disc herniation L5-S1 the right displacing the right S1 nerve root she due to her failure of all forms of conservative treatment physical therapy epidural steroids and anti-inflammatories and her progression of clinical syndrome and imaging findings I recommended laminectomy microscope L5-S1 the right I extensively reviewed the risks and benefits of the operation with her as well as perioperative course expectations of outcome alternatives surgery and she understands and agrees to proceed forward.  Past Medical History  Diagnosis Date  . Constipation   . Pelvic adhesions   . Diabetes mellitus   . Hypertension   . Arthritis     hips/neck    Past Surgical History  Procedure Laterality Date  . Partial hysterectomy    . Uterine fibroid surgery  1992    tumor removed    Family History  Problem Relation Age of Onset  . Cancer Father     bone  . Heart disease Mother    Social History:  reports that she has never smoked. She has never used smokeless tobacco. She reports that she does not drink alcohol or use illicit drugs.  Allergies: No Known Allergies  Medications Prior to Admission  Medication Sig Dispense Refill  . aspirin 81 MG tablet Take 81 mg by mouth daily.      Marland Kitchen atorvastatin (LIPITOR) 20 MG tablet Take 20 mg by mouth at bedtime.       . citalopram (CELEXA) 20 MG tablet Take 20 mg by mouth daily.      Marland Kitchen glucose monitoring kit (FREESTYLE) monitoring kit 1 each by Does not apply route 4 (four) times daily - after meals and at bedtime. 1 month Diabetic Testing Supplies for QAC-QHS accuchecks.  1 each  1  . insulin aspart (NOVOLOG) 100 UNIT/ML injection Inject 0-10 Units  into the skin 3 (three) times daily with meals. Before each meal 3 times a day, 140-199 - 2 units, 200-250 - 4 units, 251-299 - 6 units,  300-349 - 8 units,  350 or above 10 units.Insulin PEN if approved, provide syringes and needles if needed.  1 vial  12  . metFORMIN (GLUCOPHAGE) 500 MG tablet Take 500 mg by mouth daily with breakfast.      . naproxen sodium (ANAPROX) 220 MG tablet Take 220 mg by mouth 2 (two) times daily as needed. pain      . omeprazole (PRILOSEC) 20 MG capsule Take 20 mg by mouth daily.       Marland Kitchen oxyCODONE (OXY IR/ROXICODONE) 5 MG immediate release tablet Take 1 tablet (5 mg total) by mouth every 8 (eight) hours as needed. For pain  15 tablet  0  . senna (SENOKOT) 8.6 MG TABS Take 2 tablets by mouth daily as needed. As needed for constipation.      . valsartan-hydrochlorothiazide (DIOVAN-HCT) 160-12.5 MG per tablet Take 1 tablet by mouth every morning.       . Vitamin D, Ergocalciferol, (DRISDOL) 50000 UNITS CAPS Take 50,000 Units by mouth every 7 (seven) days. Monday      . zolpidem (AMBIEN) 10 MG tablet Take 10 mg by mouth at bedtime as needed. For sleep  Results for orders placed during the hospital encounter of 09/23/12 (from the past 48 hour(s))  GLUCOSE, CAPILLARY     Status: Abnormal   Collection Time    09/23/12  8:40 AM      Result Value Range   Glucose-Capillary 116 (*) 70 - 99 mg/dL   No results found.  Review of Systems  Constitutional: Negative.   HENT: Negative.   Eyes: Negative.   Respiratory: Negative.   Cardiovascular: Negative.   Gastrointestinal: Negative.   Genitourinary: Negative.   Musculoskeletal: Positive for myalgias and back pain.  Neurological: Positive for tingling.  Endo/Heme/Allergies: Negative.   Psychiatric/Behavioral: Negative.     Blood pressure 152/81, pulse 79, temperature 98.3 F (36.8 C), temperature source Oral, resp. rate 20, SpO2 97.00%. Physical Exam  Constitutional: She is oriented to person, place, and time.  She appears well-developed.  HENT:  Head: Normocephalic.  Eyes: Pupils are equal, round, and reactive to light.  Neck: Normal range of motion.  Cardiovascular: Normal rate.   Respiratory: Effort normal.  GI: Soft.  Musculoskeletal: Normal range of motion.  Neurological: She is alert and oriented to person, place, and time. She has normal strength. She displays a negative Romberg sign. GCS eye subscore is 4. GCS verbal subscore is 5. GCS motor subscore is 6.  Reflex Scores:      Patellar reflexes are 0 on the right side and 0 on the left side.      Achilles reflexes are 0 on the right side and 0 on the left side. Strength is 5 out of 5 in her iliopsoas, quads, hip she's, gastrocs, anterior tibialis, EHL.  Skin: Skin is warm.     Assessment/Plan 72 year old female presents for right-sided L5-S1 laminectomy and discectomy  Valerie Sullivan P 09/23/2012, 11:13 AM

## 2012-09-23 NOTE — Anesthesia Postprocedure Evaluation (Signed)
Anesthesia Post Note  Patient: Valerie Sullivan  Procedure(s) Performed: Procedure(s) (LRB): LUMBAR LAMINECTOMY/DECOMPRESSION MICRODISCECTOMY 1 LEVEL (Right)  Anesthesia type: General  Patient location: PACU  Post pain: Pain level controlled and Adequate analgesia  Post assessment: Post-op Vital signs reviewed, Patient's Cardiovascular Status Stable, Respiratory Function Stable, Patent Airway and Pain level controlled  Last Vitals:  Filed Vitals:   09/23/12 1315  BP: 145/73  Pulse: 81  Temp:   Resp: 25    Post vital signs: Reviewed and stable  Level of consciousness: awake, alert  and oriented  Complications: No apparent anesthesia complications

## 2012-09-23 NOTE — Transfer of Care (Signed)
Immediate Anesthesia Transfer of Care Note  Patient: Valerie Sullivan  Procedure(s) Performed: Procedure(s) with comments: LUMBAR LAMINECTOMY/DECOMPRESSION MICRODISCECTOMY 1 LEVEL (Right) - right lumbar five-sacral one  Patient Location: PACU  Anesthesia Type:General  Level of Consciousness: sedated, patient cooperative and responds to stimulation  Airway & Oxygen Therapy: Patient Spontanous Breathing and Patient connected to nasal cannula oxygen  Post-op Assessment: Report given to PACU RN, Post -op Vital signs reviewed and stable, Patient moving all extremities and Patient moving all extremities X 4  Post vital signs: Reviewed and stable  Complications: No apparent anesthesia complications

## 2012-09-23 NOTE — Op Note (Signed)
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  EBL: Minimal  History of present illness: Patient was a tear vasopressors and back and right leg pain ring of the back of her leg back out also boxes with S1 nerve root better images showed large disc herniation with a free fragment migrated caudally behind the S1 body displacing the S1 nerve roots pedicle. Patient failed all forms of conservative treatment and imaging findings progressive clinical syndrome for surgery but recommended limited microdiscectomy at this level I reviewed the risks and benefits of the operation as well as peri-operative Course expectations and alternatives surgery she understood and agreed to proceed forward.  Operative procedure: Patient enter your was induced a general anesthesia positioned prone the Wilson frame her back was prepped and draped in routine sterile fashion. Preoperative x-ray localizing proper levels after infiltration 10 cc lidocaine with epi a midline incision was made and Bovie electrocautery was used to take a subcutaneous tissue and subperiosteal dissection care lamina of L5 and S1 on the right. Intraoperative x-ray confirmed the L5-S1 disc space the inferior aspect lamina of L5 was removed with a 3 mm Kerrison punch as well as under biting of the medial facet complex and superior aspect of the S1 lamina and was removed with a Kerrison punch the ligament of flavum was identified and removed in piecemeal fashion. Under microscopic illumination the S1 nerve root was identified and dissected off of a very large disc fragment that migrated inferiorly displaced  underneath the S1 nerve root. This was teased with I nerve hook several additional fragments removed until there were no further fragments appreciated the S1 nerve root was widely decompressed the disc space was inspected and felt not to be herniated epidural veins are coagulated was copiously irrigated meticulous hemostasis was maintained and Gelfoam was placed on top of  dura the muscle and fascia were reapproximated with interrupted Vicryl and the skin was closed running 4 subcuticular benzoin and Steri-Strips were applied and the patient recovered in stable condition. At the end of the case all needle counts and sponge counts were correct.

## 2012-09-24 LAB — GLUCOSE, CAPILLARY
Glucose-Capillary: 125 mg/dL — ABNORMAL HIGH (ref 70–99)
Glucose-Capillary: 107 mg/dL — ABNORMAL HIGH (ref 70–99)
Glucose-Capillary: 106 mg/dL — ABNORMAL HIGH (ref 70–99)
Glucose-Capillary: 82 mg/dL (ref 70–99)

## 2012-09-24 NOTE — Progress Notes (Signed)
[name], female   DOB: Jul 20, 1940, 72 y.o.   MRN: [id number] Pt doing well. Back appropriately sore, no leg pain, no N/T/W. CCM. She is pleased.

## 2012-09-25 MED ORDER — WHITE PETROLATUM GEL
Status: AC
  Filled 2012-09-25: qty 5

## 2012-09-25 NOTE — Progress Notes (Signed)
Subjective: Patient reports patient is doing well she denies any leg pain or back is stiff and sore but getting better  Objective: Vital signs in last 24 hours: Temp:  [98 F (36.7 C)-98.8 F (37.1 C)] 98 F (36.7 C) (07/13 0657) Pulse Rate:  [62-63] 62 (07/13 0657) Resp:  [14-16] 16 (07/13 0657) BP: (116-127)/(62-64) 116/62 mmHg (07/13 0657) SpO2:  [92 %-93 %] 92 % (07/13 0657)  Intake/Output from previous day: 07/12 0701 - 07/13 0700 In: 480 [P.O.:480] Out: -  Intake/Output this shift:    awake alert oriented strength 5 out of 5 wound is clean and dry  Lab Results: No results found for this basename: WBC, HGB, HCT, PLT,  in the last 72 hours BMET No results found for this basename: NA, K, CL, CO2, GLUCOSE, BUN, CREATININE, CALCIUM,  in the last 72 hours  Studies/Results: No results found.  Assessment/Plan: Continue to mobilize physical therapy social services to determine placement first of the week   LOS: 2 days     Ragan Duhon P 09/25/2012, 12:08 PM

## 2012-09-26 LAB — GLUCOSE, CAPILLARY
Glucose-Capillary: 115 mg/dL — ABNORMAL HIGH (ref 70–99)
Glucose-Capillary: 149 mg/dL — ABNORMAL HIGH (ref 70–99)
Glucose-Capillary: 109 mg/dL — ABNORMAL HIGH (ref 70–99)

## 2012-09-26 MED ORDER — PNEUMOCOCCAL VAC POLYVALENT 25 MCG/0.5ML IJ INJ
0.5000 mL | INJECTION | INTRAMUSCULAR | Status: AC
  Administered 2012-09-27: 0.5 mL via INTRAMUSCULAR
  Filled 2012-09-26: qty 0.5

## 2012-09-26 MED ORDER — BISACODYL 10 MG RE SUPP
10.0000 mg | Freq: Every day | RECTAL | Status: DC | PRN
  Administered 2012-09-26: 10 mg via RECTAL
  Filled 2012-09-26: qty 1
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
## 2012-09-27 ENCOUNTER — Encounter (HOSPITAL_COMMUNITY): Payer: Self-pay | Admitting: Neurosurgery

## 2012-09-27 LAB — GLUCOSE, CAPILLARY
Glucose-Capillary: 107 mg/dL — ABNORMAL HIGH (ref 70–99)
Glucose-Capillary: 176 mg/dL — ABNORMAL HIGH (ref 70–99)

## 2012-09-27 MED ORDER — CYCLOBENZAPRINE HCL 10 MG PO TABS: 10.0000 mg | ORAL_TABLET | Freq: Three times a day (TID) | ORAL | Status: DC | PRN

## 2012-09-27 MED ORDER — OXYCODONE HCL 5 MG PO TABS: 5.0000 mg | ORAL_TABLET | Freq: Four times a day (QID) | ORAL | Status: DC | PRN

## 2012-09-27 NOTE — Progress Notes (Signed)
Subjective: Patient reports she is doing okay no back or leg pain she is at a little bit of abdominal discomfort but she did have a bowel movement last night  Objective: Vital signs in last 24 hours: Temp:  [98.2 F (36.8 C)-98.5 F (36.9 C)] 98.4 F (36.9 C) (07/15 0606) Pulse Rate:  [83-91] 91 (07/15 0606) Resp:  [16-18] 18 (07/15 0606) BP: (131-144)/(67-82) 131/74 mmHg (07/15 0606) SpO2:  [93 %-95 %] 95 % (07/15 0606)  Intake/Output from previous day:   Intake/Output this shift:    Awake alert strength 5 out of 5  Lab Results: No results found for this basename: WBC, HGB, HCT, PLT,  in the last 72 hours BMET No results found for this basename: NA, K, CL, CO2, GLUCOSE, BUN, CREATININE, CALCIUM,  in the last 72 hours  Studies/Results: No results found.  Assessment/Plan: Patient so very slow to mobilize is unclear to me whether she can be independent at home, I will wear a physical therapy and occupational therapy consult to assess mobility safety and held discharge planning [facility] home health needs maybe whether she stable to be home and independent.  LOS: 4 days     Miliano Cotten P 09/27/2012, 12:40 PM

## 2012-09-27 NOTE — Evaluation (Signed)
Physical Therapy Evaluation Patient Details Name: Valerie Sullivan MRN: [id number] DOB: 1940/11/14 Today's Date: 09/27/2012 Time: 4098-1191 PT Time Calculation (min): 31 min  PT Assessment / Plan / Recommendation History of Present Illness  Admitted for laminectomy; Noted complex social situation  Clinical Impression  Patient is s/p lumbar laminectomy surgery resulting in functional limitations due to the deficits listed below (see PT Problem List). Patient will benefit from skilled PT to increase their independence and safety with mobility to allow discharge to the venue listed below.       PT Assessment  Patient needs continued PT services    Follow Up Recommendations  Home health PT;Supervision/Assistance - 24 hour (24 hour reliable assist would be optimal; is SNF an option?)    Does the patient have the potential to tolerate intense rehabilitation      Barriers to Discharge Decreased caregiver support Please see also SW note; Pt's neice is looking in to Ms. Ritsema possibly dc'ing to another family member's home; still, pt seems to want to go to her own home -- We did discuss getting Home Health SW to visit, given questionable spouse behaviors,  as well as HHPT and possibly OT    Equipment Recommendations  Rolling walker with 5" wheels;3in1 (PT)    Recommendations for Other Services OT consult   Frequency Min 5X/week    Precautions / Restrictions Precautions Precaution Comments: Consider back prec for comfort with moviliry   Pertinent Vitals/Pain 5/10 back pain;  patient repositioned for comfort RN provided medication to assist with pain control       Mobility  Bed Mobility Bed Mobility: Sit to Supine Sit to Supine: 4: Min assist Details for Bed Mobility Assistance: Physical assist for getting LEs into bed Transfers Transfers: Sit to Stand;Stand to Sit Sit to Stand: 4: Min guard Stand to Sit: 4: Min guard Details for Transfer Assistance: Cues for posture, hand  placement, safety Ambulation/Gait Ambulation/Gait Assistance: 4: Min guard Ambulation Distance (Feet): 200 Feet Assistive device: Rolling walker Ambulation/Gait Assistance Details: adjusted size of RW for optimal fit; occasional cues for more upright posture, and to self-monitor for activity tolerance Gait Pattern: Step-through pattern;Decreased stride length Stairs: Yes Stairs Assistance: 4: Min guard Stair Management Technique: One rail Right;Forwards;Alternating pattern Number of Stairs: 3    Exercises     PT Diagnosis: Generalized weakness;Difficulty walking;Acute pain  PT Problem List: Decreased strength;Decreased activity tolerance;Decreased balance;Decreased mobility;Decreased knowledge of use of DME;Pain PT Treatment Interventions: DME instruction;Gait training;Stair training;Functional mobility training;Therapeutic activities;Therapeutic exercise;Patient/family education     PT Goals(Current goals can be found in the care plan section) Acute Rehab PT Goals Patient Stated Goal: Home PT Goal Formulation: With patient Time For Goal Achievement: 10/11/12 Potential to Achieve Goals: Good  Visit Information  Last PT Received On: 09/27/12 Assistance Needed: +1 History of Present Illness: Admitted for laminectomy; Noted complex social situation       Prior Functioning  Home Living Family/patient expects to be discharged to:: Private residence Living Arrangements: Spouse/significant other;Other relatives; Niece is looking into pt going to another family member's home Available Help at Discharge: Other (Comment) (to be determined) Type of Home: House Home Access: Stairs to enter [employer] of Steps: 3 Entrance Stairs-Rails: None Home Layout: One level Home Equipment: Walker - 4 wheels Prior Function Level of Independence: Needs assistance Communication Communication: No difficulties    Cognition  Cognition Arousal/Alertness: Awake/alert Behavior During  Therapy: WFL for tasks assessed/performed Overall Cognitive Status: Within Functional Limits for tasks assessed  Extremity/Trunk Assessment Upper Extremity Assessment Upper Extremity Assessment: Overall WFL for tasks assessed Lower Extremity Assessment Lower Extremity Assessment: Generalized weakness   Balance    End of Session PT - End of Session Activity Tolerance: Patient tolerated treatment well Patient left: in bed;with call bell/phone within reach;with family/visitor present Nurse Communication: Mobility status  GP Functional Assessment Tool Used: Clinical judgement Functional Limitation: Mobility: Walking and moving around Mobility: Walking and Moving Around Current Status ([id number]): At least 1 percent but less than 20 percent impaired, limited or restricted Mobility: Walking and Moving Around Goal Status [phone]): 0 percent impaired, limited or restricted   Van Clines [facility], [place] [phone]  09/27/2012, 4:45 PM

## 2012-09-27 NOTE — Care Management Note (Unsigned)
    Page 1 of 1   09/27/2012     3:56:06 PM   CARE MANAGEMENT NOTE 09/27/2012  Patient:  Valerie Sullivan, Valerie Sullivan   Account Number:  [id number]  Date Initiated:  09/27/2012  Documentation initiated by:  Jacquelynn Cree  Subjective/Objective Assessment:   admitted postop L5-S1 laminectomy     Action/Plan:   Anticipated DC Date:  09/27/2012   Anticipated DC Plan:  HOME W HOME HEALTH SERVICES  In-house referral  Clinical Social Worker      DC Planning Services  CM consult      Choice offered to / List presented to:  C-1 Patient        HH arranged  HH-1 RN  HH-2 PT  HH-3 OT  HH-4 NURSE'S AIDE  HH-6 SOCIAL WORKER      HH agency  [facility]   Status of service:  In process, will continue to follow Medicare Important Message given?   (If response is "NO", the following Medicare IM given date fields will be blank) Date Medicare IM given:   Date Additional Medicare IM given:    Discharge Disposition:    Per UR Regulation:    If discussed at Long Length of Stay Meetings, dates discussed:    Comments:  09/27/12 Spoke with patient and her niece about HHC. They chose [facility]. Contacted Haroun Cotham with [facility] and set up HHRN, HHPT, HHOT, Child psychotherapist and aide. Jacquelynn Cree RN, BSN, CCM

## 2012-09-28 ENCOUNTER — Encounter (HOSPITAL_COMMUNITY): Payer: Self-pay | Admitting: General Practice

## 2012-09-28 LAB — GLUCOSE, CAPILLARY: Glucose-Capillary: 111 mg/dL — ABNORMAL HIGH (ref 70–99)

## 2012-09-28 NOTE — Progress Notes (Signed)
[name], female   DOB: 03-22-1940, 72 y.o.   MRN: [id number] Patient stable for discharge we'll discharge home scheduled followup in one to 2 weeks. All social issues have been resolved investigated and controlled to the greatest of our ability to

## 2012-09-28 NOTE — Discharge Summary (Signed)
Physician Discharge Summary  [name] MRN: [id number] DOB/AGE: [birth year] 72 y.o.  Admit date: 09/23/2012 Discharge date: 09/28/2012  Admission Diagnoses: Right S1 radiculopathy from large ruptured disc L5-S1 right  Discharge Diagnoses: Same Active Problems:   * No active hospital problems. *   Discharged Condition: good  Hospital Course: Patient is admitted hospital and underwent a right L5-S1 microscope a. Postoperatively patient went to recovery in the floor on the floor she convalesced well healed from her surgery. His liver issues with regard to that he care of her husband and the questions of her environment being safe at home. Initially she was only the hospital and go but and lives with her daughter who lives in [place] but we have have a rehabilitation or skilled nursing facility lined up. However her functional status was proposed to be able be discharged to a rehabilitation or nursing facility. Ultimately social services and exhaustive approach to find alternative solutions patient requested to home with her husband. Patient was competent and not to make this decision. We had to buy the patient's wishes. We arranged for Adult Protective Services to visit the patient went she does home and for home health for evaluation both with medication management but also with physical therapy. Patient stable for discharge home will be followed up approximately 1-2 weeks.  Consults: Significant Diagnostic Studies: Treatments: Right-sided L5-S1 laminectomy and discectomy Discharge Exam: Blood pressure 114/47, pulse 71, temperature 98 F (36.7 C), temperature source Oral, resp. rate 16, SpO2 99.00%. Thank out of 5 wound clean and dry  Disposition: Home  Discharge Orders   Future Orders Complete By Expires     Face-to-face encounter (required for Medicare/Medicaid patients)  As directed     Comments:      I Maghan Jessee P certify that this patient is under my care and  that I, or a nurse practitioner or physician's assistant working with me, had a face-to-face encounter that meets the physician face-to-face encounter requirements with this patient on 09/27/2012. The encounter with the patient was in whole, or in part for the following medical condition(s) which is the primary reason for home health care (List medical condition): Lumbar disk disease    Questions:      The encounter with the patient was in whole, or in part, for the following medical condition, which is the primary reason for home health care:  lumbar disk disease    I certify that, based on my findings, the following services are medically necessary home health services:  Nursing    Physical therapy    My clinical findings support the need for the above services:  Pain interferes with ambulation/mobility    Further, I certify that my clinical findings support that this patient is homebound due to:  Unable to leave home safely without assistance    Reason for Medically Necessary Home Health Services:  Skilled Nursing- Changes in Medication/Medication Management    Therapy- Therapeutic Exercises to Increase Strength and Endurance    Therapy- Instruction on Safe use of Assistive Devices for ADLs    Therapy- Investment banker, operational, Patent examiner    Home Health  As directed     Questions:      To provide the following care/treatments:  PT    RN        Medication List         aspirin 81 MG tablet  Take 81 mg by mouth daily.     atorvastatin 20 MG tablet  Commonly known as:  LIPITOR  Take 20 mg by mouth at bedtime.     citalopram 20 MG tablet  Commonly known as:  CELEXA  Take 20 mg by mouth daily.     cyclobenzaprine 10 MG tablet  Commonly known as:  FLEXERIL  Take 1 tablet (10 mg total) by mouth 3 (three) times daily as needed for muscle spasms.     glucose monitoring kit monitoring kit  1 each by Does not apply route 4 (four) times daily - after meals and at bedtime. 1  month Diabetic Testing Supplies for QAC-QHS accuchecks.     insulin aspart 100 UNIT/ML injection  Commonly known as:  novoLOG  Inject 0-10 Units into the skin 3 (three) times daily with meals. Before each meal 3 times a day, 140-199 - 2 units, 200-250 - 4 units, 251-299 - 6 units,  300-349 - 8 units,  350 or above 10 units.Insulin PEN if approved, provide syringes and needles if needed.     metFORMIN 500 MG tablet  Commonly known as:  GLUCOPHAGE  Take 500 mg by mouth daily with breakfast.     naproxen sodium 220 MG tablet  Commonly known as:  ANAPROX  Take 220 mg by mouth 2 (two) times daily as needed. pain     omeprazole 20 MG capsule  Commonly known as:  PRILOSEC  Take 20 mg by mouth daily.     oxyCODONE 5 MG immediate release tablet  Commonly known as:  Oxy IR/ROXICODONE  Take 1 tablet (5 mg total) by mouth every 8 (eight) hours as needed. For pain     oxyCODONE 5 MG immediate release tablet  Commonly known as:  Oxy IR/ROXICODONE  Take 1 tablet (5 mg total) by mouth every 6 (six) hours as needed.     senna 8.6 MG Tabs  Commonly known as:  SENOKOT  Take 2 tablets by mouth daily as needed. As needed for constipation.     valsartan-hydrochlorothiazide 160-12.5 MG per tablet  Commonly known as:  DIOVAN-HCT  Take 1 tablet by mouth every morning.     Vitamin D (Ergocalciferol) 50000 UNITS Caps  Commonly known as:  DRISDOL  Take 50,000 Units by mouth every 7 (seven) days. Monday     zolpidem 10 MG tablet  Commonly known as:  AMBIEN  Take 10 mg by mouth at bedtime as needed. For sleep           Follow-up Information   Follow up with Nevaeh Korte P, MD.   Contact information:   [address] [place] [address] [phone]       Signed: Jianni Shelden P 09/28/2012, 12:13 PM

## 2012-11-30 ENCOUNTER — Emergency Department (INDEPENDENT_AMBULATORY_CARE_PROVIDER_SITE_OTHER)
Admission: EM | Admit: 2012-11-30 | Discharge: 2012-11-30 | Disposition: A | Discharge status: Home / Self Care | Payer: Medicare Other | Source: Home / Self Care | Attending: Emergency Medicine | Admitting: Emergency Medicine

## 2012-11-30 ENCOUNTER — Encounter (HOSPITAL_COMMUNITY): Payer: Self-pay | Admitting: *Deleted

## 2012-11-30 ENCOUNTER — Emergency Department (HOSPITAL_COMMUNITY)
Admission: EM | Admit: 2012-11-30 | Discharge: 2012-11-30 | Discharge status: Against Medical Advice | Payer: Medicare Other | Attending: Emergency Medicine | Admitting: Emergency Medicine

## 2012-11-30 ENCOUNTER — Encounter (HOSPITAL_COMMUNITY): Payer: Self-pay | Admitting: Emergency Medicine

## 2012-11-30 DIAGNOSIS — R109 Unspecified abdominal pain: Secondary | ICD-10-CM | POA: Insufficient documentation

## 2012-11-30 DIAGNOSIS — E119 Type 2 diabetes mellitus without complications: Secondary | ICD-10-CM | POA: Insufficient documentation

## 2012-11-30 DIAGNOSIS — I10 Essential (primary) hypertension: Secondary | ICD-10-CM | POA: Insufficient documentation

## 2012-11-30 LAB — URINE MICROSCOPIC-ADD ON

## 2012-11-30 LAB — CBC WITH DIFFERENTIAL/PLATELET
Basophils Absolute: 0 10*3/uL (ref 0.0–0.1)
Basophils Relative: 0 % (ref 0–1)
Eosinophils Absolute: 0.1 10*3/uL (ref 0.0–0.7)
HCT: 37.4 % (ref 36.0–46.0)
MCH: 33.5 pg (ref 26.0–34.0)
MCHC: 35.8 g/dL (ref 30.0–36.0)
Monocytes Absolute: 1 10*3/uL (ref 0.1–1.0)
Neutro Abs: 5.9 10*3/uL (ref 1.7–7.7)
Neutrophils Relative %: 51 % (ref 43–77)
RDW: 12.2 % (ref 11.5–15.5)

## 2012-11-30 LAB — URINALYSIS, ROUTINE W REFLEX MICROSCOPIC
Glucose, UA: NEGATIVE mg/dL
Ketones, ur: 15 mg/dL — AB
Nitrite: NEGATIVE
Specific Gravity, Urine: 1.036 — ABNORMAL HIGH (ref 1.005–1.030)
pH: 5.5 (ref 5.0–8.0)

## 2012-11-30 LAB — COMPREHENSIVE METABOLIC PANEL
AST: 22 U/L (ref 0–37)
Albumin: 4 g/dL (ref 3.5–5.2)
Chloride: 100 mEq/L (ref 96–112)
Creatinine, Ser: 0.75 mg/dL (ref 0.50–1.10)
Total Bilirubin: 0.4 mg/dL (ref 0.3–1.2)
Total Protein: 7.1 g/dL (ref 6.0–8.3)

## 2012-11-30 LAB — LIPASE, BLOOD: Lipase: 32 U/L (ref 11–59)

## 2012-11-30 NOTE — ED Provider Notes (Signed)
Medical screening examination/treatment/procedure(s) were performed by non-physician practitioner and as supervising physician I was immediately available for consultation/collaboration.  Leslee Home, M.D.  Reuben Likes, MD 11/30/12 [phone]

## 2012-11-30 NOTE — ED Notes (Signed)
The pt fell one  Week ago and still has periodic abd pain since the fall

## 2012-11-30 NOTE — ED Provider Notes (Signed)
CSN: [id number]     Arrival date & time 11/30/12  1718 History   First MD Initiated Contact with Patient 11/30/12 1802     No chief complaint on file.  (Consider location/radiation/quality/duration/timing/severity/associated sxs/prior Treatment) HPI Comments: 72 year old female is brought in by her husband for evaluation of severe lower abdominal pain. Patient has a history of dementia so the history of present illness is slightly difficult to obtain. About one week ago, she tried to "trot" across her living room when she fell. She tried to stand again and fell into a rocking chair. Since then, she has had intermittent severe squeezing/cramping pain across her lower abdomen. She has not had any time in the past week with no abdominal pain. He comes in waves, being severe 10 out of 10 pain that lasts for about 10 seconds, happening at least 3 times a day. There are no alleviating or exacerbating factors. The pain has woken her up from sleep numerous times in the past week. She denies fever, chills, NVD. She has been having normal bowel movements without pain. She also admits to chronic lower back pain.   Past Medical History  Diagnosis Date  . Constipation   . Pelvic adhesions   . Diabetes mellitus   . Hypertension   . Arthritis     hips/neck   Past Surgical History  Procedure Laterality Date  . Partial hysterectomy    . Uterine fibroid surgery  1992    tumor removed  . Lumbar laminectomy/decompression microdiscectomy Right 09/23/2012    Procedure: LUMBAR LAMINECTOMY/DECOMPRESSION MICRODISCECTOMY 1 LEVEL;  Surgeon: Mariam Dollar, MD;  Location: [facility];  Service: Neurosurgery;  Laterality: Right;  right lumbar five-sacral one   Family History  Problem Relation Age of Onset  . Cancer Father     bone  . Heart disease Mother    History  Substance Use Topics  . Smoking status: Never Smoker   . Smokeless tobacco: Never Used  . Alcohol Use: No   OB History   Grav Para Term Preterm  Abortions TAB SAB Ect Mult Living   1 1        1      Review of Systems  Constitutional: Negative for fever and chills.  Eyes: Negative for visual disturbance.  Respiratory: Negative for cough and shortness of breath.   Cardiovascular: Negative for chest pain, palpitations and leg swelling.  Gastrointestinal: Positive for abdominal pain. Negative for nausea and vomiting.  Endocrine: Negative for polydipsia and polyuria.  Genitourinary: Negative for dysuria, urgency and frequency.  Musculoskeletal: Positive for back pain. Negative for myalgias and arthralgias.  Skin: Negative for rash.  Neurological: Negative for dizziness, weakness and light-headedness.    Allergies  Review of patient's allergies indicates no known allergies.  Home Medications   Current Outpatient Rx  Name  Route  Sig  Dispense  Refill  . aspirin 81 MG tablet   Oral   Take 81 mg by mouth daily.         Marland Kitchen atorvastatin (LIPITOR) 20 MG tablet   Oral   Take 20 mg by mouth at bedtime.          . citalopram (CELEXA) 20 MG tablet   Oral   Take 20 mg by mouth daily.         . cyclobenzaprine (FLEXERIL) 10 MG tablet   Oral   Take 1 tablet (10 mg total) by mouth 3 (three) times daily as needed for muscle spasms.   40 tablet  1   . glucose monitoring kit (FREESTYLE) monitoring kit   Does not apply   1 each by Does not apply route 4 (four) times daily - after meals and at bedtime. 1 month Diabetic Testing Supplies for QAC-QHS accuchecks.   1 each   1   . insulin aspart (NOVOLOG) 100 UNIT/ML injection   Subcutaneous   Inject 0-10 Units into the skin 3 (three) times daily with meals. Before each meal 3 times a day, 140-199 - 2 units, 200-250 - 4 units, 251-299 - 6 units,  300-349 - 8 units,  350 or above 10 units.Insulin PEN if approved, provide syringes and needles if needed.   1 vial   12   . metFORMIN (GLUCOPHAGE) 500 MG tablet   Oral   Take 500 mg by mouth daily with breakfast.         .  naproxen sodium (ANAPROX) 220 MG tablet   Oral   Take 220 mg by mouth 2 (two) times daily as needed. pain         . omeprazole (PRILOSEC) 20 MG capsule   Oral   Take 20 mg by mouth daily.          Marland Kitchen oxyCODONE (OXY IR/ROXICODONE) 5 MG immediate release tablet   Oral   Take 1 tablet (5 mg total) by mouth every 8 (eight) hours as needed. For pain   15 tablet   0   . oxyCODONE (OXY IR/ROXICODONE) 5 MG immediate release tablet   Oral   Take 1 tablet (5 mg total) by mouth every 6 (six) hours as needed.   40 tablet   0   . senna (SENOKOT) 8.6 MG TABS   Oral   Take 2 tablets by mouth daily as needed. As needed for constipation.         . valsartan-hydrochlorothiazide (DIOVAN-HCT) 160-12.5 MG per tablet   Oral   Take 1 tablet by mouth every morning.          . Vitamin D, Ergocalciferol, (DRISDOL) 50000 UNITS CAPS   Oral   Take 50,000 Units by mouth every 7 (seven) days. Monday         . zolpidem (AMBIEN) 10 MG tablet   Oral   Take 10 mg by mouth at bedtime as needed. For sleep          BP 142/83  Pulse 95  Temp(Src) 98.5 F (36.9 C) (Oral)  Resp 19  SpO2 98% Physical Exam  Nursing note and vitals reviewed. Constitutional: She is oriented to person, place, and time. Vital signs are normal. She appears well-developed and well-nourished. No distress.  HENT:  Head: Normocephalic and atraumatic.  Pulmonary/Chest: Effort normal. No respiratory distress.  Abdominal: She exhibits mass (possible mass felt in the lower abdomen). There is tenderness in the right lower quadrant, suprapubic area and left lower quadrant.  Neurological: She is alert and oriented to person, place, and time. She has normal strength. Coordination normal.  Skin: Skin is warm and dry. No rash noted. She is not diaphoretic.  Psychiatric: She has a normal mood and affect. Judgment normal.    ED Course  Procedures (including critical care time) Labs Review Labs Reviewed - No data to  display Imaging Review No results found.  MDM   1. Abdominal  pain, other specified site    72 year old female with severe abdominal pain status post fall. Transferring to the emergency department.    Graylon Good, PA-C 11/30/12 1839

## 2012-11-30 NOTE — ED Notes (Signed)
Abdominal pain for one week, hurts every day.  No nausea, no vomiting, no diarrhea, last bm was yesterday-normal

## 2012-11-30 NOTE — ED Notes (Signed)
Family not willing to wait any longer.  Patient and family went home.

## 2012-12-02 ENCOUNTER — Other Ambulatory Visit: Payer: Self-pay | Admitting: Gastroenterology

## 2012-12-02 DIAGNOSIS — R109 Unspecified abdominal pain: Secondary | ICD-10-CM

## 2012-12-02 LAB — URINE CULTURE

## 2012-12-07 ENCOUNTER — Other Ambulatory Visit: Payer: Medicare Other

## 2012-12-07 ENCOUNTER — Ambulatory Visit: Payer: Medicare Other | Attending: Internal Medicine | Admitting: Internal Medicine

## 2012-12-07 VITALS — BP 122/85 | HR 90 | Temp 98.0°F | Resp 16 | Wt 173.0 lb

## 2012-12-07 DIAGNOSIS — W19XXXA Unspecified fall, initial encounter: Secondary | ICD-10-CM

## 2012-12-07 DIAGNOSIS — E785 Hyperlipidemia, unspecified: Secondary | ICD-10-CM

## 2012-12-07 DIAGNOSIS — Z79899 Other long term (current) drug therapy: Secondary | ICD-10-CM | POA: Insufficient documentation

## 2012-12-07 DIAGNOSIS — I10 Essential (primary) hypertension: Secondary | ICD-10-CM

## 2012-12-07 DIAGNOSIS — R079 Chest pain, unspecified: Secondary | ICD-10-CM | POA: Insufficient documentation

## 2012-12-07 DIAGNOSIS — Z794 Long term (current) use of insulin: Secondary | ICD-10-CM | POA: Insufficient documentation

## 2012-12-07 DIAGNOSIS — R109 Unspecified abdominal pain: Secondary | ICD-10-CM

## 2012-12-07 DIAGNOSIS — E119 Type 2 diabetes mellitus without complications: Secondary | ICD-10-CM

## 2012-12-07 MED ORDER — TRAMADOL HCL 50 MG PO TABS: 50.0000 mg | ORAL_TABLET | Freq: Three times a day (TID) | ORAL | Status: DC | PRN

## 2012-12-07 NOTE — Patient Instructions (Signed)

## 2012-12-07 NOTE — Progress Notes (Signed)
Patient states fell in her apartment last week Since than has been having lower abd pain Saw a gi dr who recommended she have a colonoscopy Her husband stated he did not think that was a good idea because of how Much pain she is in

## 2012-12-07 NOTE — Progress Notes (Signed)
[name], female   DOB: 1940-03-18, 72 y.o.   MRN: [id number]  CC: Followup, fall  HPI: 72 year old female with past medical history of diabetes, hypertension, arthritis who presents to clinic for followup status post fall at home about one week prior to this presentation. Patient reports she fell on her right side and has had pains ever since then. In addition, patient reports she went to urgent care for evaluation at the waiting time was too long and she decided to leave. No reports of difficulty ambulating although patient does walk slow because of pain. She is able to bear weight on both legs.  No Known Allergies Past Medical History  Diagnosis Date  . Constipation   . Pelvic adhesions   . Diabetes mellitus   . Hypertension   . Arthritis     hips/neck   Current Outpatient Prescriptions on File Prior to Visit  Medication Sig Dispense Refill  . aspirin 81 MG tablet Take 81 mg by mouth daily.      Marland Kitchen atorvastatin (LIPITOR) 20 MG tablet Take 20 mg by mouth at bedtime.       . citalopram (CELEXA) 20 MG tablet Take 20 mg by mouth daily.      . cyclobenzaprine (FLEXERIL) 10 MG tablet Take 1 tablet (10 mg total) by mouth 3 (three) times daily as needed for muscle spasms.  40 tablet  1  . glucose monitoring kit (FREESTYLE) monitoring kit 1 each by Does not apply route 4 (four) times daily - after meals and at bedtime. 1 month Diabetic Testing Supplies for QAC-QHS accuchecks.  1 each  1  . insulin aspart (NOVOLOG) 100 UNIT/ML injection Inject 0-10 Units into the skin 3 (three) times daily with meals. Before each meal 3 times a day, 140-199 - 2 units, 200-250 - 4 units, 251-299 - 6 units,  300-349 - 8 units,  350 or above 10 units.Insulin PEN if approved, provide syringes and needles if needed.  1 vial  12  . metFORMIN (GLUCOPHAGE) 500 MG tablet Take 500 mg by mouth daily with breakfast.      . naproxen sodium (ANAPROX) 220 MG tablet Take 220 mg by mouth 2 (two) times daily as  needed. pain      . omeprazole (PRILOSEC) 20 MG capsule Take 20 mg by mouth daily.       Marland Kitchen oxyCODONE (OXY IR/ROXICODONE) 5 MG immediate release tablet Take 1 tablet (5 mg total) by mouth every 8 (eight) hours as needed. For pain  15 tablet  0  . oxyCODONE (OXY IR/ROXICODONE) 5 MG immediate release tablet Take 1 tablet (5 mg total) by mouth every 6 (six) hours as needed.  40 tablet  0  . senna (SENOKOT) 8.6 MG TABS Take 2 tablets by mouth daily as needed. As needed for constipation.      . valsartan-hydrochlorothiazide (DIOVAN-HCT) 160-12.5 MG per tablet Take 1 tablet by mouth every morning.       . Vitamin D, Ergocalciferol, (DRISDOL) 50000 UNITS CAPS Take 50,000 Units by mouth every 7 (seven) days. Monday      . zolpidem (AMBIEN) 10 MG tablet Take 10 mg by mouth at bedtime as needed. For sleep       No current facility-administered medications on file prior to visit.   Family History  Problem Relation Age of Onset  . Cancer Father     bone  . Heart disease Mother    History   Social History  . Marital  Status: Married    Spouse Name: N/A    Number of Children: N/A  . Years of Education: N/A   Occupational History  . Not on file.   Social History Main Topics  . Smoking status: Never Smoker   . Smokeless tobacco: Never Used  . Alcohol Use: No  . Drug Use: No  . Sexual Activity: No   Other Topics Concern  . Not on file   Social History Narrative  . No narrative on file    Review of Systems  Constitutional: Negative for fever, chills, diaphoresis, activity change, appetite change and fatigue.  HENT: Negative for ear pain, nosebleeds, congestion, facial swelling, rhinorrhea, neck pain, neck stiffness and ear discharge.   Eyes: Negative for pain, discharge, redness, itching and visual disturbance.  Respiratory: Negative for cough, choking, chest tightness, shortness of breath, wheezing and stridor.   Cardiovascular: Negative for chest pain, palpitations and leg swelling.   Gastrointestinal: Negative for abdominal distention.  Genitourinary: Negative for dysuria, urgency, frequency, hematuria, flank pain, decreased urine volume, difficulty urinating and dyspareunia.  Musculoskeletal: Positive for right-sided rib cage pain, positive for falls  Neurological: Negative for dizziness, tremors, seizures, syncope, facial asymmetry, speech difficulty, weakness, light-headedness, numbness and headaches.  positive for fall Hematological: Negative for adenopathy. Does not bruise/bleed easily.  Psychiatric/Behavioral: Negative for hallucinations, behavioral problems, confusion, dysphoric mood, decreased concentration and agitation.    Objective:   Filed Vitals:   12/07/12 1412  BP: 122/85  Pulse: 90  Temp: 98 F (36.7 C)  Resp: 16    Physical Exam  Constitutional: Appears well-developed and well-nourished. No distress.  HENT: Normocephalic. External right and left ear normal. Oropharynx is clear and moist.  Eyes: Conjunctivae and EOM are normal. PERRLA, no scleral icterus.  Neck: Normal ROM. Neck supple. No JVD. No tracheal deviation. No thyromegaly.  CVS: RRR, S1/S2 +, no murmurs, no gallops, no carotid bruit.  Pulmonary: Effort and breath sounds normal, no stridor, rhonchi, wheezes, rales.  Abdominal: Soft. BS +,  no distension, tenderness, rebound or guarding.  Musculoskeletal: Normal range of motion. No edema and no tenderness.  Lymphadenopathy: No lymphadenopathy noted, cervical, inguinal. Neuro: Alert. Normal reflexes, muscle tone coordination. No cranial nerve deficit. Skin: Skin is warm and dry. No rash noted. Not diaphoretic. No erythema. No pallor.  Psychiatric: Normal mood and affect. Behavior, judgment, thought content normal.   Lab Results  Component Value Date   WBC 11.6* 11/30/2012   HGB 13.4 11/30/2012   HCT 37.4 11/30/2012   MCV 93.5 11/30/2012   PLT 127* 11/30/2012   Lab Results  Component Value Date   CREATININE 0.75 11/30/2012   BUN 20  11/30/2012   NA 140 11/30/2012   K 3.7 11/30/2012   CL 100 11/30/2012   CO2 29 11/30/2012    Lab Results  Component Value Date   HGBA1C 5.0 08/09/2012   Lipid Panel  No results found for this basename: chol, trig, hdl, cholhdl, vldl, ldlcalc       Assessment and plan:   Patient Active Problem List   Diagnosis Date Noted  . Fall 12/07/2012    Priority: High - Obtain x-ray of the right side of the rib cage  - Prescription provided for pain relief, Ultram 50 mg every 6 hours as needed   . HTN (hypertension) 12/07/2012    Priority: Medium- pressure well controlled, 122/85  - We have discussed target BP range - I have advised pt to check BP regularly and to call us  back if the numbers are higher than 140/90 - discussed the importance of compliance with medical therapy and diet  - Continue current indication for valsartan-HCTZ   . Diabetes mellitus 07/15/2011    Priority: Medium - Continue metformin. A1c in May 2014 was within normal limits. Next A1c due in November 2014   . Hyperlipidemia 07/15/2011    Priority: Medium - Continue statin therapy

## 2012-12-14 ENCOUNTER — Ambulatory Visit
Admission: RE | Admit: 2012-12-14 | Discharge: 2012-12-14 | Disposition: A | Discharge status: Home / Self Care | Payer: Medicare Other | Source: Ambulatory Visit | Attending: Gastroenterology | Admitting: Gastroenterology

## 2012-12-14 DIAGNOSIS — R109 Unspecified abdominal pain: Secondary | ICD-10-CM

## 2012-12-14 MED ORDER — IOHEXOL 300 MG/ML  SOLN
100.0000 mL | Freq: Once | INTRAMUSCULAR | Status: AC | PRN
  Administered 2012-12-14: 100 mL via INTRAVENOUS

## 2012-12-19 ENCOUNTER — Other Ambulatory Visit: Payer: Self-pay | Admitting: Gastroenterology

## 2012-12-20 ENCOUNTER — Encounter (HOSPITAL_COMMUNITY): Payer: Self-pay | Admitting: Pharmacy Technician

## 2012-12-21 ENCOUNTER — Encounter (HOSPITAL_COMMUNITY): Payer: Self-pay | Admitting: *Deleted

## 2013-01-03 ENCOUNTER — Other Ambulatory Visit: Payer: Self-pay | Admitting: Neurosurgery

## 2013-01-03 DIAGNOSIS — M5126 Other intervertebral disc displacement, lumbar region: Secondary | ICD-10-CM

## 2013-01-06 ENCOUNTER — Other Ambulatory Visit: Payer: Medicare Other

## 2013-01-06 ENCOUNTER — Ambulatory Visit
Admission: RE | Admit: 2013-01-06 | Discharge: 2013-01-06 | Disposition: A | Discharge status: Home / Self Care | Payer: Medicare Other | Source: Ambulatory Visit | Attending: Neurosurgery | Admitting: Neurosurgery

## 2013-01-06 DIAGNOSIS — M5126 Other intervertebral disc displacement, lumbar region: Secondary | ICD-10-CM

## 2013-01-06 MED ORDER — GADOBENATE DIMEGLUMINE 529 MG/ML IV SOLN
15.0000 mL | Freq: Once | INTRAVENOUS | Status: AC | PRN
  Administered 2013-01-06: 15 mL via INTRAVENOUS

## 2013-01-10 ENCOUNTER — Ambulatory Visit (HOSPITAL_COMMUNITY): Payer: Medicare Other | Admitting: Anesthesiology

## 2013-01-10 ENCOUNTER — Encounter (HOSPITAL_COMMUNITY): Payer: Medicare Other | Admitting: Anesthesiology

## 2013-01-10 ENCOUNTER — Encounter (HOSPITAL_COMMUNITY): Payer: Self-pay | Admitting: Anesthesiology

## 2013-01-10 ENCOUNTER — Ambulatory Visit (HOSPITAL_COMMUNITY)
Admission: RE | Admit: 2013-01-10 | Discharge: 2013-01-10 | Disposition: A | Discharge status: Home / Self Care | Payer: Medicare Other | Source: Ambulatory Visit | Attending: Gastroenterology | Admitting: Gastroenterology

## 2013-01-10 ENCOUNTER — Encounter (HOSPITAL_COMMUNITY)
Admission: RE | Disposition: A | Discharge status: Home / Self Care | Payer: Self-pay | Source: Ambulatory Visit | Attending: Gastroenterology

## 2013-01-10 DIAGNOSIS — Z90710 Acquired absence of both cervix and uterus: Secondary | ICD-10-CM | POA: Insufficient documentation

## 2013-01-10 DIAGNOSIS — Z1211 Encounter for screening for malignant neoplasm of colon: Secondary | ICD-10-CM | POA: Insufficient documentation

## 2013-01-10 DIAGNOSIS — E119 Type 2 diabetes mellitus without complications: Secondary | ICD-10-CM | POA: Insufficient documentation

## 2013-01-10 DIAGNOSIS — Z860100 Personal history of colon polyps, unspecified: Secondary | ICD-10-CM | POA: Insufficient documentation

## 2013-01-10 DIAGNOSIS — R1084 Generalized abdominal pain: Secondary | ICD-10-CM | POA: Insufficient documentation

## 2013-01-10 DIAGNOSIS — I10 Essential (primary) hypertension: Secondary | ICD-10-CM | POA: Insufficient documentation

## 2013-01-10 DIAGNOSIS — K219 Gastro-esophageal reflux disease without esophagitis: Secondary | ICD-10-CM | POA: Insufficient documentation

## 2013-01-10 HISTORY — PX: COLONOSCOPY WITH PROPOFOL: SHX5780

## 2013-01-10 HISTORY — PX: ESOPHAGOGASTRODUODENOSCOPY (EGD) WITH PROPOFOL: SHX5813

## 2013-01-10 HISTORY — DX: Unspecified dementia, unspecified severity, without behavioral disturbance, psychotic disturbance, mood disturbance, and anxiety: F03.90

## 2013-01-10 LAB — GLUCOSE, CAPILLARY: Glucose-Capillary: 86 mg/dL (ref 70–99)

## 2013-01-10 SURGERY — ESOPHAGOGASTRODUODENOSCOPY (EGD) WITH PROPOFOL
Anesthesia: Monitor Anesthesia Care

## 2013-01-10 MED ORDER — BUTAMBEN-TETRACAINE-BENZOCAINE 2-2-14 % EX AERO
INHALATION_SPRAY | CUTANEOUS | Status: DC | PRN
  Administered 2013-01-10: 1 via TOPICAL

## 2013-01-10 MED ORDER — LACTATED RINGERS IV SOLN
INTRAVENOUS | Status: DC | PRN
  Administered 2013-01-10: 07:00:00 via INTRAVENOUS

## 2013-01-10 MED ORDER — PROPOFOL INFUSION 10 MG/ML OPTIME
INTRAVENOUS | Status: DC | PRN
  Administered 2013-01-10: 160 ug/kg/min via INTRAVENOUS

## 2013-01-10 SURGICAL SUPPLY — 24 items

## 2013-01-10 NOTE — Anesthesia Preprocedure Evaluation (Signed)
Anesthesia Evaluation  Patient identified by MRN, date of birth, ID band Patient awake    Reviewed: Allergy & Precautions, H&P , NPO status , Patient's Chart, lab work & pertinent test results  Airway Mallampati: II TM Distance: >3 FB Neck ROM: Full    Dental no notable dental hx.    Pulmonary neg pulmonary ROS,  breath sounds clear to auscultation  Pulmonary exam normal       Cardiovascular hypertension, Pt. on medications Rhythm:Regular Rate:Normal     Neuro/Psych Dementia  negative psych ROS   GI/Hepatic negative GI ROS, Neg liver ROS,   Endo/Other  diabetes  Renal/GU negative Renal ROS  negative genitourinary   Musculoskeletal negative musculoskeletal ROS (+)   Abdominal   Peds negative pediatric ROS (+)  Hematology negative hematology ROS (+)   Anesthesia Other Findings   Reproductive/Obstetrics negative OB ROS                           Anesthesia Physical Anesthesia Plan  ASA: II  Anesthesia Plan: MAC   Post-op Pain Management:    Induction:   Airway Management Planned:   Additional Equipment:   Intra-op Plan:   Post-operative Plan:   Informed Consent: I have reviewed the patients History and Physical, chart, labs and discussed the procedure including the risks, benefits and alternatives for the proposed anesthesia with the patient or authorized representative who has indicated his/her understanding and acceptance.   Dental advisory given  Plan Discussed with: CRNA  Anesthesia Plan Comments:         Anesthesia Quick Evaluation

## 2013-01-10 NOTE — Op Note (Signed)
Problems: Unexplained abdominal pain. Adenomatous colon polyp removed in 2005.  Endoscopist: Danise Edge  Premedication: Propofol administered by anesthesia  Procedure: Diagnostic esophagogastroduodenoscopy The patient was placed in the left lateral decubitus position. The Pentax gastroscope was passed through the posterior hypopharynx into the proximal esophagus without difficulty. The hypopharynx, larynx, and vocal cords appeared normal.  Esophagoscopy: The proximal, mid, and lower segments of the esophageal mucosa appeared normal. The squamocolumnar junction is noted at 36 cm from the incisor teeth.  Gastroscopy: Retroflex view of the gastric cardia and fundus was normal. The gastric body, antrum, and pylorus appeared normal.  Duodenoscopy: The duodenal bulb and descending duodenum appeared normal  Assessment: Normal esophagogastroduodenoscopy.  Procedure: Surveillance colonoscopy Anal inspection and digital rectal exam were normal. The Pentax pediatric colonoscope was introduced into the rectum and easily advanced to the cecum. A normal-appearing ileocecal valve and appendiceal orifice were identified. Colonic preparation for the exam today was good.  Rectum. Normal. Retroflex view of the distal rectum normal  Sigmoid colon and descending colon. Normal  Splenic flexure. Normal  Transverse colon. Normal  Hepatic flexure. Normal  Ascending colon. Normal  Cecum and ileocecal valve. Normal  Assessment: Normal surveillance proctocolonoscopy to the cecum  Recommendations: Schedule repeat surveillance colonoscopy in 5 years if the patient's health allows

## 2013-01-10 NOTE — Transfer of Care (Signed)
Immediate Anesthesia Transfer of Care Note  Patient: Valerie Sullivan  Procedure(s) Performed: Procedure(s): ESOPHAGOGASTRODUODENOSCOPY (EGD) WITH PROPOFOL (N/A) COLONOSCOPY WITH PROPOFOL (N/A)  Patient Location: PACU  Anesthesia Type:MAC  Level of Consciousness: awake and oriented  Airway & Oxygen Therapy: Patient connected to nasal cannula oxygen  Post-op Assessment: Report given to PACU RN and Post -op Vital signs reviewed and stable  Post vital signs: Reviewed and stable  Complications: No apparent anesthesia complications

## 2013-01-10 NOTE — Anesthesia Postprocedure Evaluation (Signed)
  Anesthesia Post-op Note  Patient: Valerie Sullivan  Procedure(s) Performed: Procedure(s) (LRB): ESOPHAGOGASTRODUODENOSCOPY (EGD) WITH PROPOFOL (N/A) COLONOSCOPY WITH PROPOFOL (N/A)  Patient Location: PACU  Anesthesia Type: MAC  Level of Consciousness: awake and alert   Airway and Oxygen Therapy: Patient Spontanous Breathing  Post-op Pain: mild  Post-op Assessment: Post-op Vital signs reviewed, Patient's Cardiovascular Status Stable, Respiratory Function Stable, Patent Airway and No signs of Nausea or vomiting  Last Vitals:  Filed Vitals:   01/10/13 1010  BP: 153/97  Pulse:   Temp:   Resp: 21    Post-op Vital Signs: stable   Complications: No apparent anesthesia complications

## 2013-01-10 NOTE — H&P (Signed)
  Problem: Unexplained abdominal pain. History of adenomatous colon polyp.  History: The patient is a 72 year old female born February 19, 1941. The patient has unexplained attacks of sharp, generalized abdominal pain that doubled her over. CT scan of the abdomen and pelvis showed a stable lesion in the right kidney since August 2013. This lesion has been monitored by her urologist.  In November 2005, the patient underwent a colonoscopy with removal of a cecal tubulovillous adenoma.  The patient is scheduled to undergo a diagnostic esophagogastroduodenoscopy and colonoscopy.  Medication allergies: None  Past medical history: Type 2 diabetes mellitus.  Hypertension. Insomnia. Depression. Gastroesophageal reflux. Total abdominal hysterectomy for uterine fibroids. Back surgery.  Exam: The patient is alert and lying comfortably on the endoscopy stretcher. Abdomen is soft and nontender to palpation. Lungs are clear to auscultation. Cardiac exam reveals a regular rhythm.  Plan: Proceed diagnostic esophagogastroduodenoscopy and colonoscopy.

## 2013-01-11 ENCOUNTER — Encounter (HOSPITAL_COMMUNITY): Payer: Self-pay | Admitting: Gastroenterology

## 2013-02-15 ENCOUNTER — Encounter (HOSPITAL_COMMUNITY): Payer: Self-pay | Admitting: Emergency Medicine

## 2013-02-15 ENCOUNTER — Emergency Department (HOSPITAL_COMMUNITY)
Admission: EM | Admit: 2013-02-15 | Discharge: 2013-02-15 | Disposition: A | Discharge status: Home / Self Care | Payer: Medicare Other | Attending: Emergency Medicine | Admitting: Emergency Medicine

## 2013-02-15 ENCOUNTER — Other Ambulatory Visit (HOSPITAL_COMMUNITY)
Admission: RE | Admit: 2013-02-15 | Discharge: 2013-02-15 | Disposition: A | Discharge status: Home / Self Care | Payer: Medicare Other | Source: Ambulatory Visit | Attending: Emergency Medicine | Admitting: Emergency Medicine

## 2013-02-15 ENCOUNTER — Emergency Department (INDEPENDENT_AMBULATORY_CARE_PROVIDER_SITE_OTHER)
Admission: EM | Admit: 2013-02-15 | Discharge: 2013-02-15 | Disposition: A | Discharge status: Other Acute Inpatient Hospital | Payer: Medicare Other | Source: Home / Self Care | Attending: Emergency Medicine | Admitting: Emergency Medicine

## 2013-02-15 ENCOUNTER — Emergency Department (HOSPITAL_COMMUNITY): Payer: Medicare Other

## 2013-02-15 DIAGNOSIS — M1610 Unilateral primary osteoarthritis, unspecified hip: Secondary | ICD-10-CM | POA: Insufficient documentation

## 2013-02-15 DIAGNOSIS — Z79899 Other long term (current) drug therapy: Secondary | ICD-10-CM | POA: Insufficient documentation

## 2013-02-15 DIAGNOSIS — R1032 Left lower quadrant pain: Secondary | ICD-10-CM

## 2013-02-15 DIAGNOSIS — Z9889 Other specified postprocedural states: Secondary | ICD-10-CM | POA: Insufficient documentation

## 2013-02-15 DIAGNOSIS — Z7982 Long term (current) use of aspirin: Secondary | ICD-10-CM | POA: Insufficient documentation

## 2013-02-15 DIAGNOSIS — R5381 Other malaise: Secondary | ICD-10-CM | POA: Insufficient documentation

## 2013-02-15 DIAGNOSIS — Z90710 Acquired absence of both cervix and uterus: Secondary | ICD-10-CM | POA: Insufficient documentation

## 2013-02-15 DIAGNOSIS — N760 Acute vaginitis: Secondary | ICD-10-CM | POA: Insufficient documentation

## 2013-02-15 DIAGNOSIS — E119 Type 2 diabetes mellitus without complications: Secondary | ICD-10-CM | POA: Insufficient documentation

## 2013-02-15 DIAGNOSIS — R109 Unspecified abdominal pain: Secondary | ICD-10-CM

## 2013-02-15 DIAGNOSIS — Z794 Long term (current) use of insulin: Secondary | ICD-10-CM | POA: Insufficient documentation

## 2013-02-15 DIAGNOSIS — Z8742 Personal history of other diseases of the female genital tract: Secondary | ICD-10-CM | POA: Insufficient documentation

## 2013-02-15 DIAGNOSIS — Z113 Encounter for screening for infections with a predominantly sexual mode of transmission: Secondary | ICD-10-CM | POA: Insufficient documentation

## 2013-02-15 DIAGNOSIS — M47812 Spondylosis without myelopathy or radiculopathy, cervical region: Secondary | ICD-10-CM | POA: Insufficient documentation

## 2013-02-15 DIAGNOSIS — I10 Essential (primary) hypertension: Secondary | ICD-10-CM | POA: Insufficient documentation

## 2013-02-15 DIAGNOSIS — K5900 Constipation, unspecified: Secondary | ICD-10-CM | POA: Insufficient documentation

## 2013-02-15 LAB — CBC WITH DIFFERENTIAL/PLATELET
Basophils Absolute: 0 10*3/uL (ref 0.0–0.1)
Basophils Relative: 0 % (ref 0–1)
Eosinophils Absolute: 0.2 10*3/uL (ref 0.0–0.7)
Hemoglobin: 13.6 g/dL (ref 12.0–15.0)
Lymphocytes Relative: 37 % (ref 12–46)
MCH: 32.7 pg (ref 26.0–34.0)
MCHC: 34.3 g/dL (ref 30.0–36.0)
Monocytes Relative: 6 % (ref 3–12)
Neutro Abs: 5.7 10*3/uL (ref 1.7–7.7)
Neutrophils Relative %: 56 % (ref 43–77)
Platelets: 147 10*3/uL — ABNORMAL LOW (ref 150–400)
RDW: 12.3 % (ref 11.5–15.5)
WBC: 10.3 10*3/uL (ref 4.0–10.5)

## 2013-02-15 LAB — COMPREHENSIVE METABOLIC PANEL
ALT: 20 U/L (ref 0–35)
AST: 22 U/L (ref 0–37)
Albumin: 4.3 g/dL (ref 3.5–5.2)
Alkaline Phosphatase: 101 U/L (ref 39–117)
Chloride: 103 mEq/L (ref 96–112)
Potassium: 3.9 mEq/L (ref 3.5–5.1)
Sodium: 142 mEq/L (ref 135–145)
Total Bilirubin: 0.4 mg/dL (ref 0.3–1.2)

## 2013-02-15 LAB — URINALYSIS, ROUTINE W REFLEX MICROSCOPIC
Glucose, UA: 100 mg/dL — AB
Hgb urine dipstick: NEGATIVE
Ketones, ur: 15 mg/dL — AB
Urobilinogen, UA: 1 mg/dL (ref 0.0–1.0)
pH: 5.5 (ref 5.0–8.0)

## 2013-02-15 LAB — URINE MICROSCOPIC-ADD ON

## 2013-02-15 MED ORDER — MORPHINE SULFATE 4 MG/ML IJ SOLN
4.0000 mg | Freq: Once | INTRAMUSCULAR | Status: AC
  Administered 2013-02-15: 4 mg via INTRAVENOUS
  Filled 2013-02-15: qty 1

## 2013-02-15 MED ORDER — IOHEXOL 300 MG/ML  SOLN
100.0000 mL | Freq: Once | INTRAMUSCULAR | Status: AC | PRN
  Administered 2013-02-15: 100 mL via INTRAVENOUS

## 2013-02-15 MED ORDER — DIPHENHYDRAMINE HCL 50 MG/ML IJ SOLN
25.0000 mg | Freq: Once | INTRAMUSCULAR | Status: AC
  Administered 2013-02-15: 25 mg via INTRAVENOUS
  Filled 2013-02-15: qty 1

## 2013-02-15 MED ORDER — IOHEXOL 300 MG/ML  SOLN
25.0000 mL | INTRAMUSCULAR | Status: AC
  Administered 2013-02-15: 25 mL via ORAL

## 2013-02-15 MED ORDER — SODIUM CHLORIDE 0.9 % IV BOLUS (SEPSIS)
1000.0000 mL | Freq: Once | INTRAVENOUS | Status: AC
  Administered 2013-02-15: 1000 mL via INTRAVENOUS

## 2013-02-15 MED ORDER — POLYETHYLENE GLYCOL 3350 17 G PO PACK: 17.0000 g | PACK | Freq: Every day | ORAL | Status: DC

## 2013-02-15 MED ORDER — ONDANSETRON HCL 4 MG/2ML IJ SOLN
4.0000 mg | Freq: Once | INTRAMUSCULAR | Status: AC
  Administered 2013-02-15: 4 mg via INTRAVENOUS
  Filled 2013-02-15: qty 2

## 2013-02-15 NOTE — ED Notes (Signed)
Pt transported to CT ?

## 2013-02-15 NOTE — ED Notes (Signed)
Pt  Reports  Symptoms  Of       Low  abd  Pain  With  Swelling  Of  Lower extremitys  As  Well  As  A  Vaginal  Discharge        She  Is  Using    Flagyl  Cream               She  Has  A  History  Of diabetes   And  Hypertension     Her  Caregiver is  With  Her

## 2013-02-15 NOTE — ED Provider Notes (Signed)
CSN: [id number]     Arrival date & time 02/15/13  1515 History   First MD Initiated Contact with Patient 02/15/13 1543     Chief Complaint  Patient presents with  . Abdominal Pain   (Consider location/radiation/quality/duration/timing/severity/associated sxs/prior Treatment) HPI Comments: Patient is a 72 year old female history of the diabetes, hypertension, dementia, pelvic adhesions who presents today with abdominal pain. She has been having gradually worsening left lower quadrant pain. This is a sharp, crampy pain which does not radiate. She gives a history of getting prescribed a vaginal suppository, but has not used it because she does not want her colon to "blow up". She is unable to tell me why she was prescribed a vaginal suppository. She was seen at urgent care. The physician she saw there was concern for possible diverticulitis and recommended CT scan. A pelvic exam she received at urgent care was unremarkable. She denies fever, chills, nausea, vomiting, diarrhea, constipation, melena, hematochezia, urinary urgency, urinary frequency, dysuria. Her last bowel movement was yesterday and was normal for her.    Patient is a 72 y.o. female presenting with abdominal pain. The history is provided by the patient. No language interpreter was used.  Abdominal Pain Associated symptoms: fatigue   Associated symptoms: no chills, no diarrhea, no fever, no nausea, no shortness of breath and no vomiting     Past Medical History  Diagnosis Date  . Constipation   . Pelvic adhesions   . Diabetes mellitus   . Hypertension   . Arthritis     hips/neck  . Dementia     per spouse   Past Surgical History  Procedure Laterality Date  . Partial hysterectomy    . Uterine fibroid surgery  1992    tumor removed  . Lumbar laminectomy/decompression microdiscectomy Right 09/23/2012    Procedure: LUMBAR LAMINECTOMY/DECOMPRESSION MICRODISCECTOMY 1 LEVEL;  Surgeon: Mariam Dollar, MD;  Location: [facility];   Service: Neurosurgery;  Laterality: Right;  right lumbar five-sacral one  . Abdominal hysterectomy    . Back surgery    . Esophagogastroduodenoscopy (egd) with propofol N/A 01/10/2013    Procedure: ESOPHAGOGASTRODUODENOSCOPY (EGD) WITH PROPOFOL;  Surgeon: Charolett Bumpers, MD;  Location: [facility];  Service: Endoscopy;  Laterality: N/A;  . Colonoscopy with propofol N/A 01/10/2013    Procedure: COLONOSCOPY WITH PROPOFOL;  Surgeon: Charolett Bumpers, MD;  Location: [facility];  Service: Endoscopy;  Laterality: N/A;   Family History  Problem Relation Age of Onset  . Cancer Father     bone  . Heart disease Mother    History  Substance Use Topics  . Smoking status: Never Smoker   . Smokeless tobacco: Never Used  . Alcohol Use: No   OB History   Grav Para Term Preterm Abortions TAB SAB Ect Mult Living   1 1        1      Review of Systems  Constitutional: Positive for fatigue. Negative for fever, chills, diaphoresis and appetite change.  Respiratory: Negative for shortness of breath.   Gastrointestinal: Positive for abdominal pain. Negative for nausea, vomiting, diarrhea and abdominal distention.  Neurological: Positive for weakness (generalized).  All other systems reviewed and are negative.    Allergies  Review of patient's allergies indicates no known allergies.  Home Medications   Current Outpatient Rx  Name  Route  Sig  Dispense  Refill  . aspirin 81 MG tablet   Oral   Take 81 mg by mouth daily.         Marland Kitchen  atorvastatin (LIPITOR) 20 MG tablet   Oral   Take 20 mg by mouth at bedtime.          . citalopram (CELEXA) 20 MG tablet   Oral   Take 20 mg by mouth every morning.          . cyclobenzaprine (FLEXERIL) 10 MG tablet   Oral   Take 1 tablet (10 mg total) by mouth 3 (three) times daily as needed for muscle spasms.   40 tablet   1   . insulin aspart (NOVOLOG) 100 UNIT/ML injection   Subcutaneous   Inject 0-10 Units into the skin 3 (three) times daily  with meals. Before each meal 3 times a day, 140-199 - 2 units, 200-250 - 4 units, 251-299 - 6 units,  300-349 - 8 units,  350 or above 10 units.Insulin PEN if approved, provide syringes and needles if needed.   1 vial   12   . metFORMIN (GLUCOPHAGE) 500 MG tablet   Oral   Take 500 mg by mouth daily with breakfast.         . naproxen sodium (ANAPROX) 220 MG tablet   Oral   Take 220 mg by mouth 2 (two) times daily as needed. pain         . omeprazole (PRILOSEC) 20 MG capsule   Oral   Take 20 mg by mouth daily.          Marland Kitchen oxyCODONE (OXY IR/ROXICODONE) 5 MG immediate release tablet   Oral   Take 1 tablet (5 mg total) by mouth every 8 (eight) hours as needed. For pain   15 tablet   0   . oxyCODONE (OXY IR/ROXICODONE) 5 MG immediate release tablet   Oral   Take 1 tablet (5 mg total) by mouth every 6 (six) hours as needed.   40 tablet   0   . senna (SENOKOT) 8.6 MG TABS   Oral   Take 2 tablets by mouth daily as needed. As needed for constipation.         . traMADol (ULTRAM) 50 MG tablet   Oral   Take 50 mg by mouth every 8 (eight) hours as needed for pain. Have not had it filled         . traZODone (DESYREL) 100 MG tablet   Oral   Take 100 mg by mouth at bedtime.         . valsartan-hydrochlorothiazide (DIOVAN-HCT) 160-12.5 MG per tablet   Oral   Take 1 tablet by mouth every morning.          . Vitamin D, Ergocalciferol, (DRISDOL) 50000 UNITS CAPS   Oral   Take 50,000 Units by mouth every 7 (seven) days. Monday         . zolpidem (AMBIEN) 10 MG tablet   Oral   Take 10 mg by mouth at bedtime as needed. For sleep          BP 157/94  Pulse 89  Temp(Src) 98.4 F (36.9 C) (Oral)  Resp 14  Wt 157 lb (71.215 kg)  SpO2 100% Physical Exam  Nursing note and vitals reviewed. Constitutional: She is oriented to person, place, and time. She appears well-developed and well-nourished. No distress.  HENT:  Head: Normocephalic and atraumatic.  Right Ear:  External ear normal.  Left Ear: External ear normal.  Nose: Nose normal.  Mouth/Throat: Oropharynx is clear and moist.  Eyes: Conjunctivae are normal.  Neck: Normal range of motion.  Cardiovascular:  Normal rate, regular rhythm and normal heart sounds.   Pulmonary/Chest: Effort normal and breath sounds normal. No stridor. No respiratory distress. She has no wheezes. She has no rales.  Abdominal: Soft. Bowel sounds are normal. She exhibits no distension. There is tenderness in the left lower quadrant. There is no rigidity, no rebound and no guarding.    Musculoskeletal: Normal range of motion.  Neurological: She is alert and oriented to person, place, and time. She has normal strength.  Skin: Skin is warm and dry. She is not diaphoretic. No erythema.  Psychiatric: She has a normal mood and affect. Her behavior is normal.    ED Course  Procedures (including critical care time) Labs Review Labs Reviewed  CBC WITH DIFFERENTIAL - Abnormal; Notable for the following:    Platelets 147 (*)    All other components within normal limits  COMPREHENSIVE METABOLIC PANEL - Abnormal; Notable for the following:    Glucose, Bld 102 (*)    GFR calc non Af Amer 85 (*)    All other components within normal limits  URINALYSIS, ROUTINE W REFLEX MICROSCOPIC - Abnormal; Notable for the following:    Color, Urine AMBER (*)    APPearance HAZY (*)    Specific Gravity, Urine 1.031 (*)    Glucose, UA 100 (*)    Bilirubin Urine SMALL (*)    Ketones, ur 15 (*)    Leukocytes, UA TRACE (*)    All other components within normal limits  URINE MICROSCOPIC-ADD ON - Abnormal; Notable for the following:    Squamous Epithelial / LPF FEW (*)    Crystals CA OXALATE CRYSTALS (*)    All other components within normal limits  LIPASE, BLOOD   Imaging Review Ct Abdomen Pelvis W Contrast  02/15/2013   CLINICAL DATA:  Left lower quadrant pain  EXAM: CT ABDOMEN AND PELVIS WITH CONTRAST  TECHNIQUE: Multidetector CT imaging  of the abdomen and pelvis was performed using the standard protocol following bolus administration of intravenous contrast.  CONTRAST:  OMNIPAQUE IOHEXOL 300 MG/ML  SOLN  COMPARISON:  12/14/2012  FINDINGS: Sagittal images of the spine shows stable degenerative changes lumbar spine. Lung bases are unremarkable. Enhanced liver is unremarkable. Gallbladder is contracted without evidence of calcified gallstones.  The pancreas, spleen and adrenal glands are unremarkable. Kidneys are symmetrical in enhancement. Enhancing lesion in lower pole of the right kidney medial aspect measures 1.5 x 1.7 cm stable in size in appearance from prior exam.  No aortic aneurysm.  Moderate stool in right colon. No ascites or free air. No adenopathy. No pericecal inflammation. Normal appendix.  Mild distended urinary bladder. The patient is status post hysterectomy. No destructive bony lesions are noted within pelvis.  No hydronephrosis or hydroureter. Delayed renal images shows bilateral renal symmetrical excretion. Bilateral visualized proximal ureter is unremarkable.  IMPRESSION: 1. Stable enhancing lesion in lower pole of the right kidney. No hydronephrosis or hydroureter. 2. No small bowel obstruction. 3. Normal appendix. No pericecal inflammation. Moderate stool noted in right colon. 4. Status post hysterectomy.   Electronically Signed   By: Natasha Mead M.D.   On: 02/15/2013 20:55    EKG Interpretation   None       MDM   1. Abdominal pain    Patient is nontoxic, nonseptic appearing, in no apparent distress.  Patient's pain and other symptoms adequately managed in emergency department.  Fluid bolus given.  Labs, imaging and vitals reviewed.  Patient does not meet the SIRS or  Sepsis criteria.  On repeat exam patient does not have a surgical abdomen and there are no peritoneal signs.  No indication of appendicitis, bowel obstruction, bowel perforation, cholecystitis, diverticulitis.  Patient discharged home with  symptomatic treatment and given strict instructions for follow-up with the gastroenterologist on call.  I have also discussed reasons to return immediately to the ER.  Patient expresses understanding and agrees with plan. Dr. Rubin Payor evaluated patient and agrees with plan. Patient / Family / Caregiver informed of clinical course, understand medical decision-making process, and agree with plan.        Mora Bellman, PA-C 02/15/13 2359

## 2013-02-15 NOTE — ED Notes (Signed)
Pt from [facility] with LLQ pain and tenderness x 1 week.  Denies fever, nausea, vomiting, diarrhea, or urinary complaints.

## 2013-02-15 NOTE — ED Provider Notes (Signed)
Chief Complaint:   Chief Complaint  Patient presents with  . Abdominal Pain    History of Present Illness:    Valerie Sullivan is a 72 year old female with dementia who comes in today with her home health care worker. She presents with a one-week history of left lower quadrant pain which seems to be getting worse and worse. She denies any fever, chills, nausea, or vomiting. Her appetite is good and she does not think she has lost any weight. Her bowel movements have been regular and she denies any diarrhea, constipation, or blood in the stool. She has not had any urinary symptoms. She has had a malodorous vaginal discharge. She is status post hysterectomy and has a history of fibroid tumors. She had a similar incident about 3 months ago. She was hospitalized for this, had an abdominal CT, and colonoscopy and upper endoscopy all of which were normal. As far she can recall this is a similar pain. She has no history of diverticulosis or diverticulitis.  Review of Systems:  Other than noted above, the patient denies any of the following symptoms: Constitutional:  No fever, chills, fatigue, weight loss or anorexia. Lungs:  No cough or shortness of breath. Heart:  No chest pain, palpitations, syncope or edema.  No cardiac history. Abdomen:  No nausea, vomiting, hematememesis, melena, diarrhea, or hematochezia. GU:  No dysuria, frequency, urgency, or hematuria. Gyn:  No vaginal discharge, itching, abnormal bleeding, dyspareunia, or pelvic pain.  PMFSH:  Past medical history, family history, social history, meds, and allergies were reviewed along with nurse's notes.  No prior abdominal surgeries, past history of GI problems, STDs or GYN problems.  No history of aspirin or NSAID use.  No excessive alcohol intake. She has no known medication allergies. She takes aspirin, Lipitor, Celexa, Flexeril, NovoLog insulin, metformin, Anaprox, Prilosec, oxycodone, Senokot, tramadol, Vistaril, Diovan/HCTZ, vitamin D, and  Ambien. She has a history of constipation, pelvic adhesions, diabetes, hypertension, arthritis, and dementia.  Physical Exam:   Vital signs:  BP 175/102  Pulse 90  Temp(Src) 98.1 F (36.7 C) (Oral)  Resp 24  SpO2 95% Gen:  Alert, oriented, she is moaning and crying in distress. Lungs:  Breath sounds clear and equal bilaterally.  No wheezes, rales or rhonchi. Heart:  Regular rhythm.  No gallops or murmers.   Abdomen:  Soft, flat, nondistended. No organomegaly or mass. Bowel sounds are normally active. There is tenderness over the entire abdomen, but most markedly in the left lower quadrant with guarding. No rebound. No mass was felt. Pelvic:  Normal external genitalia. Vaginal mucosa was atrophic. Cervix and uterus were surgically absent. There is no vaginal discharge and only minimal odor. Bimanual examination reveals uterus to be absent, there are no adnexal masses or tenderness. DNA probe for gonorrhea, Chlamydia, Trichomonas, Gardnerella, Candida were obtained. Skin:  Clear, warm and dry.  No rash.  Course in Urgent Care Center:   We attempted but were unable to obtain a urine specimen.  Assessment:  There were no encounter diagnoses.  Differential diagnosis includes diverticulitis or adhesions. She will need a CT scan.  Plan:  The patient was transferred to the ED via shuttle in stable condition.  Medical Decision Making:  72 year old female with dementia complains of a 1 week history of gradually increasing LLQ pain.  No fever, nausea, vomiting, urinary symptoms, blood in stool, or diarrhea.  She has had a malodorous vaginal discharge.  No vaginal bleeding.  She was hospitalized 2 months ago  with similar symptoms and underwent an abdominal CT and upper and lower endoscopy which were negative.  She appears in distress and is tender in LLQ with guarding.  Her pelvic exam is normal.  DNA probes have been obtained.  I am concerned about diverticulitis.  We were unable to obtain a urine  specimen.      Reuben Likes, MD 02/15/13 [phone]

## 2013-02-15 NOTE — ED Notes (Signed)
Pt c/o LLQ pain X 1 week. Denies n/v/d/urinary symptoms. Nad, skin warm and dry, resp e/u.

## 2013-02-16 NOTE — ED Provider Notes (Signed)
Medical screening examination/treatment/procedure(s) were performed by non-physician practitioner and as supervising physician I was immediately available for consultation/collaboration.  EKG Interpretation   None        Juliet Rude. Rubin Payor, MD 02/16/13 [facility]

## 2013-02-17 ENCOUNTER — Telehealth (HOSPITAL_COMMUNITY): Payer: Self-pay | Admitting: Emergency Medicine

## 2013-02-17 MED ORDER — METRONIDAZOLE 500 MG PO TABS: 500.0000 mg | ORAL_TABLET | Freq: Two times a day (BID) | ORAL | Status: DC

## 2013-02-17 NOTE — ED Notes (Signed)
Her DNA probe came back positive for Gardnerella. She was sent to the hospital but was not discharged home on any antibiotics. We'll need to treat with Flagyl 500 mg twice a day for one week.  Reuben Likes, MD 02/17/13 2112

## 2013-02-18 ENCOUNTER — Telehealth (HOSPITAL_COMMUNITY): Payer: Self-pay | Admitting: *Deleted

## 2013-02-18 NOTE — ED Notes (Addendum)
GC/Chlamydia neg., Affirm: Candida and Trich neg., Gardnerella pos. 12/4 Message sent to Dr. Lorenz Coaster.  12/6  Dr. Lorenz Coaster e-prescribed Flagyl to [facility] on [address].  I called pt. but not answer.  Unable to leave a message. Cherly Anderson M 02/18/2013 No answer at pt.'s phone-no answering machine. I called contact Marinell Blight and left a message for pt. to call.  At first he said he did not know her, but then said he had a nephew Zimal Weisensel. He said his wife's name is [employer]. I said this might be her since her middle initial is N. I said she put you as her contact.  I explained that this was my second call and unable to leave message on her phone.  He said he would try and contact her. 02/20/2013

## 2013-03-04 ENCOUNTER — Emergency Department (HOSPITAL_COMMUNITY): Payer: Medicare Other

## 2013-03-04 ENCOUNTER — Inpatient Hospital Stay (HOSPITAL_COMMUNITY)
Admission: EM | Admit: 2013-03-04 | Discharge: 2013-03-08 | DRG: 690 | Disposition: A | Discharge status: Skilled Nursing Facility | Payer: Medicare Other | Attending: Internal Medicine | Admitting: Internal Medicine

## 2013-03-04 ENCOUNTER — Encounter (HOSPITAL_COMMUNITY): Payer: Self-pay | Admitting: Emergency Medicine

## 2013-03-04 DIAGNOSIS — Z794 Long term (current) use of insulin: Secondary | ICD-10-CM

## 2013-03-04 DIAGNOSIS — N2889 Other specified disorders of kidney and ureter: Secondary | ICD-10-CM | POA: Diagnosis present

## 2013-03-04 DIAGNOSIS — G20 Parkinson's disease: Secondary | ICD-10-CM | POA: Diagnosis present

## 2013-03-04 DIAGNOSIS — R29898 Other symptoms and signs involving the musculoskeletal system: Secondary | ICD-10-CM | POA: Diagnosis present

## 2013-03-04 DIAGNOSIS — Z7982 Long term (current) use of aspirin: Secondary | ICD-10-CM

## 2013-03-04 DIAGNOSIS — Z9181 History of falling: Secondary | ICD-10-CM

## 2013-03-04 DIAGNOSIS — N736 Female pelvic peritoneal adhesions (postinfective): Secondary | ICD-10-CM

## 2013-03-04 DIAGNOSIS — M47817 Spondylosis without myelopathy or radiculopathy, lumbosacral region: Secondary | ICD-10-CM | POA: Diagnosis present

## 2013-03-04 DIAGNOSIS — Z90710 Acquired absence of both cervix and uterus: Secondary | ICD-10-CM

## 2013-03-04 DIAGNOSIS — N390 Urinary tract infection, site not specified: Principal | ICD-10-CM | POA: Diagnosis present

## 2013-03-04 DIAGNOSIS — R4182 Altered mental status, unspecified: Secondary | ICD-10-CM | POA: Diagnosis present

## 2013-03-04 DIAGNOSIS — Z87440 Personal history of urinary (tract) infections: Secondary | ICD-10-CM

## 2013-03-04 DIAGNOSIS — R5381 Other malaise: Secondary | ICD-10-CM | POA: Diagnosis present

## 2013-03-04 DIAGNOSIS — R270 Ataxia, unspecified: Secondary | ICD-10-CM

## 2013-03-04 DIAGNOSIS — Z8249 Family history of ischemic heart disease and other diseases of the circulatory system: Secondary | ICD-10-CM

## 2013-03-04 DIAGNOSIS — E785 Hyperlipidemia, unspecified: Secondary | ICD-10-CM

## 2013-03-04 DIAGNOSIS — R531 Weakness: Secondary | ICD-10-CM

## 2013-03-04 DIAGNOSIS — M1990 Unspecified osteoarthritis, unspecified site: Secondary | ICD-10-CM

## 2013-03-04 DIAGNOSIS — Z79899 Other long term (current) drug therapy: Secondary | ICD-10-CM

## 2013-03-04 DIAGNOSIS — R269 Unspecified abnormalities of gait and mobility: Secondary | ICD-10-CM | POA: Diagnosis present

## 2013-03-04 DIAGNOSIS — F0390 Unspecified dementia without behavioral disturbance: Secondary | ICD-10-CM | POA: Diagnosis present

## 2013-03-04 DIAGNOSIS — G20A1 Parkinson's disease without dyskinesia, without mention of fluctuations: Secondary | ICD-10-CM | POA: Diagnosis present

## 2013-03-04 DIAGNOSIS — N289 Disorder of kidney and ureter, unspecified: Secondary | ICD-10-CM | POA: Diagnosis present

## 2013-03-04 DIAGNOSIS — I10 Essential (primary) hypertension: Secondary | ICD-10-CM | POA: Diagnosis present

## 2013-03-04 DIAGNOSIS — W19XXXA Unspecified fall, initial encounter: Secondary | ICD-10-CM

## 2013-03-04 DIAGNOSIS — W19XXXS Unspecified fall, sequela: Secondary | ICD-10-CM

## 2013-03-04 DIAGNOSIS — E119 Type 2 diabetes mellitus without complications: Secondary | ICD-10-CM | POA: Diagnosis present

## 2013-03-04 LAB — CBC WITH DIFFERENTIAL/PLATELET
Basophils Absolute: 0 10*3/uL (ref 0.0–0.1)
Basophils Relative: 0 % (ref 0–1)
Eosinophils Absolute: 0.1 10*3/uL (ref 0.0–0.7)
Eosinophils Relative: 1 % (ref 0–5)
Lymphs Abs: 1.6 10*3/uL (ref 0.7–4.0)
MCH: 32.7 pg (ref 26.0–34.0)
MCHC: 34.5 g/dL (ref 30.0–36.0)
MCV: 94.8 fL (ref 78.0–100.0)
Monocytes Relative: 10 % (ref 3–12)
Neutrophils Relative %: 67 % (ref 43–77)
Platelets: 123 10*3/uL — ABNORMAL LOW (ref 150–400)
RBC: 3.46 MIL/uL — ABNORMAL LOW (ref 3.87–5.11)

## 2013-03-04 LAB — URINALYSIS, ROUTINE W REFLEX MICROSCOPIC
Nitrite: POSITIVE — AB
Protein, ur: NEGATIVE mg/dL
Specific Gravity, Urine: 1.019 (ref 1.005–1.030)
pH: 5.5 (ref 5.0–8.0)

## 2013-03-04 LAB — COMPREHENSIVE METABOLIC PANEL
Albumin: 3.1 g/dL — ABNORMAL LOW (ref 3.5–5.2)
BUN: 12 mg/dL (ref 6–23)
Calcium: 9.1 mg/dL (ref 8.4–10.5)
Chloride: 107 mEq/L (ref 96–112)
GFR calc Af Amer: 70 mL/min — ABNORMAL LOW (ref >90)
Glucose, Bld: 136 mg/dL — ABNORMAL HIGH (ref 70–99)
Potassium: 3.6 mEq/L (ref 3.5–5.1)
Total Bilirubin: 0.3 mg/dL (ref 0.3–1.2)
Total Protein: 6 g/dL (ref 6.0–8.3)

## 2013-03-04 LAB — GLUCOSE, CAPILLARY: Glucose-Capillary: 135 mg/dL — ABNORMAL HIGH (ref 70–99)

## 2013-03-04 LAB — URINE MICROSCOPIC-ADD ON

## 2013-03-04 MED ORDER — DEXTROSE 5 % IV SOLN
1.0000 g | Freq: Once | INTRAVENOUS | Status: AC
  Administered 2013-03-04: 1 g via INTRAVENOUS
  Filled 2013-03-04: qty 10

## 2013-03-04 MED ORDER — SODIUM CHLORIDE 0.9 % IV BOLUS (SEPSIS)
1000.0000 mL | Freq: Once | INTRAVENOUS | Status: AC
  Administered 2013-03-04: 1000 mL via INTRAVENOUS

## 2013-03-04 NOTE — ED Notes (Signed)
Per EMS, Pt coming from home. Pt fell this morning, crawled to living room.Ptar arrived on scene and pt. Was c/o pain in R hip to R femur. Pt. Given 50 mcg fentanyl. Pt is confused. Unsure what baseline is. Can answer some questions appropriately. Pt sts she has had some falls recently and that the last 3 days her equilibrium has been off. Stroke scale negative. CCA negative. Pt recently diagnosed with neck issue and is c/o neck pain. Pt. Speech slurred since pain medication was given.

## 2013-03-04 NOTE — ED Notes (Signed)
Bed: BM84 Expected date: 03/04/13 Expected time: 9:08 PM Means of arrival: Ambulance Comments: Fall, hip pain

## 2013-03-04 NOTE — ED Provider Notes (Signed)
CSN: [id number]     Arrival date & time 03/04/13  2128 History   First MD Initiated Contact with Patient 03/04/13 2137     Chief Complaint  Patient presents with  . Fall  . Hip Injury   (Consider location/radiation/quality/duration/timing/severity/associated sxs/prior Treatment) HPI  This is a 72 year old female with past medical history of dementia, constipation, pelvic adhesions who presents via EMS for complaint of fall.  The patient is acutely altered and unable to give accurate history.  According to EMS notes the patient called 911 stating that she had fallen.  She is given several varying stories to EMS as well as the nursing staff and there is no amount of history at this time.  At my assessment the patient is mumbling unintelligible for quietly. Patient given 50 mcg fentanyl by EMS.   Past Medical History  Diagnosis Date  . Constipation   . Pelvic adhesions   . Diabetes mellitus   . Hypertension   . Arthritis     hips/neck  . Dementia     per spouse   Past Surgical History  Procedure Laterality Date  . Partial hysterectomy    . Uterine fibroid surgery  1992    tumor removed  . Lumbar laminectomy/decompression microdiscectomy Right 09/23/2012    Procedure: LUMBAR LAMINECTOMY/DECOMPRESSION MICRODISCECTOMY 1 LEVEL;  Surgeon: Mariam Dollar, MD;  Location: [facility];  Service: Neurosurgery;  Laterality: Right;  right lumbar five-sacral one  . Abdominal hysterectomy    . Back surgery    . Esophagogastroduodenoscopy (egd) with propofol N/A 01/10/2013    Procedure: ESOPHAGOGASTRODUODENOSCOPY (EGD) WITH PROPOFOL;  Surgeon: Charolett Bumpers, MD;  Location: [facility];  Service: Endoscopy;  Laterality: N/A;  . Colonoscopy with propofol N/A 01/10/2013    Procedure: COLONOSCOPY WITH PROPOFOL;  Surgeon: Charolett Bumpers, MD;  Location: [facility];  Service: Endoscopy;  Laterality: N/A;   Family History  Problem Relation Age of Onset  . Cancer Father     bone  . Heart disease  Mother    History  Substance Use Topics  . Smoking status: Never Smoker   . Smokeless tobacco: Never Used  . Alcohol Use: No   OB History   Grav Para Term Preterm Abortions TAB SAB Ect Mult Living   1 1        1      Review of Systems  Unable to perform ROS   Allergies  Review of patient's allergies indicates no known allergies.  Home Medications   Current Outpatient Rx  Name  Route  Sig  Dispense  Refill  . aspirin 81 MG tablet   Oral   Take 81 mg by mouth daily.         Marland Kitchen atorvastatin (LIPITOR) 20 MG tablet   Oral   Take 20 mg by mouth at bedtime.          . citalopram (CELEXA) 20 MG tablet   Oral   Take 20 mg by mouth every morning.          . cyclobenzaprine (FLEXERIL) 10 MG tablet   Oral   Take 1 tablet (10 mg total) by mouth 3 (three) times daily as needed for muscle spasms.   40 tablet   1   . insulin aspart (NOVOLOG) 100 UNIT/ML injection   Subcutaneous   Inject 0-10 Units into the skin 3 (three) times daily with meals. Before each meal 3 times a day, 140-199 - 2 units, 200-250 - 4 units, 251-299 -  6 units,  300-349 - 8 units,  350 or above 10 units.Insulin PEN if approved, provide syringes and needles if needed.   1 vial   12   . metroNIDAZOLE (FLAGYL) 500 MG tablet   Oral   Take 1 tablet (500 mg total) by mouth 2 (two) times daily.   14 tablet   0   . naproxen sodium (ANAPROX) 220 MG tablet   Oral   Take 220 mg by mouth 2 (two) times daily as needed. pain         . omeprazole (PRILOSEC) 20 MG capsule   Oral   Take 20 mg by mouth daily.          Marland Kitchen oxyCODONE (OXY IR/ROXICODONE) 5 MG immediate release tablet   Oral   Take 1 tablet (5 mg total) by mouth every 8 (eight) hours as needed. For pain   15 tablet   0   . polyethylene glycol (MIRALAX / GLYCOLAX) packet   Oral   Take 17 g by mouth daily.   14 each   0   . senna (SENOKOT) 8.6 MG TABS   Oral   Take 2 tablets by mouth daily as needed. As needed for constipation.          . traMADol (ULTRAM) 50 MG tablet   Oral   Take 50 mg by mouth every 8 (eight) hours as needed for pain. Have not had it filled         . traZODone (DESYREL) 100 MG tablet   Oral   Take 100 mg by mouth at bedtime.         . valsartan-hydrochlorothiazide (DIOVAN-HCT) 160-12.5 MG per tablet   Oral   Take 1 tablet by mouth every morning.          . Vitamin D, Ergocalciferol, (DRISDOL) 50000 UNITS CAPS   Oral   Take 50,000 Units by mouth every 7 (seven) days. Monday         . zolpidem (AMBIEN) 10 MG tablet   Oral   Take 10 mg by mouth at bedtime as needed. For sleep          BP 138/63  Pulse 72  Temp(Src) 97.6 F (36.4 C) (Rectal)  Resp 18  SpO2 97% Physical Exam  Nursing note and vitals reviewed. Constitutional: She appears well-developed and well-nourished. She appears lethargic. No distress.  HENT:  Head: Normocephalic.  Eyes: Conjunctivae are normal.  Neck:  c collar in place  Cardiovascular: Normal rate and regular rhythm.   Pulmonary/Chest: Effort normal. She has no wheezes.  Abdominal: She exhibits no distension. There is tenderness.  Neurological: She appears lethargic. GCS eye subscore is 3. GCS verbal subscore is 3. GCS motor subscore is 5.    ED Course  Procedures (including critical care time) Labs Review Labs Reviewed  CBC WITH DIFFERENTIAL - Abnormal; Notable for the following:    RBC 3.46 (*)    Hemoglobin 11.3 (*)    HCT 32.8 (*)    Platelets 123 (*)    All other components within normal limits  COMPREHENSIVE METABOLIC PANEL - Abnormal; Notable for the following:    Glucose, Bld 136 (*)    Albumin 3.1 (*)    GFR calc non Af Amer 61 (*)    GFR calc Af Amer 70 (*)    All other components within normal limits  URINALYSIS, ROUTINE W REFLEX MICROSCOPIC - Abnormal; Notable for the following:    Color, Urine AMBER (*)  Bilirubin Urine SMALL (*)    Nitrite POSITIVE (*)    Leukocytes, UA SMALL (*)    All other components within normal  limits  GLUCOSE, CAPILLARY - Abnormal; Notable for the following:    Glucose-Capillary 135 (*)    All other components within normal limits  URINE MICROSCOPIC-ADD ON - Abnormal; Notable for the following:    Bacteria, UA FEW (*)    Casts HYALINE CASTS (*)    All other components within normal limits   Imaging Review No results found.  EKG Interpretation   None       MDM   1. Fall, initial encounter   2. Weakness   3. Ataxia   4. Altered mental status   5. Abnormal gait   6. Diabetes mellitus   7. UTI (lower urinary tract infection)    11:54 PM BP 138/63  Pulse 72  Temp(Src) 97.6 F (36.4 C) (Rectal)  Resp 18  SpO2 97% Patient's husband at bedside who downgaze history.  He states that the patient had a laminectomy 09/23/2012.  By Dr. Wynetta Emery.  He states that she was doing well however has had increasingly worsening gait since that time.  He states that she began with a high stepping gait on the right side and now has severe ataxia.  He states that she is unable to walk at home even with her walker.  He states that he has to stand behind her and grasp around the chest and walk with her foot by foot to use the bathroom and get out of the bed.  She's had increasingly worsening weakness. She is diabetic with a history of hypertension.  He states that they have been unable to followup with primary care or neurosurgery.   12:57 AM Patient w with nitrite-positive urine however is minimal white blood cells or bacteria.  I will treat the patient for urinary tract infection.  She does have some hyperglycemia and mildly elevated total CK. Head CT shows no acute abnormality there is a small hypodensity in the pons seen on previous head CT but not on previous MRI. Patient will need admission for her ataxia,progressive weakness, and poor home care. She I feel she will need MRI Head and Lumbar spine   1:19 AM BP 138/63  Pulse 72  Temp(Src) 97.6 F (36.4 C) (Rectal)  Resp 18  SpO2  97% Patient is alert, following commands at this point.  She states that she was doing very well after her surgery but had a fall at home and has had difficulty walking since that time. I was able to ambulate patient with assistance. She does have proximal muscle weakness and requires assistance to stand. Gait is shuffling and appears to have mild weakness with dorsiflexion of ankle. She is unable to ambulate without assistance. The patient has grossly intact cranial nerves 2-12. DTRs with hyporeflexia. Sensation intact. F-N equal BL. Patient willl be admitted by Dr. Adela Glimpse for further evaluation.  Arthor Captain, PA-C 03/05/13 1013

## 2013-03-05 ENCOUNTER — Encounter (HOSPITAL_COMMUNITY): Payer: Self-pay | Admitting: Internal Medicine

## 2013-03-05 ENCOUNTER — Inpatient Hospital Stay (HOSPITAL_COMMUNITY): Payer: Medicare Other

## 2013-03-05 DIAGNOSIS — E119 Type 2 diabetes mellitus without complications: Secondary | ICD-10-CM

## 2013-03-05 DIAGNOSIS — N2889 Other specified disorders of kidney and ureter: Secondary | ICD-10-CM | POA: Diagnosis present

## 2013-03-05 DIAGNOSIS — W19XXXA Unspecified fall, initial encounter: Secondary | ICD-10-CM

## 2013-03-05 DIAGNOSIS — N390 Urinary tract infection, site not specified: Principal | ICD-10-CM

## 2013-03-05 DIAGNOSIS — R5381 Other malaise: Secondary | ICD-10-CM | POA: Diagnosis present

## 2013-03-05 DIAGNOSIS — R4182 Altered mental status, unspecified: Secondary | ICD-10-CM | POA: Diagnosis present

## 2013-03-05 DIAGNOSIS — R269 Unspecified abnormalities of gait and mobility: Secondary | ICD-10-CM

## 2013-03-05 LAB — CBC
HCT: 30.3 % — ABNORMAL LOW (ref 36.0–46.0)
Hemoglobin: 10.4 g/dL — ABNORMAL LOW (ref 12.0–15.0)
MCHC: 34.3 g/dL (ref 30.0–36.0)
Platelets: 113 10*3/uL — ABNORMAL LOW (ref 150–400)
RBC: 3.21 MIL/uL — ABNORMAL LOW (ref 3.87–5.11)
WBC: 7.8 10*3/uL (ref 4.0–10.5)

## 2013-03-05 LAB — GLUCOSE, CAPILLARY
Glucose-Capillary: 98 mg/dL (ref 70–99)
Glucose-Capillary: 212 mg/dL — ABNORMAL HIGH (ref 70–99)
Glucose-Capillary: 167 mg/dL — ABNORMAL HIGH (ref 70–99)
Glucose-Capillary: 145 mg/dL — ABNORMAL HIGH (ref 70–99)

## 2013-03-05 LAB — COMPREHENSIVE METABOLIC PANEL
Albumin: 2.8 g/dL — ABNORMAL LOW (ref 3.5–5.2)
BUN: 9 mg/dL (ref 6–23)
CO2: 29 mEq/L (ref 19–32)
Calcium: 8.6 mg/dL (ref 8.4–10.5)
Chloride: 107 mEq/L (ref 96–112)
Creatinine, Ser: 0.67 mg/dL (ref 0.50–1.10)
GFR calc Af Amer: >90 mL/min (ref >90)
GFR calc non Af Amer: 86 mL/min — ABNORMAL LOW (ref >90)
Glucose, Bld: 131 mg/dL — ABNORMAL HIGH (ref 70–99)
Total Bilirubin: 0.2 mg/dL — ABNORMAL LOW (ref 0.3–1.2)

## 2013-03-05 LAB — HEMOGLOBIN A1C
Hgb A1c MFr Bld: 5.5 % (ref <5.7)
Mean Plasma Glucose: 111 mg/dL (ref <117)

## 2013-03-05 MED ORDER — DEXTROSE 5 % IV SOLN: 1.0000 g | INTRAVENOUS | Status: DC

## 2013-03-05 MED ORDER — ACETAMINOPHEN 650 MG RE SUPP: 650.0000 mg | Freq: Four times a day (QID) | RECTAL | Status: DC | PRN

## 2013-03-05 MED ORDER — POLYETHYLENE GLYCOL 3350 17 G PO PACK
17.0000 g | PACK | Freq: Every day | ORAL | Status: DC
  Administered 2013-03-05 – 2013-03-08 (×4): 17 g via ORAL
  Filled 2013-03-05 (×4): qty 1

## 2013-03-05 MED ORDER — IRBESARTAN 150 MG PO TABS
150.0000 mg | ORAL_TABLET | Freq: Every day | ORAL | Status: DC
  Administered 2013-03-05: 150 mg via ORAL
  Filled 2013-03-05 (×2): qty 1

## 2013-03-05 MED ORDER — TRAZODONE HCL 100 MG PO TABS
100.0000 mg | ORAL_TABLET | Freq: Every day | ORAL | Status: DC
  Administered 2013-03-05 – 2013-03-07 (×3): 100 mg via ORAL
  Filled 2013-03-05 (×4): qty 1

## 2013-03-05 MED ORDER — SENNA 8.6 MG PO TABS: 2.0000 | ORAL_TABLET | Freq: Every day | ORAL | Status: DC | PRN

## 2013-03-05 MED ORDER — INSULIN ASPART 100 UNIT/ML ~~LOC~~ SOLN
0.0000 [IU] | Freq: Three times a day (TID) | SUBCUTANEOUS | Status: DC
  Administered 2013-03-05: 3 [IU] via SUBCUTANEOUS
  Administered 2013-03-05: 2 [IU] via SUBCUTANEOUS
  Administered 2013-03-06: 1 [IU] via SUBCUTANEOUS
  Administered 2013-03-06 – 2013-03-07 (×3): 2 [IU] via SUBCUTANEOUS
  Administered 2013-03-08: 1 [IU] via SUBCUTANEOUS

## 2013-03-05 MED ORDER — ONDANSETRON HCL 4 MG/2ML IJ SOLN: 4.0000 mg | Freq: Four times a day (QID) | INTRAMUSCULAR | Status: DC | PRN

## 2013-03-05 MED ORDER — ACETAMINOPHEN 325 MG PO TABS
650.0000 mg | ORAL_TABLET | Freq: Four times a day (QID) | ORAL | Status: DC | PRN
  Administered 2013-03-08: 650 mg via ORAL
  Filled 2013-03-05: qty 2

## 2013-03-05 MED ORDER — INSULIN ASPART 100 UNIT/ML ~~LOC~~ SOLN: 0.0000 [IU] | Freq: Every day | SUBCUTANEOUS | Status: DC

## 2013-03-05 MED ORDER — CITALOPRAM HYDROBROMIDE 20 MG PO TABS
20.0000 mg | ORAL_TABLET | Freq: Every morning | ORAL | Status: DC
  Administered 2013-03-05 – 2013-03-08 (×4): 20 mg via ORAL
  Filled 2013-03-05 (×4): qty 1

## 2013-03-05 MED ORDER — HYDRALAZINE HCL 20 MG/ML IJ SOLN: 10.0000 mg | Freq: Four times a day (QID) | INTRAMUSCULAR | Status: DC | PRN

## 2013-03-05 MED ORDER — SODIUM CHLORIDE 0.9 % IJ SOLN
3.0000 mL | Freq: Two times a day (BID) | INTRAMUSCULAR | Status: DC
  Administered 2013-03-05 – 2013-03-08 (×2): 3 mL via INTRAVENOUS

## 2013-03-05 MED ORDER — ASPIRIN 81 MG PO CHEW
81.0000 mg | CHEWABLE_TABLET | Freq: Every morning | ORAL | Status: DC
  Administered 2013-03-05 – 2013-03-08 (×4): 81 mg via ORAL
  Filled 2013-03-05 (×5): qty 1

## 2013-03-05 MED ORDER — ATORVASTATIN CALCIUM 20 MG PO TABS
20.0000 mg | ORAL_TABLET | Freq: Every day | ORAL | Status: DC
  Administered 2013-03-05 – 2013-03-07 (×3): 20 mg via ORAL
  Filled 2013-03-05 (×4): qty 1

## 2013-03-05 MED ORDER — PANTOPRAZOLE SODIUM 40 MG PO TBEC
40.0000 mg | DELAYED_RELEASE_TABLET | Freq: Every day | ORAL | Status: DC
  Administered 2013-03-05 – 2013-03-08 (×4): 40 mg via ORAL
  Filled 2013-03-05 (×4): qty 1

## 2013-03-05 MED ORDER — DOCUSATE SODIUM 100 MG PO CAPS
100.0000 mg | ORAL_CAPSULE | Freq: Two times a day (BID) | ORAL | Status: DC
  Administered 2013-03-05 – 2013-03-08 (×7): 100 mg via ORAL
  Filled 2013-03-05 (×8): qty 1

## 2013-03-05 MED ORDER — DIAZEPAM 5 MG PO TABS
5.0000 mg | ORAL_TABLET | Freq: Two times a day (BID) | ORAL | Status: DC | PRN
  Administered 2013-03-05 – 2013-03-06 (×3): 5 mg via ORAL
  Filled 2013-03-05 (×3): qty 1

## 2013-03-05 MED ORDER — OXYCODONE HCL 5 MG PO TABS
5.0000 mg | ORAL_TABLET | Freq: Four times a day (QID) | ORAL | Status: DC | PRN
  Administered 2013-03-05 – 2013-03-08 (×7): 5 mg via ORAL
  Filled 2013-03-05 (×7): qty 1

## 2013-03-05 MED ORDER — ONDANSETRON HCL 4 MG PO TABS: 4.0000 mg | ORAL_TABLET | Freq: Four times a day (QID) | ORAL | Status: DC | PRN

## 2013-03-05 MED ORDER — VALSARTAN-HYDROCHLOROTHIAZIDE 160-12.5 MG PO TABS: 1.0000 | ORAL_TABLET | Freq: Every morning | ORAL | Status: DC

## 2013-03-05 MED ORDER — SODIUM CHLORIDE 0.9 % IV SOLN
INTRAVENOUS | Status: DC
  Administered 2013-03-05: 08:00:00 via INTRAVENOUS

## 2013-03-05 MED ORDER — SODIUM CHLORIDE 0.9 % IJ SOLN: 3.0000 mL | INTRAMUSCULAR | Status: DC | PRN

## 2013-03-05 MED ORDER — HYDROCODONE-ACETAMINOPHEN 5-325 MG PO TABS
1.0000 | ORAL_TABLET | ORAL | Status: DC | PRN
  Administered 2013-03-05: 1 via ORAL
  Filled 2013-03-05: qty 1

## 2013-03-05 MED ORDER — GADOBENATE DIMEGLUMINE 529 MG/ML IV SOLN
15.0000 mL | Freq: Once | INTRAVENOUS | Status: AC | PRN
  Administered 2013-03-05: 15 mL via INTRAVENOUS

## 2013-03-05 MED ORDER — HYDROCHLOROTHIAZIDE 12.5 MG PO CAPS
12.5000 mg | ORAL_CAPSULE | Freq: Every day | ORAL | Status: DC
  Administered 2013-03-05: 12.5 mg via ORAL
  Filled 2013-03-05 (×2): qty 1

## 2013-03-05 MED ORDER — SODIUM CHLORIDE 0.9 % IV SOLN
250.0000 mL | INTRAVENOUS | Status: DC | PRN
  Administered 2013-03-05: 250 mL via INTRAVENOUS

## 2013-03-05 NOTE — H&P (Signed)
PCP:  [name]   Chief Complaint:  Trouble walking  HPI: Valerie Sullivan is a 72 y.o. female   has a past medical history of Constipation; Pelvic adhesions; Diabetes mellitus; Hypertension; Arthritis; and Dementia.   Presented with  Husband went to the store when he returned she was being transported to ER by EMS. He thinks that she must have gotten up and tried to walk. Patient states she got up to use a bathroom and has fallen. Patient had her back operated on in July by Dr. Wynetta Emery. Since then she has had a fall as she was trying to get to the door. Since September she started to have progressively worsening unstable and shuffling gait. She also has bilateral leg weakness that has been progressive. Family also noted hand tremor.  Patient has history of dementia. She is unable to provide good history.   The patient was brought in to emergency department initially she appeared to be altered and confused this was in the setting of patient receiving fentanyl in route which apparently oversedated her. Once patient became more alert she was able to provide some her own history.  Review of Systems:    Pertinent positives include: confusion, weakness, gait abnormality,  Constitutional:  No weight loss, night sweats, Fevers, chills, fatigue, weight loss  HEENT:  No headaches, Difficulty swallowing,Tooth/dental problems,Sore throat,  No sneezing, itching, ear ache, nasal congestion, post nasal drip,  Cardio-vascular:  No chest pain, Orthopnea, PND, anasarca, dizziness, palpitations.no Bilateral lower extremity swelling  GI:  No heartburn, indigestion, abdominal pain, nausea, vomiting, diarrhea, change in bowel habits, loss of appetite, melena, blood in stool, hematemesis Resp:  no shortness of breath at rest. No dyspnea on exertion, No excess mucus, no productive cough, No non-productive cough, No coughing up of blood.No change in color of mucus.No wheezing. Skin:  no rash or lesions. No  jaundice GU:  no dysuria, change in color of urine, no urgency or frequency. No straining to urinate.  No flank pain.  Musculoskeletal:  No joint pain or no joint swelling. No decreased range of motion. No back pain.  Psych:  No change in mood or affect. No depression or anxiety. No memory loss.  Neuro: no localizing neurological complaints, no tingling, no, no double vision, no slurred speech, no confusion  Otherwise ROS are negative except for above, 10 systems were reviewed  Past Medical History: Past Medical History  Diagnosis Date  . Constipation   . Pelvic adhesions   . Diabetes mellitus   . Hypertension   . Arthritis     hips/neck  . Dementia     per spouse   Past Surgical History  Procedure Laterality Date  . Partial hysterectomy    . Uterine fibroid surgery  1992    tumor removed  . Lumbar laminectomy/decompression microdiscectomy Right 09/23/2012    Procedure: LUMBAR LAMINECTOMY/DECOMPRESSION MICRODISCECTOMY 1 LEVEL;  Surgeon: Mariam Dollar, MD;  Location: [facility];  Service: Neurosurgery;  Laterality: Right;  right lumbar five-sacral one  . Abdominal hysterectomy    . Back surgery    . Esophagogastroduodenoscopy (egd) with propofol N/A 01/10/2013    Procedure: ESOPHAGOGASTRODUODENOSCOPY (EGD) WITH PROPOFOL;  Surgeon: Charolett Bumpers, MD;  Location: [facility];  Service: Endoscopy;  Laterality: N/A;  . Colonoscopy with propofol N/A 01/10/2013    Procedure: COLONOSCOPY WITH PROPOFOL;  Surgeon: Charolett Bumpers, MD;  Location: [facility];  Service: Endoscopy;  Laterality: N/A;     Medications: Prior to Admission medications  Medication Sig Start Date End Date Taking? Authorizing Provider  aspirin 81 MG tablet Take 81 mg by mouth daily.    Historical Provider, MD  atorvastatin (LIPITOR) 20 MG tablet Take 20 mg by mouth at bedtime.     Historical Provider, MD  citalopram (CELEXA) 20 MG tablet Take 20 mg by mouth every morning.     Historical Provider, MD   cyclobenzaprine (FLEXERIL) 10 MG tablet Take 1 tablet (10 mg total) by mouth 3 (three) times daily as needed for muscle spasms. 09/27/12   Mariam Dollar, MD  insulin aspart (NOVOLOG) 100 UNIT/ML injection Inject 0-10 Units into the skin 3 (three) times daily with meals. Before each meal 3 times a day, 140-199 - 2 units, 200-250 - 4 units, 251-299 - 6 units,  300-349 - 8 units,  350 or above 10 units.Insulin PEN if approved, provide syringes and needles if needed. 08/10/12   Leroy Sea, MD  metroNIDAZOLE (FLAGYL) 500 MG tablet Take 1 tablet (500 mg total) by mouth 2 (two) times daily. 02/17/13   Reuben Likes, MD  naproxen sodium (ANAPROX) 220 MG tablet Take 220 mg by mouth 2 (two) times daily as needed. pain    Historical Provider, MD  omeprazole (PRILOSEC) 20 MG capsule Take 20 mg by mouth daily.     Historical Provider, MD  oxyCODONE (OXY IR/ROXICODONE) 5 MG immediate release tablet Take 1 tablet (5 mg total) by mouth every 8 (eight) hours as needed. For pain 08/10/12   Leroy Sea, MD  polyethylene glycol (MIRALAX / GLYCOLAX) packet Take 17 g by mouth daily. 02/15/13   Mora Bellman, PA-C  senna (SENOKOT) 8.6 MG TABS Take 2 tablets by mouth daily as needed. As needed for constipation.    Historical Provider, MD  traMADol (ULTRAM) 50 MG tablet Take 50 mg by mouth every 8 (eight) hours as needed for pain. Have not had it filled 12/07/12   Alison Murray, MD  traZODone (DESYREL) 100 MG tablet Take 100 mg by mouth at bedtime.    Historical Provider, MD  valsartan-hydrochlorothiazide (DIOVAN-HCT) 160-12.5 MG per tablet Take 1 tablet by mouth every morning.     Historical Provider, MD  Vitamin D, Ergocalciferol, (DRISDOL) 50000 UNITS CAPS Take 50,000 Units by mouth every 7 (seven) days. Monday    Historical Provider, MD    Allergies:  No Known Allergies  Social History:  Ambulatory needs total assist, frequent falls Lives at  Home with family.   reports that she has never smoked. She has  never used smokeless tobacco. She reports that she does not drink alcohol or use illicit drugs.   Family History: family history includes Cancer in her father; Heart disease in her mother.    Physical Exam: Patient Vitals for the past 24 hrs:  BP Temp Temp src Pulse Resp SpO2  03/04/13 2302 - 97.6 F (36.4 C) Rectal - - -  03/04/13 2147 - - - - - 97 %  03/04/13 2141 138/63 mmHg 98.7 F (37.1 C) Oral 72 18 95 %  03/04/13 2134 - - - - - 94 %    1. General:  in No Acute distress 2. Psychological: Alert not fully Oriented 3. Head/ENT:   MoistMucous Membranes                          Head Non traumatic, neck supple  Normal  Dentition 4. SKIN: normal Skin turgor,  Skin clean Dry and intact no rash 5. Heart: Regular rate and rhythm no Murmur, Rub or gallop 6. Lungs: Clear to auscultation bilaterally, no wheezes or crackles   7. Abdomen: Soft, non-tender, Non distended 8. Lower extremities: no clubbing, cyanosis, or edema 9. Neurologically diminished strength on the low extremities bilaterally fairly equally. A collateral Babinski's bilaterally. Upper extremity strength is well-preserved. Cranial nerves II through XII appear to be intact 10. MSK: Normal range of motion  body mass index is unknown because there is no weight on file.   Labs on Admission:   Recent Labs  03/04/13 2310  NA 144  K 3.6  CL 107  CO2 31  GLUCOSE 136*  BUN 12  CREATININE 0.92  CALCIUM 9.1    Recent Labs  03/04/13 2310  AST 26  ALT 20  ALKPHOS 85  BILITOT 0.3  PROT 6.0  ALBUMIN 3.1*   No results found for this basename: LIPASE, AMYLASE,  in the last 72 hours  Recent Labs  03/04/13 2310  WBC 7.6  NEUTROABS 5.1  HGB 11.3*  HCT 32.8*  MCV 94.8  PLT 123*    Recent Labs  03/04/13 2350  CKTOTAL 396*   No results found for this basename: TSH, T4TOTAL, FREET3, T3FREE, THYROIDAB,  in the last 72 hours No results found for this basename: VITAMINB12, FOLATE,  FERRITIN, TIBC, IRON, RETICCTPCT,  in the last 72 hours Lab Results  Component Value Date   HGBA1C 5.0 08/09/2012    The CrCl is unknown because both a height and weight (above a minimum accepted value) are required for this calculation. ABG    Component Value Date/Time   TCO2 24 12/10/2010 1454     No results found for this basename: DDIMER      UA no WBCs positive for mild nitrites and a few bacteria  Cultures:    Component Value Date/Time   SDES URINE, RANDOM 11/30/2012 1956   SPECREQUEST NONE 11/30/2012 1956   CULT  Value: NO GROWTH Performed at [facility] 11/30/2012 1956   REPTSTATUS 12/02/2012 FINAL 11/30/2012 1956       Radiological Exams on Admission: Dg Chest 2 View  03/04/2013   CLINICAL DATA:  Fall, altered mental status.  EXAM: CHEST  2 VIEW  COMPARISON:  Chest radiograph September 14, 2012  FINDINGS: Cardiomediastinal silhouette is unremarkable. Strandy densities in left lung base. The lungs are otherwise clear without pleural effusions or focal consolidations. Pulmonary vasculature is unremarkable. Trachea projects midline and there is no pneumothorax. Soft tissue planes and included osseous structures are nonsuspicious. Slight cortical irregularity of the lateral 7th rib may reflect remote injury.  IMPRESSION: Left lower lobe atelectasis.   Electronically Signed   By: Awilda Metro   On: 03/04/2013 23:53   Ct Head Wo Contrast  03/05/2013   CLINICAL DATA:  Fall with hip injury  EXAM: CT HEAD WITHOUT CONTRAST  CT CERVICAL SPINE WITHOUT CONTRAST  TECHNIQUE: Multidetector CT imaging of the head and cervical spine was performed following the standard protocol without intravenous contrast. Multiplanar CT image reconstructions of the cervical spine were also generated.  COMPARISON:  08/08/2012 head CT  FINDINGS: CT HEAD FINDINGS  Skull and Sinuses:Four calvarial osteomas, two along the frontal bone and two along the left parietal bone.  Orbits: Bilateral cataract  resection.  Brain: No evidence of acute abnormality, such as acute infarction, hemorrhage, hydrocephalus, or mass lesion/mass effect. Stable pattern of patchy  bilateral cerebral white matter low density. There is a low-density structure in the right of midline pons, also seen previously (although not seen on subsequent brain MRI).  CT CERVICAL SPINE FINDINGS  Negative for acute fracture. Mild C5-C6 and C7-T1 anterolisthesis can be explained by facet osteoarthritis. No prevertebral edema. No gross cervical canal hematoma. Diffuse degenerative disc disease, especially in the mid and lower cervical spine. No high-grade canal or foraminal stenosis identified. There bilateral thyroid nodules, the larger on the right measuring 13 mm. These are likely incidental based on age. No neighboring adenopathy.  IMPRESSION: 1. No evidence of acute intracranial injury. 2. No evidence of acute cervical spine injury. 3. Stable intracranial chronic small vessel ischemia.   Electronically Signed   By: Tiburcio Pea M.D.   On: 03/05/2013 00:02   Ct Cervical Spine Wo Contrast  03/05/2013   CLINICAL DATA:  Fall with hip injury  EXAM: CT HEAD WITHOUT CONTRAST  CT CERVICAL SPINE WITHOUT CONTRAST  TECHNIQUE: Multidetector CT imaging of the head and cervical spine was performed following the standard protocol without intravenous contrast. Multiplanar CT image reconstructions of the cervical spine were also generated.  COMPARISON:  08/08/2012 head CT  FINDINGS: CT HEAD FINDINGS  Skull and Sinuses:Four calvarial osteomas, two along the frontal bone and two along the left parietal bone.  Orbits: Bilateral cataract resection.  Brain: No evidence of acute abnormality, such as acute infarction, hemorrhage, hydrocephalus, or mass lesion/mass effect. Stable pattern of patchy bilateral cerebral white matter low density. There is a low-density structure in the right of midline pons, also seen previously (although not seen on subsequent brain MRI).   CT CERVICAL SPINE FINDINGS  Negative for acute fracture. Mild C5-C6 and C7-T1 anterolisthesis can be explained by facet osteoarthritis. No prevertebral edema. No gross cervical canal hematoma. Diffuse degenerative disc disease, especially in the mid and lower cervical spine. No high-grade canal or foraminal stenosis identified. There bilateral thyroid nodules, the larger on the right measuring 13 mm. These are likely incidental based on age. No neighboring adenopathy.  IMPRESSION: 1. No evidence of acute intracranial injury. 2. No evidence of acute cervical spine injury. 3. Stable intracranial chronic small vessel ischemia.   Electronically Signed   By: Tiburcio Pea M.D.   On: 03/05/2013 00:02    Chart has been reviewed  Assessment/Plan  Is a 71 year old female with progressive low extremity bilateral weakness leading to difficulty in ambulation. Presented with fall altered mental status mild UTI. Of note she has right renal mass likely renal cell carcinoma which is small and stable but needs to be addressed  Present on Admission:  . Abnormal gait - patient has had prior L5-S1 disc surgery. She had had an MRI done since which showed stable disc protrusions. Will repeat lumbar MRI if she can tolerate to see if there is any changes since. If abnormal would need neurosurgery consult. Would evaluate for any spinal cord compression. This has been slow to progress over past few months. No recent acute changes. Other possibilities could be Parkinson disease versus other types of dementia. Would recommend neurology consult if MRI is inconclusive.  . Diabetes mellitus -sliding scale  . HTN (hypertension) - continue home meds  . Debility - PTOT evaluation will also change Valium to 5 mg as needed and is trying to avoid over sedation  . Altered mental status - this was in the setting of narcotic use for pain control currently improved also setting of mild UTI will treat  . Right  kidney mass -this seemed to  be finding noted one year ago but I do not see any evidence is being followed up. This needs to be in referred to the urology on an outpatient basis. The mass had not changed significantly over past 1 year but still likely malignant  . UTI (lower urinary tract infection) -mild will treat with Rocephin   Prophylaxis: SCD, Protonix  CODE STATUS: Full code  Other plan as per orders.  I have spent a total of 55 min on this admission  Kenyah Luba 03/05/2013, 1:43 AM

## 2013-03-05 NOTE — Progress Notes (Signed)
PT Cancellation Note  Patient Details Name: Valerie Sullivan MRN: [id number] DOB: 28-Jul-1940   Cancelled Treatment:    Reason Eval/Treat Not Completed: Other (comment) (husband requested to let her rest)   [facility] 03/05/2013, 4:08 PM

## 2013-03-05 NOTE — Progress Notes (Addendum)
Pt seen and examined, admitted this am by Dr.Doutova  1. Progressive leg weakness  July had  L5-S1 laminectomy and discectomy with microscopic dissection   disc surgery per Dr.Cram -FU MRI LS Spine -PT eval -worsening weakness and gait disorder since september  . Diabetes mellitus  -sliding scale  -FU hbaic  . HTN (hypertension)  -BP uncontrolled - continue home meds, add PRN hydralazine  . Right kidney mass -followed by Dr.Nesi with surveillance at this time  . UTI -doubt UTI and asymptomatic -Dc rocephin  Dementia  Prophylaxis: SCD, Protonix   CODE STATUS: Full code  D/w husband at bedside  Zannie Cove, MD [phone]

## 2013-03-05 NOTE — ED Provider Notes (Signed)
Medical screening examination/treatment/procedure(s) were conducted as a shared visit with non-physician practitioner(s) and myself.  I personally evaluated the patient during the encounter.  EKG Interpretation    Date/Time:    Ventricular Rate:    PR Interval:    QRS Duration:   QT Interval:    QTC Calculation:   R Axis:     Text Interpretation:             Pt with worsening ataxia, increased falls.  Concern for safety at home.  To be admitted for further w/u of ataxia, weakness.  Olivia Mackie, MD 03/05/13 2132

## 2013-03-06 DIAGNOSIS — R4182 Altered mental status, unspecified: Secondary | ICD-10-CM

## 2013-03-06 DIAGNOSIS — R279 Unspecified lack of coordination: Secondary | ICD-10-CM

## 2013-03-06 LAB — GLUCOSE, CAPILLARY
Glucose-Capillary: 108 mg/dL — ABNORMAL HIGH (ref 70–99)
Glucose-Capillary: 129 mg/dL — ABNORMAL HIGH (ref 70–99)

## 2013-03-06 LAB — FOLATE RBC: RBC Folate: 794 ng/mL — ABNORMAL HIGH (ref >280)

## 2013-03-06 MED ORDER — POTASSIUM CHLORIDE CRYS ER 20 MEQ PO TBCR
40.0000 meq | EXTENDED_RELEASE_TABLET | Freq: Every day | ORAL | Status: DC
  Administered 2013-03-06 – 2013-03-08 (×3): 40 meq via ORAL
  Filled 2013-03-06 (×3): qty 2

## 2013-03-06 MED ORDER — IRBESARTAN 300 MG PO TABS
300.0000 mg | ORAL_TABLET | Freq: Every day | ORAL | Status: DC
  Administered 2013-03-06 – 2013-03-08 (×3): 300 mg via ORAL
  Filled 2013-03-06 (×3): qty 1

## 2013-03-06 MED ORDER — IRBESARTAN 150 MG PO TABS: 150.0000 mg | ORAL_TABLET | Freq: Every day | ORAL | Status: DC

## 2013-03-06 MED ORDER — HYDROCHLOROTHIAZIDE 12.5 MG PO CAPS
12.5000 mg | ORAL_CAPSULE | Freq: Every day | ORAL | Status: DC
  Administered 2013-03-06 – 2013-03-08 (×3): 12.5 mg via ORAL
  Filled 2013-03-06 (×3): qty 1

## 2013-03-06 MED ORDER — DEXTROSE 5 % IV SOLN
1.0000 g | INTRAVENOUS | Status: DC
  Administered 2013-03-06 – 2013-03-07 (×2): 1 g via INTRAVENOUS
  Filled 2013-03-06 (×2): qty 10

## 2013-03-06 NOTE — Consult Note (Signed)
NEURO HOSPITALIST CONSULT NOTE    Reason for Consult: 2-3 moth history of unsteady gait.    HPI:                                                                                                                                          Valerie Sullivan is an 72 y.o. female who was seen by Dr. Wynetta Emery in July for right L5-S1 laminectomy.  Patient was doing well at that point but husband did not a decline in her memory at that time.  Since September, she has had a fairly precipitous decline in her gait, memory, ability to care for herself, ability to recall conversations that were recently had.  Husband states he baths her, feeds, her, does toileting for her, he will not allow her to drive at his point due to the fact she will run into objects ("her preception of distance is off"). Husband also noted a postural action tremor when she reaches out to grab objects but no resting tremor. Husband and patient deny any incontinence either urinary or bowel.   Past Medical History  Diagnosis Date  . Constipation   . Pelvic adhesions   . Diabetes mellitus   . Hypertension   . Arthritis     hips/neck  . Dementia     per spouse    Past Surgical History  Procedure Laterality Date  . Partial hysterectomy    . Uterine fibroid surgery  1992    tumor removed  . Lumbar laminectomy/decompression microdiscectomy Right 09/23/2012    Procedure: LUMBAR LAMINECTOMY/DECOMPRESSION MICRODISCECTOMY 1 LEVEL;  Surgeon: Mariam Dollar, MD;  Location: [facility];  Service: Neurosurgery;  Laterality: Right;  right lumbar five-sacral one  . Abdominal hysterectomy    . Back surgery    . Esophagogastroduodenoscopy (egd) with propofol N/A 01/10/2013    Procedure: ESOPHAGOGASTRODUODENOSCOPY (EGD) WITH PROPOFOL;  Surgeon: Charolett Bumpers, MD;  Location: [facility];  Service: Endoscopy;  Laterality: N/A;  . Colonoscopy with propofol N/A 01/10/2013    Procedure: COLONOSCOPY WITH PROPOFOL;  Surgeon: Charolett Bumpers, MD;  Location: [facility];  Service: Endoscopy;  Laterality: N/A;    Family History  Problem Relation Age of Onset  . Cancer Father     bone  . Heart disease Mother    Social History:  reports that she has never smoked. She has never used smokeless tobacco. She reports that she does not drink alcohol or use illicit drugs.  No Known Allergies  MEDICATIONS:  Prior to Admission:  Prescriptions prior to admission  Medication Sig Dispense Refill  . aspirin 81 MG tablet Take 81 mg by mouth every morning.       Marland Kitchen atorvastatin (LIPITOR) 20 MG tablet Take 20 mg by mouth at bedtime.       . citalopram (CELEXA) 20 MG tablet Take 20 mg by mouth every morning.       . diazepam (VALIUM) 5 MG tablet Take 10 mg by mouth at bedtime.      . insulin aspart (NOVOLOG) 100 UNIT/ML injection Inject 0-10 Units into the skin 3 (three) times daily with meals. Before each meal 3 times a day, 140-199 - 2 units, 200-250 - 4 units, 251-299 - 6 units,  300-349 - 8 units,  350 or above 10 units.Insulin PEN if approved, provide syringes and needles if needed.  1 vial  12  . naproxen sodium (ANAPROX) 220 MG tablet Take 220 mg by mouth 2 (two) times daily as needed (pain). pain      . omeprazole (PRILOSEC) 20 MG capsule Take 20 mg by mouth daily.       Marland Kitchen oxyCODONE (OXY IR/ROXICODONE) 5 MG immediate release tablet Take 1 tablet (5 mg total) by mouth every 8 (eight) hours as needed. For pain  15 tablet  0  . polyethylene glycol (MIRALAX / GLYCOLAX) packet Take 17 g by mouth daily.  14 each  0  . senna (SENOKOT) 8.6 MG TABS Take 2 tablets by mouth daily as needed for mild constipation. As needed for constipation.      . traZODone (DESYREL) 100 MG tablet Take 100 mg by mouth at bedtime.      . valsartan-hydrochlorothiazide (DIOVAN-HCT) 160-12.5 MG per tablet Take 1 tablet by mouth every morning.        . Vitamin D, Ergocalciferol, (DRISDOL) 50000 UNITS CAPS Take 50,000 Units by mouth every 7 (seven) days. Monday       Scheduled: . aspirin  81 mg Oral q morning - 10a  . atorvastatin  20 mg Oral QHS  . cefTRIAXone (ROCEPHIN)  IV  1 g Intravenous Q24H  . citalopram  20 mg Oral q morning - 10a  . docusate sodium  100 mg Oral BID  . irbesartan  300 mg Oral Daily   And  . hydrochlorothiazide  12.5 mg Oral Daily  . insulin aspart  0-5 Units Subcutaneous QHS  . insulin aspart  0-9 Units Subcutaneous TID WC  . pantoprazole  40 mg Oral Daily  . polyethylene glycol  17 g Oral Daily  . potassium chloride  40 mEq Oral Daily  . sodium chloride  3 mL Intravenous Q12H  . traZODone  100 mg Oral QHS   Continuous:    ROS:  History obtained from the patient and husband  General ROS: negative for - chills, fatigue, fever, night sweats, weight gain or weight loss Psychological ROS: negative for - behavioral disorder, hallucinations, memory difficulties, mood swings or suicidal ideation Ophthalmic ROS: negative for - blurry vision, double vision, eye pain or loss of vision ENT ROS: negative for - epistaxis, nasal discharge, oral lesions, sore throat, tinnitus or vertigo Allergy and Immunology ROS: negative for - hives or itchy/watery eyes Hematological and Lymphatic ROS: negative for - bleeding problems, bruising or swollen lymph nodes Endocrine ROS: negative for - galactorrhea, hair pattern changes, polydipsia/polyuria or temperature intolerance Respiratory ROS: negative for - cough, hemoptysis, shortness of breath or wheezing Cardiovascular ROS: negative for - chest pain, dyspnea on exertion, edema or irregular heartbeat Gastrointestinal ROS: negative for - abdominal pain, diarrhea, hematemesis, nausea/vomiting or stool incontinence Genito-Urinary ROS: negative for -  dysuria, hematuria, incontinence or urinary frequency/urgency Musculoskeletal ROS: negative for - joint swelling or muscular weakness Neurological ROS: as noted in HPI Dermatological ROS: negative for rash and skin lesion changes   Blood pressure 170/98, pulse 71, temperature 97.9 F (36.6 C), temperature source Oral, resp. rate 18, height 5\' 2"  (1.575 m), weight 71.668 kg (158 lb), SpO2 93.00%.   Neurologic Examination:                                                                                                      Mental Status: Alert, oriented, thought content appropriate.  Speech fluent without evidence of aphasia.  Able to follow 3 step commands without difficulty. Able to name objects but unable to count backward with serial 7's, unable to recall three objects, unable to name more than 4 animals in one minute.  Cranial Nerves: II: Discs flat bilaterally; Visual fields grossly normal, pupils equal, round, reactive to light and accommodation III,IV, VI: ptosis not present, extra-ocular motions intact bilaterally V,VII: smile symmetric, facial light touch sensation normal bilaterally VIII: hearing normal bilaterally IX,X: gag reflex present XI: bilateral shoulder shrug XII: midline tongue extension   Motor: Right : Upper extremity   5/5    Left:     Upper extremity   5/5  Lower extremity   5/5     Lower extremity   5/5 Tone and bulk:increased tone.  No atrophy noted.  Bradykinesia. Sensory: she shows decreased vibratory and temperature sensation bilaterally up to mid calf.  Proprioception and pain is intact.  Deep Tendon Reflexes:  Right: Upper Extremity   Left: Upper extremity   biceps (C-5 to C-6) 2/4   biceps (C-5 to C-6) 2/4 tricep (C7) 2/4    triceps (C7) 2/4 Brachioradialis (C6) 2/4  Brachioradialis (C6) 2/4  Lower Extremity Lower Extremity  quadriceps (L-2 to L-4) 2/4   quadriceps (L-2 to L-4) 2/4 Achilles (S1) 0/4   Achilles (S1) 0/4  Plantars: Right:  downgoing   Left: downgoing Cerebellar: normal finger-to-nose,  normal heel-to-shin test.  Minimal tremor.   Gait: she is able to get up from seated position with no help or upper body assistance, able to initiate step well but  then walked with wide based shuffle steps.  multi step turn but no retropulsion. With walker she lifted feet with ease and able gait was slightly more narrow.  CV: pulses palpable throughout    No results found for this basename: cbc, bmp, coags, chol, tri, ldl, hga1c    Results for orders placed during the hospital encounter of 03/04/13 (from the past 48 hour(s))  GLUCOSE, CAPILLARY     Status: Abnormal   Collection Time    03/04/13 10:45 PM      Result Value Range   Glucose-Capillary 135 (*) 70 - 99 mg/dL  URINALYSIS, ROUTINE W REFLEX MICROSCOPIC     Status: Abnormal   Collection Time    03/04/13 10:55 PM      Result Value Range   Color, Urine AMBER (*) YELLOW   Comment: BIOCHEMICALS MAY BE AFFECTED BY COLOR   APPearance CLEAR  CLEAR   Specific Gravity, Urine 1.019  1.005 - 1.030   pH 5.5  5.0 - 8.0   Glucose, UA NEGATIVE  NEGATIVE mg/dL   Hgb urine dipstick NEGATIVE  NEGATIVE   Bilirubin Urine SMALL (*) NEGATIVE   Ketones, ur NEGATIVE  NEGATIVE mg/dL   Protein, ur NEGATIVE  NEGATIVE mg/dL   Urobilinogen, UA 1.0  0.0 - 1.0 mg/dL   Nitrite POSITIVE (*) NEGATIVE   Leukocytes, UA SMALL (*) NEGATIVE  URINE MICROSCOPIC-ADD ON     Status: Abnormal   Collection Time    03/04/13 10:55 PM      Result Value Range   WBC, UA 0-2  <3 WBC/hpf   RBC / HPF 0-2  <3 RBC/hpf   Bacteria, UA FEW (*) RARE   Casts HYALINE CASTS (*) NEGATIVE  CBC WITH DIFFERENTIAL     Status: Abnormal   Collection Time    03/04/13 11:10 PM      Result Value Range   WBC 7.6  4.0 - 10.5 K/uL   RBC 3.46 (*) 3.87 - 5.11 MIL/uL   Hemoglobin 11.3 (*) 12.0 - 15.0 g/dL   HCT 16.1 (*) 09.6 - 04.5 %   MCV 94.8  78.0 - 100.0 fL   MCH 32.7  26.0 - 34.0 pg   MCHC 34.5  30.0 - 36.0 g/dL   RDW  40.9  81.1 - 91.4 %   Platelets 123 (*) 150 - 400 K/uL   Neutrophils Relative % 67  43 - 77 %   Neutro Abs 5.1  1.7 - 7.7 K/uL   Lymphocytes Relative 22  12 - 46 %   Lymphs Abs 1.6  0.7 - 4.0 K/uL   Monocytes Relative 10  3 - 12 %   Monocytes Absolute 0.8  0.1 - 1.0 K/uL   Eosinophils Relative 1  0 - 5 %   Eosinophils Absolute 0.1  0.0 - 0.7 K/uL   Basophils Relative 0  0 - 1 %   Basophils Absolute 0.0  0.0 - 0.1 K/uL  COMPREHENSIVE METABOLIC PANEL     Status: Abnormal   Collection Time    03/04/13 11:10 PM      Result Value Range   Sodium 144  135 - 145 mEq/L   Potassium 3.6  3.5 - 5.1 mEq/L   Chloride 107  96 - 112 mEq/L   CO2 31  19 - 32 mEq/L   Glucose, Bld 136 (*) 70 - 99 mg/dL   BUN 12  6 - 23 mg/dL   Creatinine, Ser 7.82  0.50 - 1.10 mg/dL  Calcium 9.1  8.4 - 10.5 mg/dL   Total Protein 6.0  6.0 - 8.3 g/dL   Albumin 3.1 (*) 3.5 - 5.2 g/dL   AST 26  0 - 37 U/L   ALT 20  0 - 35 U/L   Alkaline Phosphatase 85  39 - 117 U/L   Total Bilirubin 0.3  0.3 - 1.2 mg/dL   GFR calc non Af Amer 61 (*) >90 mL/min   GFR calc Af Amer 70 (*) >90 mL/min   Comment: (NOTE)     The eGFR has been calculated using the CKD EPI equation.     This calculation has not been validated in all clinical situations.     eGFR's persistently <90 mL/min signify possible Chronic Kidney     Disease.  CK     Status: Abnormal   Collection Time    03/04/13 11:50 PM      Result Value Range   Total CK 396 (*) 7 - 177 U/L  MAGNESIUM     Status: None   Collection Time    03/05/13  5:00 AM      Result Value Range   Magnesium 1.7  1.5 - 2.5 mg/dL  PHOSPHORUS     Status: Abnormal   Collection Time    03/05/13  5:00 AM      Result Value Range   Phosphorus 2.0 (*) 2.3 - 4.6 mg/dL  TSH     Status: None   Collection Time    03/05/13  5:00 AM      Result Value Range   TSH 1.491  0.350 - 4.500 uIU/mL   Comment: Performed at [facility]  COMPREHENSIVE METABOLIC PANEL     Status: Abnormal    Collection Time    03/05/13  5:00 AM      Result Value Range   Sodium 141  135 - 145 mEq/L   Potassium 2.8 (*) 3.5 - 5.1 mEq/L   Comment: REPEATED TO VERIFY     DELTA CHECK NOTED   Chloride 107  96 - 112 mEq/L   CO2 29  19 - 32 mEq/L   Glucose, Bld 131 (*) 70 - 99 mg/dL   BUN 9  6 - 23 mg/dL   Creatinine, Ser 1.61  0.50 - 1.10 mg/dL   Calcium 8.6  8.4 - 09.6 mg/dL   Total Protein 5.5 (*) 6.0 - 8.3 g/dL   Albumin 2.8 (*) 3.5 - 5.2 g/dL   AST 24  0 - 37 U/L   ALT 17  0 - 35 U/L   Alkaline Phosphatase 76  39 - 117 U/L   Total Bilirubin 0.2 (*) 0.3 - 1.2 mg/dL   GFR calc non Af Amer 86 (*) >90 mL/min   GFR calc Af Amer >90  >90 mL/min   Comment: (NOTE)     The eGFR has been calculated using the CKD EPI equation.     This calculation has not been validated in all clinical situations.     eGFR's persistently <90 mL/min signify possible Chronic Kidney     Disease.  CBC     Status: Abnormal   Collection Time    03/05/13  5:00 AM      Result Value Range   WBC 7.8  4.0 - 10.5 K/uL   Comment: WHITE COUNT CONFIRMED ON SMEAR   RBC 3.21 (*) 3.87 - 5.11 MIL/uL   Hemoglobin 10.4 (*) 12.0 - 15.0 g/dL   HCT 04.5 (*) 40.9 - 81.1 %  MCV 94.4  78.0 - 100.0 fL   MCH 32.4  26.0 - 34.0 pg   MCHC 34.3  30.0 - 36.0 g/dL   RDW 16.1  09.6 - 04.5 %   Platelets 113 (*) 150 - 400 K/uL   Comment: SPECIMEN CHECKED FOR CLOTS     PLATELET COUNT CONFIRMED BY SMEAR  HEMOGLOBIN A1C     Status: None   Collection Time    03/05/13  5:00 AM      Result Value Range   Hemoglobin A1C 5.5  <5.7 %   Comment: (NOTE)                                                                               According to the ADA Clinical Practice Recommendations for 2011, when     HbA1c is used as a screening test:      >=6.5%   Diagnostic of Diabetes Mellitus               (if abnormal result is confirmed)     5.7-6.4%   Increased risk of developing Diabetes Mellitus     References:Diagnosis and Classification of Diabetes  [place],Diabetes     Care,2011,34(Suppl 1):S62-S69 and Standards of Medical Care in             Diabetes - 2011,Diabetes Care,2011,34 (Suppl 1):S11-S61.   Mean Plasma Glucose 111  <117 mg/dL   Comment: Performed at [facility]  VITAMIN B12     Status: None   Collection Time    03/05/13  5:00 AM      Result Value Range   Vitamin B-12 581  211 - 911 pg/mL   Comment: Performed at [facility]  GLUCOSE, CAPILLARY     Status: None   Collection Time    03/05/13  7:39 AM      Result Value Range   Glucose-Capillary 98  70 - 99 mg/dL  GLUCOSE, CAPILLARY     Status: Abnormal   Collection Time    03/05/13 11:44 AM      Result Value Range   Glucose-Capillary 212 (*) 70 - 99 mg/dL  GLUCOSE, CAPILLARY     Status: Abnormal   Collection Time    03/05/13  5:22 PM      Result Value Range   Glucose-Capillary 167 (*) 70 - 99 mg/dL  GLUCOSE, CAPILLARY     Status: Abnormal   Collection Time    03/05/13  7:52 PM      Result Value Range   Glucose-Capillary 145 (*) 70 - 99 mg/dL   Comment 1 Notify RN    GLUCOSE, CAPILLARY     Status: Abnormal   Collection Time    03/06/13  7:43 AM      Result Value Range   Glucose-Capillary 135 (*) 70 - 99 mg/dL    Dg Chest 2 View  40/98/1191   CLINICAL DATA:  Fall, altered mental status.  EXAM: CHEST  2 VIEW  COMPARISON:  Chest radiograph September 14, 2012  FINDINGS: Cardiomediastinal silhouette is unremarkable. Strandy densities in left lung base. The lungs are otherwise clear without pleural effusions or focal consolidations. Pulmonary vasculature is unremarkable. Trachea projects midline  and there is no pneumothorax. Soft tissue planes and included osseous structures are nonsuspicious. Slight cortical irregularity of the lateral 7th rib may reflect remote injury.  IMPRESSION: Left lower lobe atelectasis.   Electronically Signed   By: Awilda Metro   On: 03/04/2013 23:53   Ct Head Wo Contrast  03/05/2013   CLINICAL DATA:  Fall with hip injury   EXAM: CT HEAD WITHOUT CONTRAST  CT CERVICAL SPINE WITHOUT CONTRAST  TECHNIQUE: Multidetector CT imaging of the head and cervical spine was performed following the standard protocol without intravenous contrast. Multiplanar CT image reconstructions of the cervical spine were also generated.  COMPARISON:  08/08/2012 head CT  FINDINGS: CT HEAD FINDINGS  Skull and Sinuses:Four calvarial osteomas, two along the frontal bone and two along the left parietal bone.  Orbits: Bilateral cataract resection.  Brain: No evidence of acute abnormality, such as acute infarction, hemorrhage, hydrocephalus, or mass lesion/mass effect. Stable pattern of patchy bilateral cerebral white matter low density. There is a low-density structure in the right of midline pons, also seen previously (although not seen on subsequent brain MRI).  CT CERVICAL SPINE FINDINGS  Negative for acute fracture. Mild C5-C6 and C7-T1 anterolisthesis can be explained by facet osteoarthritis. No prevertebral edema. No gross cervical canal hematoma. Diffuse degenerative disc disease, especially in the mid and lower cervical spine. No high-grade canal or foraminal stenosis identified. There bilateral thyroid nodules, the larger on the right measuring 13 mm. These are likely incidental based on age. No neighboring adenopathy.  IMPRESSION: 1. No evidence of acute intracranial injury. 2. No evidence of acute cervical spine injury. 3. Stable intracranial chronic small vessel ischemia.   Electronically Signed   By: Tiburcio Pea M.D.   On: 03/05/2013 00:02   Ct Cervical Spine Wo Contrast  03/05/2013   CLINICAL DATA:  Fall with hip injury  EXAM: CT HEAD WITHOUT CONTRAST  CT CERVICAL SPINE WITHOUT CONTRAST  TECHNIQUE: Multidetector CT imaging of the head and cervical spine was performed following the standard protocol without intravenous contrast. Multiplanar CT image reconstructions of the cervical spine were also generated.  COMPARISON:  08/08/2012 head CT   FINDINGS: CT HEAD FINDINGS  Skull and Sinuses:Four calvarial osteomas, two along the frontal bone and two along the left parietal bone.  Orbits: Bilateral cataract resection.  Brain: No evidence of acute abnormality, such as acute infarction, hemorrhage, hydrocephalus, or mass lesion/mass effect. Stable pattern of patchy bilateral cerebral white matter low density. There is a low-density structure in the right of midline pons, also seen previously (although not seen on subsequent brain MRI).  CT CERVICAL SPINE FINDINGS  Negative for acute fracture. Mild C5-C6 and C7-T1 anterolisthesis can be explained by facet osteoarthritis. No prevertebral edema. No gross cervical canal hematoma. Diffuse degenerative disc disease, especially in the mid and lower cervical spine. No high-grade canal or foraminal stenosis identified. There bilateral thyroid nodules, the larger on the right measuring 13 mm. These are likely incidental based on age. No neighboring adenopathy.  IMPRESSION: 1. No evidence of acute intracranial injury. 2. No evidence of acute cervical spine injury. 3. Stable intracranial chronic small vessel ischemia.   Electronically Signed   By: Tiburcio Pea M.D.   On: 03/05/2013 00:02   Mr Lumbar Spine W Wo Contrast  03/05/2013   CLINICAL DATA:  Fall with new onset of bilateral lower extremity weakness. Previous lumbar spine surgery 10/2012.  EXAM: MRI LUMBAR SPINE WITHOUT AND WITH CONTRAST  TECHNIQUE: Multiplanar and multiecho pulse sequences of  the lumbar spine were obtained without and with intravenous contrast.  CONTRAST:  15mL MULTIHANCE GADOBENATE DIMEGLUMINE 529 MG/ML IV SOLN  COMPARISON:  MRI lumbar spine without and with contrast 01/06/2013.  FINDINGS: Normal signal is present in the conus medullaris which terminates at L1, within normal limits. Slight degenerative anterolisthesis at L2-3 and L3-4 is stable. Straightening of the normal cervical lordosis is also stable. And a 1.7 cm right renal lesion is  stable. Vertebral body heights are stable. Endplate degenerative changes at L4-5 are noted. No acute fracture is evident.  T12-L1:  Mild disc bulging is present without significant stenosis.  L1-2: Mild disc bulging and facet hypertrophy is stable. There is no significant stenosis.  L2-3: A far left lateral disc protrusion is evident. Mild left foraminal stenosis is stable.  L3-4: A broad-based disc herniation is again noted. A far right lateral disc protrusion and asymmetric right-sided facet hypertrophy contributes to stable moderate right foraminal stenosis. Mild left foraminal narrowing is unchanged.  L4-5: A leftward disc herniation is stable. Mild foraminal narrowing is unchanged, slightly worse on the left.  L5-S1: A rightward disc herniation is present. Mild right foraminal narrowing is unchanged. The patient is status post right laminotomy without evidence for residual lateral recess stenosis.  IMPRESSION: 1. 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 2. Postsurgical changes on the right at L5-S1 without evidence for residual or recurrent stenosis.   Electronically Signed   By: Gennette Pac M.D.   On: 03/05/2013 15:13    Felicie Morn PA-C [facility] [phone]  03/06/2013, 10:59 AM  Patient seen and examined.  Clinical course and management discussed.  Necessary edits performed.  I agree with the above.  Assessment and plan of care developed and discussed below.    Assessment/Plan: 72 year old female with lower extremity weakness and cognitive issues.  Husband was spoken to at length and it dose not seem that any of these issues started in the recent past.  He has been helping the patient with ambulation for the past 3 years or so and describes significant balance issues.  Cognitive issues have been present for at least that long as well.  Patient also complains of proximal leg pain which has been present for quite some time as well. No significant leg weakness noted.    A1c has been normal.  TSH, B12 normal also.  Head CT reviewed and shows evidence of significant small vessel disease.   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  This is likely the etiology of her gait dysfunction and cognitive issues.  Her pain may actually be related to her diabetes.  Unfortunately there is no specific treatment for these issues.  Patient on quite a few medications that may affect cognition nut these seem to have been longstanding and I am not clear if she could function without any of them  I would leave this to her outpatient physicians.    Recommendations: 1.  PT/OT with CIR consult 2.  Consider Aricept initiation  Thana Farr, MD [facility] [phone]  03/06/2013  7:32 PM

## 2013-03-06 NOTE — Evaluation (Signed)
Occupational Therapy Evaluation Patient Details Name: Valerie Sullivan MRN: [id number] DOB: [birth year] Today's Date: 03/06/2013 Time: 7829-5621 OT Time Calculation (min): 23 min  OT Assessment / Plan / Recommendation History of present illness 72 yo female admitted with fall, progressive leg weakness. hx of back surgery 09/2012, dementia   Clinical Impression    Pt presents to OT with decreased I with ADL activity and will benefit from skilled OT to increase I with ADL activity and return to PLOF  OT Assessment  Patient needs continued OT Services    Follow Up Recommendations  Home health OT;SNF;Supervision/Assistance - 24 hour;Other (comment) (depending on A at home)       Equipment Recommendations  3 in 1 bedside comode       Frequency  Min 2X/week    Precautions / Restrictions Precautions Precautions: Fall Restrictions Weight Bearing Restrictions: No       ADL  Grooming: Minimal assistance Where Assessed - Grooming: Unsupported sitting Upper Body Bathing: Minimal assistance Where Assessed - Upper Body Bathing: Unsupported sitting Lower Body Bathing: Maximal assistance Where Assessed - Lower Body Bathing: Supported sit to stand Upper Body Dressing: Minimal assistance Where Assessed - Upper Body Dressing: Unsupported sitting Lower Body Dressing: Maximal assistance Where Assessed - Lower Body Dressing: Supported sit to Pharmacist, hospital: Moderate assistance Toilet Transfer Method: Sit to stand;Stand pivot;Other (comment) (bed to chair) Toileting - Clothing Manipulation and Hygiene: Moderate assistance Where Assessed - Toileting Clothing Manipulation and Hygiene: Standing ADL Comments: Pt seemed to need more A this visit. Pt was anxiious and tearful over husband being gone    OT Diagnosis: Generalized weakness  OT Problem List: Decreased activity tolerance;Decreased strength OT Treatment Interventions:     OT Goals(Current goals can be found in the care plan  section) Acute Rehab OT Goals Patient Stated Goal: home.  OT Goal Formulation: With patient Time For Goal Achievement: 03/20/13 Potential to Achieve Goals: Good  Visit Information  Last OT Received On: 03/06/13 Assistance Needed: +1 History of Present Illness: 72 yo female admitted with fall, progressive leg weakness. hx of back surgery 09/2012, dementia       Prior Functioning     Home Living Family/patient expects to be discharged to:: Private residence Living Arrangements: Spouse/significant other Type of Home: House Home Access: Stairs to enter Entrance Stairs-Rails: None Home Layout: One level Home Equipment: Environmental consultant - 4 wheels Prior Function Level of Independence: Needs assistance Communication Communication: No difficulties         Vision/Perception Vision - History Patient Visual Report: No change from baseline   Cognition  Cognition Arousal/Alertness: Awake/alert Behavior During Therapy: Anxious (pt crying and emotional as husband had gone to run errands) Overall Cognitive Status: Within Functional Limits for tasks assessed    Extremity/Trunk Assessment Upper Extremity Assessment Upper Extremity Assessment: Overall WFL for tasks assessed Lower Extremity Assessment Lower Extremity Assessment: Generalized weakness Cervical / Trunk Assessment Cervical / Trunk Assessment: Normal     Mobility Bed Mobility Bed Mobility: Supine to Sit Supine to Sit: 4: Min assist;With rails;HOB elevated Details for Bed Mobility Assistance: Increased time. assist for trunk stabilization.  Transfers Sit to Stand: 4: Min assist;From bed Stand to Sit: 4: Min assist;To chair/3-in-1 Details for Transfer Assistance: Assist to rise, stabilize, control descent. VCs safety, technique, hand placement           End of Session OT - End of Session Activity Tolerance: Patient tolerated treatment well Patient left: in bed;with call bell/phone within reach;with bed alarm  set Nurse  Communication: Mobility status  GO     Alba Cory 03/06/2013, 3:24 PM

## 2013-03-06 NOTE — Care Management Utilization Note (Signed)
UR complete    Clarkson Rosselli,MSN,RN [phone] 

## 2013-03-06 NOTE — Progress Notes (Signed)
Patient a bit teary eyed, misses her spouse.  Discussed their history and how they met.  We discussed the possible need for her to go to a facility for rehab services, so she could get stronger and return home with her husband.  She asked questions and verbalized her understanding as to this need so she could be of some help at home and care more for herself.'

## 2013-03-06 NOTE — Progress Notes (Signed)
Pt report missing a furr coat and and a hat when she was transfered from the ED. Notified the staff in the ED and nursing supervisor. All attempt has been unsuccessful to locate the patient's belongings So far. Family and patient notified, oncoming nurse made aware of the situation and the search for the pt  Belongings.

## 2013-03-06 NOTE — Evaluation (Signed)
Physical Therapy Evaluation Patient Details Name: Valerie Sullivan MRN: [id number] DOB: April 20, 1940 Today's Date: 03/06/2013 Time: 2841-3244 PT Time Calculation (min): 21 min  PT Assessment / Plan / Recommendation History of Present Illness  72 yo female admitted with fall, progressive leg weakness. hx of back surgery 09/2012, dementia  Clinical Impression  On eval, pt required Min assist for mobility-able to ambulate ~100 feet with walker. Demonstrates general weakness, decreased activity tolerance, and impaired gait and balance. Reports pain R lower back and down R leg. Recommend ST rehab at SNF if pt/husband will agree. Husband is experiencing back issues and states he is scheduled for back surgery in the near future. Currently pt requires Min assist for safe mobility and not sure if husband and pt can safely manage at home. Pt states she does not want to be "put away" and that she plans to d/c home with husband. If SNF is refused, recommend HHPT with 24 hour supervision/assist and home health aide if possible.     PT Assessment  Patient needs continued PT services    Follow Up Recommendations  SNF (if pt/husband agreeable)    Does the patient have the potential to tolerate intense rehabilitation      Barriers to Discharge        Equipment Recommendations  Rolling walker with 5" wheels    Recommendations for Other Services OT consult   Frequency Min 3X/week    Precautions / Restrictions Precautions Precautions: Fall Restrictions Weight Bearing Restrictions: No   Pertinent Vitals/Pain R lower back, R leg, lower abdomen-pt unable to rate using scale      Mobility  Bed Mobility Bed Mobility: Supine to Sit Supine to Sit: 4: Min assist;With rails;HOB elevated Details for Bed Mobility Assistance: Increased time. assist for trunk stabilization.  Transfers Transfers: Sit to Stand;Stand to Sit Sit to Stand: 4: Min assist;From bed Stand to Sit: 4: Min assist;To  chair/3-in-1 Details for Transfer Assistance: Assist to rise, stabilize, control descent. VCs safety, technique, hand placement Ambulation/Gait Ambulation/Gait Assistance: 4: Min assist Ambulation Distance (Feet): 100 Feet Assistive device: Rolling walker Ambulation/Gait Assistance Details: Assist to stabilize throughout ambulation.  Gait Pattern: Step-through pattern;Trunk flexed;Decreased stride length;Wide base of support    Exercises     PT Diagnosis: Difficulty walking;Abnormality of gait;Generalized weakness;Acute pain  PT Problem List: Decreased strength;Decreased activity tolerance;Decreased balance;Decreased mobility;Decreased safety awareness;Decreased cognition;Decreased knowledge of use of DME;Pain PT Treatment Interventions: DME instruction;Gait training;Functional mobility training;Therapeutic activities;Therapeutic exercise;Balance training;Patient/family education     PT Goals(Current goals can be found in the care plan section) Acute Rehab PT Goals Patient Stated Goal: home.  PT Goal Formulation: With patient/family Time For Goal Achievement: 03/20/13 Potential to Achieve Goals: Good  Visit Information  Last PT Received On: 03/06/13 Assistance Needed: +1 History of Present Illness: 72 yo female admitted with fall, progressive leg weakness. hx of back surgery 09/2012, dementia       Prior Functioning  Home Living Family/patient expects to be discharged to:: Private residence Living Arrangements: Spouse/significant other Type of Home: House Home Access: Stairs to enter Entrance Stairs-Rails: None Home Layout: One level Home Equipment: Environmental consultant - 4 wheels Prior Function Level of Independence: Needs assistance Communication Communication: No difficulties    Cognition  Cognition Arousal/Alertness: Awake/alert Behavior During Therapy: WFL for tasks assessed/performed Overall Cognitive Status: Within Functional Limits for tasks assessed    Extremity/Trunk  Assessment Upper Extremity Assessment Upper Extremity Assessment: Overall WFL for tasks assessed Lower Extremity Assessment Lower Extremity Assessment: Generalized weakness Cervical /  Trunk Assessment Cervical / Trunk Assessment: Normal   Balance    End of Session PT - End of Session Equipment Utilized During Treatment: Gait belt Activity Tolerance: Patient tolerated treatment well Patient left: in chair;with call bell/phone within reach;with family/visitor present Nurse Communication: Mobility status  GP     Rebeca Alert, MPT Pager: [phone]

## 2013-03-06 NOTE — Progress Notes (Addendum)
[facility] PROGRESS NOTE  NAYZETH ALTMAN ZOX:[id number] DOB: Dec 23, 1940 DOA: 03/04/2013 PCP: Jeanann Lewandowsky, MD  Assessment/Plan:  1. Progressive lower extremity weakness/frequent falls -since September -MRI LS Spine with multilevel spondylosis of lumbar spine -I called d/w Neurosurgery Dr.Stern covering for Dr.Cram, reviewed MRI LS Spine, couldn't explain symptoms based on this, will request Neuro consult -prior Right L5-S1 laminectomy and discectomy with microscopic dissection per Dr.Cram in 7/14 -PT eval pending, needs to be completed today  2. . Diabetes mellitus  -stable, SSI  -Hbaic 5.5  3. HTN (hypertension)  -BP uncontrolled  -increase diovan, PRN hydralazine   4. Right kidney mass  -followed by Dr.Nesi with surveillance at this time   5. UTI  -IV Rocephin  -FU urine Cx  6. Dementia   Prophylaxis: SCD, Protonix   Code Status: Full Code Family Communication: d/w Husband at bedside Disposition Plan: to be deterimined   Consultants:  D/w Dr.Cram  HPI/Subjective: Feels ok, legs weak, some urinary symptoms  Objective: Filed Vitals:   03/06/13 0528  BP: 170/98  Pulse: 71  Temp: 97.9 F (36.6 C)  Resp: 18    Intake/Output Summary (Last 24 hours) at 03/06/13 8119 Last data filed at 03/06/13 0259  Gross per 24 hour  Intake   1063 ml  Output    300 ml  Net    763 ml   Filed Weights   03/05/13 0419  Weight: 71.668 kg (158 lb)    Exam:   General:  Alert, awake, oriented to self, place  Cardiovascular: S1S2/RRR  Respiratory: CTAB  Abdomen:soft, NT, BS present  Musculoskeletal: no edema c/c  NEuro: lower ext strength 3/5, sensory light touch intact  Reflexes-Knee Diminished on R, 1plus on L  Plantars withdrawal  Data Reviewed: Basic Metabolic Panel:  Recent Labs Lab 03/04/13 2310 03/05/13 0500  NA 144 141  K 3.6 2.8*  CL 107 107  CO2 31 29  GLUCOSE 136* 131*  BUN 12 9  CREATININE 0.92 0.67  CALCIUM 9.1 8.6  MG   --  1.7  PHOS  --  2.0*   Liver Function Tests:  Recent Labs Lab 03/04/13 2310 03/05/13 0500  AST 26 24  ALT 20 17  ALKPHOS 85 76  BILITOT 0.3 0.2*  PROT 6.0 5.5*  ALBUMIN 3.1* 2.8*   No results found for this basename: LIPASE, AMYLASE,  in the last 168 hours No results found for this basename: AMMONIA,  in the last 168 hours CBC:  Recent Labs Lab 03/04/13 2310 03/05/13 0500  WBC 7.6 7.8  NEUTROABS 5.1  --   HGB 11.3* 10.4*  HCT 32.8* 30.3*  MCV 94.8 94.4  PLT 123* 113*   Cardiac Enzymes:  Recent Labs Lab 03/04/13 2350  CKTOTAL 396*   BNP (last 3 results) No results found for this basename: PROBNP,  in the last 8760 hours CBG:  Recent Labs Lab 03/05/13 0739 03/05/13 1144 03/05/13 1722 03/05/13 1952 03/06/13 0743  GLUCAP 98 212* 167* 145* 135*    No results found for this or any previous visit (from the past 240 hour(s)).   Studies: Dg Chest 2 View  03/04/2013   CLINICAL DATA:  Fall, altered mental status.  EXAM: CHEST  2 VIEW  COMPARISON:  Chest radiograph September 14, 2012  FINDINGS: Cardiomediastinal silhouette is unremarkable. Strandy densities in left lung base. The lungs are otherwise clear without pleural effusions or focal consolidations. Pulmonary vasculature is unremarkable. Trachea projects midline and there is no pneumothorax. Soft tissue planes  and included osseous structures are nonsuspicious. Slight cortical irregularity of the lateral 7th rib may reflect remote injury.  IMPRESSION: Left lower lobe atelectasis.   Electronically Signed   By: Awilda Metro   On: 03/04/2013 23:53   Ct Head Wo Contrast  03/05/2013   CLINICAL DATA:  Fall with hip injury  EXAM: CT HEAD WITHOUT CONTRAST  CT CERVICAL SPINE WITHOUT CONTRAST  TECHNIQUE: Multidetector CT imaging of the head and cervical spine was performed following the standard protocol without intravenous contrast. Multiplanar CT image reconstructions of the cervical spine were also generated.   COMPARISON:  08/08/2012 head CT  FINDINGS: CT HEAD FINDINGS  Skull and Sinuses:Four calvarial osteomas, two along the frontal bone and two along the left parietal bone.  Orbits: Bilateral cataract resection.  Brain: No evidence of acute abnormality, such as acute infarction, hemorrhage, hydrocephalus, or mass lesion/mass effect. Stable pattern of patchy bilateral cerebral white matter low density. There is a low-density structure in the right of midline pons, also seen previously (although not seen on subsequent brain MRI).  CT CERVICAL SPINE FINDINGS  Negative for acute fracture. Mild C5-C6 and C7-T1 anterolisthesis can be explained by facet osteoarthritis. No prevertebral edema. No gross cervical canal hematoma. Diffuse degenerative disc disease, especially in the mid and lower cervical spine. No high-grade canal or foraminal stenosis identified. There bilateral thyroid nodules, the larger on the right measuring 13 mm. These are likely incidental based on age. No neighboring adenopathy.  IMPRESSION: 1. No evidence of acute intracranial injury. 2. No evidence of acute cervical spine injury. 3. Stable intracranial chronic small vessel ischemia.   Electronically Signed   By: Tiburcio Pea M.D.   On: 03/05/2013 00:02   Ct Cervical Spine Wo Contrast  03/05/2013   CLINICAL DATA:  Fall with hip injury  EXAM: CT HEAD WITHOUT CONTRAST  CT CERVICAL SPINE WITHOUT CONTRAST  TECHNIQUE: Multidetector CT imaging of the head and cervical spine was performed following the standard protocol without intravenous contrast. Multiplanar CT image reconstructions of the cervical spine were also generated.  COMPARISON:  08/08/2012 head CT  FINDINGS: CT HEAD FINDINGS  Skull and Sinuses:Four calvarial osteomas, two along the frontal bone and two along the left parietal bone.  Orbits: Bilateral cataract resection.  Brain: No evidence of acute abnormality, such as acute infarction, hemorrhage, hydrocephalus, or mass lesion/mass effect.  Stable pattern of patchy bilateral cerebral white matter low density. There is a low-density structure in the right of midline pons, also seen previously (although not seen on subsequent brain MRI).  CT CERVICAL SPINE FINDINGS  Negative for acute fracture. Mild C5-C6 and C7-T1 anterolisthesis can be explained by facet osteoarthritis. No prevertebral edema. No gross cervical canal hematoma. Diffuse degenerative disc disease, especially in the mid and lower cervical spine. No high-grade canal or foraminal stenosis identified. There bilateral thyroid nodules, the larger on the right measuring 13 mm. These are likely incidental based on age. No neighboring adenopathy.  IMPRESSION: 1. No evidence of acute intracranial injury. 2. No evidence of acute cervical spine injury. 3. Stable intracranial chronic small vessel ischemia.   Electronically Signed   By: Tiburcio Pea M.D.   On: 03/05/2013 00:02   Mr Lumbar Spine W Wo Contrast  03/05/2013   CLINICAL DATA:  Fall with new onset of bilateral lower extremity weakness. Previous lumbar spine surgery 10/2012.  EXAM: MRI LUMBAR SPINE WITHOUT AND WITH CONTRAST  TECHNIQUE: Multiplanar and multiecho pulse sequences of the lumbar spine were obtained without and with  intravenous contrast.  CONTRAST:  15mL MULTIHANCE GADOBENATE DIMEGLUMINE 529 MG/ML IV SOLN  COMPARISON:  MRI lumbar spine without and with contrast 01/06/2013.  FINDINGS: Normal signal is present in the conus medullaris which terminates at L1, within normal limits. Slight degenerative anterolisthesis at L2-3 and L3-4 is stable. Straightening of the normal cervical lordosis is also stable. And a 1.7 cm right renal lesion is stable. Vertebral body heights are stable. Endplate degenerative changes at L4-5 are noted. No acute fracture is evident.  T12-L1:  Mild disc bulging is present without significant stenosis.  L1-2: Mild disc bulging and facet hypertrophy is stable. There is no significant stenosis.  L2-3: A far  left lateral disc protrusion is evident. Mild left foraminal stenosis is stable.  L3-4: A broad-based disc herniation is again noted. A far right lateral disc protrusion and asymmetric right-sided facet hypertrophy contributes to stable moderate right foraminal stenosis. Mild left foraminal narrowing is unchanged.  L4-5: A leftward disc herniation is stable. Mild foraminal narrowing is unchanged, slightly worse on the left.  L5-S1: A rightward disc herniation is present. Mild right foraminal narrowing is unchanged. The patient is status post right laminotomy without evidence for residual lateral recess stenosis.  IMPRESSION: 1. 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 2. Postsurgical changes on the right at L5-S1 without evidence for residual or recurrent stenosis.   Electronically Signed   By: Gennette Pac M.D.   On: 03/05/2013 15:13    Scheduled Meds: . aspirin  81 mg Oral q morning - 10a  . atorvastatin  20 mg Oral QHS  . citalopram  20 mg Oral q morning - 10a  . docusate sodium  100 mg Oral BID  . irbesartan  150 mg Oral Daily   And  . hydrochlorothiazide  12.5 mg Oral Daily  . insulin aspart  0-5 Units Subcutaneous QHS  . insulin aspart  0-9 Units Subcutaneous TID WC  . pantoprazole  40 mg Oral Daily  . polyethylene glycol  17 g Oral Daily  . sodium chloride  3 mL Intravenous Q12H  . traZODone  100 mg Oral QHS   Continuous Infusions: . sodium chloride 20 mL/hr at 03/05/13 8657    Active Problems:   Diabetes mellitus   Fall   HTN (hypertension)   Abnormal gait   Debility   Altered mental status   Right kidney mass   UTI (lower urinary tract infection)    Time spent:    [facility]  [facility] Pager [phone]. If 7PM-7AM, please contact night-coverage at [email or web address], password [facility] 03/06/2013, 8:33 AM  LOS: 2 days

## 2013-03-06 NOTE — Care Management Note (Signed)
   CARE MANAGEMENT NOTE 03/06/2013  Patient:  Valerie Sullivan, Valerie Sullivan   Account Number:  [id number]  Date Initiated:  03/06/2013  Documentation initiated by:  Kalianne Fetting  Subjective/Objective Assessment:   72 yo female admitted s/p fall, UTI, and trouble ambulating.     Action/Plan:   Home with HH services vs SNF   Anticipated DC Date:     Anticipated DC Plan:  HOME W HOME HEALTH SERVICES  In-house referral  Clinical Social Worker      DC Planning Services  CM consult      Choice offered to / List presented to:  NA   DME arranged  NA      DME agency  NA     HH arranged  NA      HH agency  NA   Status of service:  In process, will continue to follow Medicare Important Message given?   (If response is "NO", the following Medicare IM given date fields will be blank) Date Medicare IM given:   Date Additional Medicare IM given:    Discharge Disposition:    Per UR Regulation:  Reviewed for med. necessity/level of care/duration of stay  If discussed at Long Length of Stay Meetings, dates discussed:    Comments:  03/06/13 1225 Hang Ammon,MSN,RN [phone] CM spoke with patient and spouse at bedside concerning discharge planning. PTA spouse states pt's daily caretaker. spouse states pt previously active with [facility] for HHPT but per spouse unhappy with service.Cm provided pt and spouse with choice list for [facility]. Pt states unable to ambulate at this time.Awating Pt eval.

## 2013-03-06 NOTE — Progress Notes (Signed)
Informed patients spouse that we had discussed the need for possible short term rehab placement and that she verbalized her agreement.

## 2013-03-07 DIAGNOSIS — F0390 Unspecified dementia without behavioral disturbance: Secondary | ICD-10-CM | POA: Diagnosis present

## 2013-03-07 LAB — GLUCOSE, CAPILLARY
Glucose-Capillary: 165 mg/dL — ABNORMAL HIGH (ref 70–99)
Glucose-Capillary: 141 mg/dL — ABNORMAL HIGH (ref 70–99)
Glucose-Capillary: 139 mg/dL — ABNORMAL HIGH (ref 70–99)

## 2013-03-07 LAB — BASIC METABOLIC PANEL
BUN: 11 mg/dL (ref 6–23)
Chloride: 103 mEq/L (ref 96–112)
Creatinine, Ser: 0.72 mg/dL (ref 0.50–1.10)
GFR calc Af Amer: >90 mL/min (ref >90)
GFR calc non Af Amer: 84 mL/min — ABNORMAL LOW (ref >90)
Glucose, Bld: 133 mg/dL — ABNORMAL HIGH (ref 70–99)

## 2013-03-07 MED ORDER — ACETAMINOPHEN 325 MG PO TABS: 650.0000 mg | ORAL_TABLET | Freq: Four times a day (QID) | ORAL | Status: DC | PRN

## 2013-03-07 MED ORDER — POTASSIUM CHLORIDE CRYS ER 20 MEQ PO TBCR: 40.0000 meq | EXTENDED_RELEASE_TABLET | Freq: Every day | ORAL | Status: DC

## 2013-03-07 MED ORDER — DIAZEPAM 5 MG PO TABS: 5.0000 mg | ORAL_TABLET | Freq: Every evening | ORAL | Status: DC | PRN

## 2013-03-07 MED ORDER — OXYCODONE HCL 5 MG PO TABS: 5.0000 mg | ORAL_TABLET | Freq: Three times a day (TID) | ORAL | Status: DC | PRN

## 2013-03-07 NOTE — Progress Notes (Signed)
Physical medicine and rehabilitation consult requested with chart reviewed. Patient with recent L5-S1 laminectomy in July with steady decline in cognition and mobility. Husband has been assisting with feeding her as well as toileting. Husband has chronic back pain with plan for upcoming surgery and could not provide care at home. Physical and occupational therapy evaluations completed 03/06/2013. Patient was minimal assist ambulate 100 feet with initial evaluation using a walker. Recommendations are for home versus skilled nursing facility secondary to comes chronic back pain upcoming need for surgery. Will hold on formal rehabilitation consult at this time with recommendations for skilled nursing facility

## 2013-03-07 NOTE — Progress Notes (Addendum)
CSW called patient's husband back. He is agreeable to [facility]. Notified [facility] of same.  Bethany C. Corcoran MSW, LCSW [phone]   Current barrier to discharge is that pt does not yet have pasarr and pasarr website, [place] is currently down. Per [place] [facility], [facility] is working on restoring website. CSW will continue to check progress of restoration of website in order to obtain pasarr.   Addendum 2:30 pm:  CSW met with pt and pt husband at bedside. CSW provided emotional support to pt husband as pt husband felt very rushed this morning about pt discharge plans and did not feel discharge planning was handled appropriately.   CSW discussed with pt husband that [facility] coordinator will be back to hospital approximately around 3 pm in order to complete paperwork with pt and pt husband.  CSW discussed with pt husband about the situation regarding the pasarr and that the hospital cannot discharge patient until pasarr received. The [place] MUST website remains down at the time of this writing.   Pt husband requested assistance with locating some phone numbers of some places that he needed to call and CSW assisted with this.   CSW will follow up with pt and pt husband re: discharge planning. Barrier to discharge is currently [place] [facility] website being down and inability to obtain pasarr at this time.   Addendum 4:30 pm:  [facility] unable to accept pt today due to no pasarr. CSW to facilitate pt discharge needs tomorrow.   Jacklynn Lewis, MSW, LCSW Clinical Social Work Coverage for [employer], [place]

## 2013-03-07 NOTE — Discharge Summary (Addendum)
Physician Discharge Summary  Valerie Sullivan:[id number] DOB: Sep 22, 1940 DOA: 03/04/2013  PCP: Jeanann Lewandowsky, MD  Admit date: 03/04/2013 Discharge date: 03/07/2013  Time spent: 45 minutes  Recommendations for Outpatient Follow-up:  1. PCP in 1 week  Discharge Diagnoses:    Progressive/chronic leg weakness   Mild dementia   Diabetes mellitus   Fall   HTN (hypertension)   Abnormal gait   Debility   Altered mental status   Right kidney mass   Discharge Condition: stable  Diet recommendation:low sodium  Filed Weights   03/05/13 0419  Weight: 71.668 kg (158 lb)    History of present illness:  HPI:  Valerie Sullivan is a 72 y.o. female  has a past medical history of Constipation; Pelvic adhesions; Diabetes mellitus; Hypertension; Arthritis; and Dementia.  Presented with fall, Husband went to the store when he returned she was being transported to ER by EMS. He thinks that she must have gotten up and tried to walk. Patient states she got up to use a bathroom and has fallen. Patient had her back operated on in July by Dr. Wynetta Emery. Since then she has had a fall as she was trying to get to the door. Since September she started to have progressively worsening unstable and shuffling gait. She also has bilateral leg weakness that has been progressive. Family also noted hand tremor. Patient has history of dementia. She is unable to provide good history.  The patient was brought in to emergency department initially she appeared to be altered and confused this was in the setting of patient receiving fentanyl in route which apparently oversedated her. Once patient became more alert she was able to provide some her own history.   Hospital Course:  1. Progressive lower extremity weakness/frequent falls and cognitive decline -for years, worsening since september -MRI LS Spine with multilevel spondylosis of lumbar spine  -I called d/w Neurosurgery Dr.Stern covering for Dr.Cram, reviewed MRI  LS Spine, couldn't explain symptoms based on this  -prior Right L5-S1 laminectomy and discectomy with microscopic dissection per Dr.Cram in 7/14  -Seen by Neurology dr.Reynolds and upon further discussion, it was determined that her husband has been helping the patient with ambulation for the past 3 years or so and describes significant balance issues. Cognitive issues have been present for at least that long as well.  - TSH, B12 normal  - Head CT reviewed and shows evidence of significant small vessel disease.  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 This is likely the etiology of her gait dysfunction and cognitive issues. Unfortunately there is no specific treatment for these issues. Patient on quite a few medications that may affect cognition it is not clear if she could function without any of them hence would leave this to her outpatient physicians.  Dr.Reynolds and PT recommended Rehab and hence she is bring sent to SNF for ST rehab  2. . Diabetes mellitus  -stable, SSI  -Hbaic 5.5   3. HTN (hypertension)  -BP uncontrolled initially -increased diovan, stable now  4. Right kidney mass  -followed by Dr.Nesi with surveillance at this time  5. Dementia  -stable  Discharge Exam: Filed Vitals:   03/07/13 0646  BP: 145/88  Pulse: 77  Temp: 98 F (36.7 C)  Resp: 18    General: AAOx3 Cardiovascular: S1S2/RRR Respiratory: CTAB  Discharge Instructions      Discharge Orders   Future Orders Complete By Expires  Diet - low sodium heart healthy  As directed    Diet Carb Modified  As directed    Increase activity slowly  As directed        Medication List    STOP taking these medications       insulin aspart 100 UNIT/ML injection  Commonly known as:  novoLOG     naproxen sodium 220 MG tablet  Commonly known as:  ANAPROX      TAKE these medications       acetaminophen 325  MG tablet  Commonly known as:  TYLENOL  Take 2 tablets (650 mg total) by mouth every 6 (six) hours as needed for mild pain or headache.     aspirin 81 MG tablet  Take 81 mg by mouth every morning.     atorvastatin 20 MG tablet  Commonly known as:  LIPITOR  Take 20 mg by mouth at bedtime.     citalopram 20 MG tablet  Commonly known as:  CELEXA  Take 20 mg by mouth every morning.     diazepam 5 MG tablet  Commonly known as:  VALIUM  Take 1 tablet (5 mg total) by mouth at bedtime as needed for anxiety (or sleep).     omeprazole 20 MG capsule  Commonly known as:  PRILOSEC  Take 20 mg by mouth daily.     oxyCODONE 5 MG immediate release tablet  Commonly known as:  Oxy IR/ROXICODONE  Take 1 tablet (5 mg total) by mouth every 8 (eight) hours as needed. For pain     polyethylene glycol packet  Commonly known as:  MIRALAX / GLYCOLAX  Take 17 g by mouth daily.     potassium chloride SA 20 MEQ tablet  Commonly known as:  K-DUR,KLOR-CON  Take 2 tablets (40 mEq total) by mouth daily.     senna 8.6 MG Tabs tablet  Commonly known as:  SENOKOT  Take 2 tablets by mouth daily as needed for mild constipation. As needed for constipation.     traZODone 100 MG tablet  Commonly known as:  DESYREL  Take 100 mg by mouth at bedtime.     valsartan-hydrochlorothiazide 160-12.5 MG per tablet  Commonly known as:  DIOVAN-HCT  Take 1 tablet by mouth every morning.     Vitamin D (Ergocalciferol) 50000 UNITS Caps capsule  Commonly known as:  DRISDOL  Take 50,000 Units by mouth every 7 (seven) days. Monday       No Known Allergies Follow-up Information   Follow up with Jeanann Lewandowsky, MD. Schedule an appointment as soon as possible for a visit in 1 week.   Specialty:  Internal Medicine   Contact information:   [address] [place] [address] [phone]        The results of significant diagnostics from this hospitalization (including imaging, microbiology, ancillary and  laboratory) are listed below for reference.    Significant Diagnostic Studies: Dg Chest 2 View  03/04/2013   CLINICAL DATA:  Fall, altered mental status.  EXAM: CHEST  2 VIEW  COMPARISON:  Chest radiograph September 14, 2012  FINDINGS: Cardiomediastinal silhouette is unremarkable. Strandy densities in left lung base. The lungs are otherwise clear without pleural effusions or focal consolidations. Pulmonary vasculature is unremarkable. Trachea projects midline and there is no pneumothorax. Soft tissue planes and included osseous structures are nonsuspicious. Slight cortical irregularity of the lateral 7th rib may reflect remote injury.  IMPRESSION: Left lower lobe atelectasis.   Electronically Signed  By: Awilda Metro   On: 03/04/2013 23:53   Ct Head Wo Contrast  03/05/2013   CLINICAL DATA:  Fall with hip injury  EXAM: CT HEAD WITHOUT CONTRAST  CT CERVICAL SPINE WITHOUT CONTRAST  TECHNIQUE: Multidetector CT imaging of the head and cervical spine was performed following the standard protocol without intravenous contrast. Multiplanar CT image reconstructions of the cervical spine were also generated.  COMPARISON:  08/08/2012 head CT  FINDINGS: CT HEAD FINDINGS  Skull and Sinuses:Four calvarial osteomas, two along the frontal bone and two along the left parietal bone.  Orbits: Bilateral cataract resection.  Brain: No evidence of acute abnormality, such as acute infarction, hemorrhage, hydrocephalus, or mass lesion/mass effect. Stable pattern of patchy bilateral cerebral white matter low density. There is a low-density structure in the right of midline pons, also seen previously (although not seen on subsequent brain MRI).  CT CERVICAL SPINE FINDINGS  Negative for acute fracture. Mild C5-C6 and C7-T1 anterolisthesis can be explained by facet osteoarthritis. No prevertebral edema. No gross cervical canal hematoma. Diffuse degenerative disc disease, especially in the mid and lower cervical spine. No high-grade  canal or foraminal stenosis identified. There bilateral thyroid nodules, the larger on the right measuring 13 mm. These are likely incidental based on age. No neighboring adenopathy.  IMPRESSION: 1. No evidence of acute intracranial injury. 2. No evidence of acute cervical spine injury. 3. Stable intracranial chronic small vessel ischemia.   Electronically Signed   By: Tiburcio Pea M.D.   On: 03/05/2013 00:02   Ct Cervical Spine Wo Contrast  03/05/2013   CLINICAL DATA:  Fall with hip injury  EXAM: CT HEAD WITHOUT CONTRAST  CT CERVICAL SPINE WITHOUT CONTRAST  TECHNIQUE: Multidetector CT imaging of the head and cervical spine was performed following the standard protocol without intravenous contrast. Multiplanar CT image reconstructions of the cervical spine were also generated.  COMPARISON:  08/08/2012 head CT  FINDINGS: CT HEAD FINDINGS  Skull and Sinuses:Four calvarial osteomas, two along the frontal bone and two along the left parietal bone.  Orbits: Bilateral cataract resection.  Brain: No evidence of acute abnormality, such as acute infarction, hemorrhage, hydrocephalus, or mass lesion/mass effect. Stable pattern of patchy bilateral cerebral white matter low density. There is a low-density structure in the right of midline pons, also seen previously (although not seen on subsequent brain MRI).  CT CERVICAL SPINE FINDINGS  Negative for acute fracture. Mild C5-C6 and C7-T1 anterolisthesis can be explained by facet osteoarthritis. No prevertebral edema. No gross cervical canal hematoma. Diffuse degenerative disc disease, especially in the mid and lower cervical spine. No high-grade canal or foraminal stenosis identified. There bilateral thyroid nodules, the larger on the right measuring 13 mm. These are likely incidental based on age. No neighboring adenopathy.  IMPRESSION: 1. No evidence of acute intracranial injury. 2. No evidence of acute cervical spine injury. 3. Stable intracranial chronic small vessel  ischemia.   Electronically Signed   By: Tiburcio Pea M.D.   On: 03/05/2013 00:02   Mr Lumbar Spine W Wo Contrast  03/05/2013   CLINICAL DATA:  Fall with new onset of bilateral lower extremity weakness. Previous lumbar spine surgery 10/2012.  EXAM: MRI LUMBAR SPINE WITHOUT AND WITH CONTRAST  TECHNIQUE: Multiplanar and multiecho pulse sequences of the lumbar spine were obtained without and with intravenous contrast.  CONTRAST:  15mL MULTIHANCE GADOBENATE DIMEGLUMINE 529 MG/ML IV SOLN  COMPARISON:  MRI lumbar spine without and with contrast 01/06/2013.  FINDINGS: Normal signal is present  in the conus medullaris which terminates at L1, within normal limits. Slight degenerative anterolisthesis at L2-3 and L3-4 is stable. Straightening of the normal cervical lordosis is also stable. And a 1.7 cm right renal lesion is stable. Vertebral body heights are stable. Endplate degenerative changes at L4-5 are noted. No acute fracture is evident.  T12-L1:  Mild disc bulging is present without significant stenosis.  L1-2: Mild disc bulging and facet hypertrophy is stable. There is no significant stenosis.  L2-3: A far left lateral disc protrusion is evident. Mild left foraminal stenosis is stable.  L3-4: A broad-based disc herniation is again noted. A far right lateral disc protrusion and asymmetric right-sided facet hypertrophy contributes to stable moderate right foraminal stenosis. Mild left foraminal narrowing is unchanged.  L4-5: A leftward disc herniation is stable. Mild foraminal narrowing is unchanged, slightly worse on the left.  L5-S1: A rightward disc herniation is present. Mild right foraminal narrowing is unchanged. The patient is status post right laminotomy without evidence for residual lateral recess stenosis.  IMPRESSION: 1. 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 2. Postsurgical changes on the right at L5-S1 without evidence for residual or recurrent stenosis.   Electronically Signed   By: Gennette Pac M.D.   On: 03/05/2013 15:13   Ct Abdomen Pelvis W Contrast  02/15/2013   CLINICAL DATA:  Left lower quadrant pain  EXAM: CT ABDOMEN AND PELVIS WITH CONTRAST  TECHNIQUE: Multidetector CT imaging of the abdomen and pelvis was performed using the standard protocol following bolus administration of intravenous contrast.  CONTRAST:  OMNIPAQUE IOHEXOL 300 MG/ML  SOLN  COMPARISON:  12/14/2012  FINDINGS: Sagittal images of the spine shows stable degenerative changes lumbar spine. Lung bases are unremarkable. Enhanced liver is unremarkable. Gallbladder is contracted without evidence of calcified gallstones.  The pancreas, spleen and adrenal glands are unremarkable. Kidneys are symmetrical in enhancement. Enhancing lesion in lower pole of the right kidney medial aspect measures 1.5 x 1.7 cm stable in size in appearance from prior exam.  No aortic aneurysm.  Moderate stool in right colon. No ascites or free air. No adenopathy. No pericecal inflammation. Normal appendix.  Mild distended urinary bladder. The patient is status post hysterectomy. No destructive bony lesions are noted within pelvis.  No hydronephrosis or hydroureter. Delayed renal images shows bilateral renal symmetrical excretion. Bilateral visualized proximal ureter is unremarkable.  IMPRESSION: 1. Stable enhancing lesion in lower pole of the right kidney. No hydronephrosis or hydroureter. 2. No small bowel obstruction. 3. Normal appendix. No pericecal inflammation. Moderate stool noted in right colon. 4. Status post hysterectomy.   Electronically Signed   By: Natasha Mead M.D.   On: 02/15/2013 20:55    Microbiology: No results found for this or any previous visit (from the past 240 hour(s)).   Labs: Basic Metabolic Panel:  Recent Labs Lab 03/04/13 2310 03/05/13 0500 03/07/13 0825  NA 144 141 139  K 3.6 2.8* 3.7  CL 107 107 103  CO2 31 29 29   GLUCOSE 136* 131* 133*  BUN 12 9 11   CREATININE 0.92 0.67 0.72  CALCIUM 9.1 8.6 9.7   MG  --  1.7  --   PHOS  --  2.0*  --    Liver Function Tests:  Recent Labs Lab 03/04/13 2310 03/05/13 0500  AST 26 24  ALT 20 17  ALKPHOS 85 76  BILITOT 0.3 0.2*  PROT 6.0 5.5*  ALBUMIN 3.1* 2.8*   No results found for this basename: LIPASE,  AMYLASE,  in the last 168 hours No results found for this basename: AMMONIA,  in the last 168 hours CBC:  Recent Labs Lab 03/04/13 2310 03/05/13 0500  WBC 7.6 7.8  NEUTROABS 5.1  --   HGB 11.3* 10.4*  HCT 32.8* 30.3*  MCV 94.8 94.4  PLT 123* 113*   Cardiac Enzymes:  Recent Labs Lab 03/04/13 2350  CKTOTAL 396*   BNP: BNP (last 3 results) No results found for this basename: PROBNP,  in the last 8760 hours CBG:  Recent Labs Lab 03/06/13 1136 03/06/13 1638 03/06/13 2124 03/07/13 0732 03/07/13 1116  GLUCAP 195* 108* 129* 119* 165*       Signed:  Klaryssa Fauth  [facility] 03/07/2013, 1:19 PM

## 2013-03-07 NOTE — Progress Notes (Signed)
Clinical Social Work Department BRIEF PSYCHOSOCIAL ASSESSMENT 03/07/2013  Patient:  Valerie Sullivan, Valerie Sullivan     Account Number:  [id number]     Admit date:  03/04/2013  Clinical Social Worker:  Hattie Perch  Date/Time:  03/07/2013 12:00 M  Referred by:  Physician  Date Referred:  03/07/2013 Referred for  SNF Placement   Other Referral:   Interview type:  Patient Other interview type:    PSYCHOSOCIAL DATA Living Status:  FAMILY Admitted from facility:   Level of care:   Primary support name:  Valerie Sullivan Primary support relationship to patient:  SPOUSE Degree of support available:   fair    CURRENT CONCERNS Current Concerns  Post-Acute Placement   Other Concerns:    SOCIAL WORK ASSESSMENT / PLAN CSW met with patient. patient is alert and appears oriented and is agreeable to rehab. however, she defers to her husband as Management consultant and she does have a diagnosis of dementia. CSW called patient husband to discuss rehab recommendation. He states that Dr. Thad Ranger said patient would go to [facility]. CSW explained that [facility] denied patient and patient needs snf. CSW attempted to give him bedd offers but he became angry that CSW couldnt give him a recommendation or steer him towards the right facility. CSW explained that it is against policy for CSW to recommend facility. He became increasingly angry saying that CSW is unproductive and unhelpful for not being able to recommend facility. He asked that CSW have doctor call him and maybe shed be able to help him. CSW notified Dr. Jomarie Longs.   Assessment/plan status:   Other assessment/ plan:   Information/referral to community resources:    PATIENT'S/FAMILY'S RESPONSE TO PLAN OF CARE: spouse was angry that CIR is declined and stated "she can't even walk" CSW explained that patient walked 100 feet yesterday. He stated "she can't walk without help." CSW explained that is why the need for rehab has been indicated. He asked that MD call him.

## 2013-03-07 NOTE — Care Management Note (Signed)
Cm spoke with pt at bedside concerning dc planning. Pt inappropriate for IP rehab related PT eval for SNF vs Home with HH. Per CSW pt provided permission to fax out to SNF. Per pt to discuss with spouse. Cm provided pt with Cm contact info in case disposition is home.    Valerie Sullivan Aldean Suddeth,MSN,RN [phone]

## 2013-03-08 LAB — GLUCOSE, CAPILLARY
Glucose-Capillary: 135 mg/dL — ABNORMAL HIGH (ref 70–99)
Glucose-Capillary: 163 mg/dL — ABNORMAL HIGH (ref 70–99)

## 2013-03-08 NOTE — Progress Notes (Signed)
Called report to Marisue Ivan, Charity fundraiser at [employer].  Left number if she had additional questions.

## 2013-03-08 NOTE — Progress Notes (Signed)
[name], female   DOB: 01/06/1941, 72 y.o.   MRN: [id number]  [facility] PROGRESS NOTE  Valerie Sullivan HQI:[id number] DOB: 1940/07/13 DOA: 03/04/2013 PCP: Jeanann Lewandowsky, MD  Brief Narrative:  Pt is 72 yo female with past medical history of Constipation; Pelvic adhesions; Diabetes mellitus; Hypertension; Arthritis; and Dementia.  She presented with fall, Husband went to the store when he returned she was being transported to ER by EMS. He thinks that she must have gotten up and tried to walk. Patient stated she got up to use a bathroom and has fallen. Patient had her back operated on in July by Dr. Wynetta Emery. Since then she has had a fall as she was trying to get to the door.   Please see discharge summary done 03/07/2013. No changes needed. Pt is stable for discharge to SNF when bed available.   Hospital Course:  1. Progressive lower extremity weakness/frequent falls and cognitive decline  - for years, worsening since september  - MRI LS Spine with multilevel spondylosis of lumbar spine  - prior Right L5-S1 laminectomy and discectomy with microscopic dissection per Dr.Cram in 7/14  - Seen by Neurology dr.Reynolds and upon further discussion, it was determined that her husband has been helping the patient with ambulation for the past 3 years or so and describes significant balance issues. Cognitive issues have been present for at least that long as well.  - TSH, B12 normal  - Head CT reviewed and shows evidence of significant small vessel disease.  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 This is likely the etiology of her gait dysfunction and cognitive issues. Unfortunately there is no specific treatment for these issues. Patient on quite a few medications that may affect cognition it is not clear if she could function without any of them hence would leave this to her  outpatient physicians.  Dr.Reynolds and PT recommended Rehab and hence she is being sent to SNF for ST rehab  2. Diabetes mellitus  - stable, SSI  - Hbaic 5.5  3. HTN (hypertension)  - BP uncontrolled initially  - increased diovan, stable now  4. Right kidney mass  - followed by Dr.Nesi with surveillance at this time  5. Dementia  - stable  Code Status: Full  HPI/Subjective: No events overnight.   Objective: Filed Vitals:   03/07/13 0646 03/07/13 1400 03/07/13 2200 03/08/13 0600  BP: 145/88 152/77 151/88 161/88  Pulse: 77 69 76 80  Temp: 98 F (36.7 C) 97.6 F (36.4 C) 98.3 F (36.8 C) 97.8 F (36.6 C)  TempSrc: Oral Oral Oral Oral  Resp: 18 20 18 20   Height:      Weight:      SpO2: 95% 97% 93% 94%    Intake/Output Summary (Last 24 hours) at 03/08/13 0825 Last data filed at 03/08/13 0615  Gross per 24 hour  Intake    240 ml  Output   1300 ml  Net  -1060 ml    Exam:   General:  Pt is not in acute distress  Cardiovascular: Regular rate and rhythm, S1/S2, no murmurs, no rubs, no gallops  Respiratory: Clear to auscultation bilaterally, no wheezing, no crackles, no rhonchi  Abdomen: Soft, non tender, non distended, bowel sounds present, no guarding  Data Reviewed: Basic Metabolic Panel:  Recent Labs Lab 03/04/13 2310 03/05/13 0500 03/07/13 0825  NA 144 141 139  K 3.6 2.8* 3.7  CL 107 107 103  CO2 31 29 29   GLUCOSE 136* 131* 133*  BUN 12 9 11   CREATININE 0.92 0.67 0.72  CALCIUM 9.1 8.6 9.7  MG  --  1.7  --   PHOS  --  2.0*  --    Liver Function Tests:  Recent Labs Lab 03/04/13 2310 03/05/13 0500  AST 26 24  ALT 20 17  ALKPHOS 85 76  BILITOT 0.3 0.2*  PROT 6.0 5.5*  ALBUMIN 3.1* 2.8*   CBC:  Recent Labs Lab 03/04/13 2310 03/05/13 0500  WBC 7.6 7.8  NEUTROABS 5.1  --   HGB 11.3* 10.4*  HCT 32.8* 30.3*  MCV 94.8 94.4  PLT 123* 113*   Cardiac Enzymes:  Recent Labs Lab 03/04/13 2350  CKTOTAL 396*   CBG:  Recent Labs Lab  03/07/13 0732 03/07/13 1116 03/07/13 1622 03/07/13 2158 03/08/13 0736  GLUCAP 119* 165* 141* 139* 135*   Scheduled Meds: . aspirin  81 mg Oral q morning - 10a  . atorvastatin  20 mg Oral QHS  . citalopram  20 mg Oral q morning - 10a  . docusate sodium  100 mg Oral BID  . irbesartan  300 mg Oral Daily   And  . hydrochlorothiazide  12.5 mg Oral Daily  . insulin aspart  0-5 Units Subcutaneous QHS  . insulin aspart  0-9 Units Subcutaneous TID WC  . pantoprazole  40 mg Oral Daily  . polyethylene glycol  17 g Oral Daily  . potassium chloride  40 mEq Oral Daily  . sodium chloride  3 mL Intravenous Q12H  . traZODone  100 mg Oral QHS   Continuous Infusions:  Debbora Presto, MD  [facility] Pager [phone]  If 7PM-7AM, please contact night-coverage [email or web address] Password [email or web address] 03/08/2013, 8:25 AM   LOS: 4 days

## 2013-03-08 NOTE — Progress Notes (Signed)
Clinical Social Work Department CLINICAL SOCIAL WORK PLACEMENT NOTE 03/08/2013  Patient:  Valerie Sullivan, Valerie Sullivan  Account Number:  [id number] Admit date:  03/04/2013  Clinical Social Worker:  Becky Sax, LCSW  Date/time:  03/07/2013 12:00 M  Clinical Social Work is seeking post-discharge placement for this patient at the following level of care:   SKILLED NURSING   (*CSW will update this form in Epic as items are completed)   03/07/2013  Patient/family provided with [facility] Department of Clinical Social Work's list of facilities offering this level of care within the geographic area requested by the patient (or if unable, by the patient's family).  03/07/2013  Patient/family informed of their freedom to choose among providers that offer the needed level of care, that participate in Medicare, Medicaid or managed care program needed by the patient, have an available bed and are willing to accept the patient.  03/07/2013  Patient/family informed of [facility]' ownership interest in [facility], as well as of the fact that they are under no obligation to receive care at this facility.  PASARR submitted to EDS on 03/08/2013 PASARR number received from EDS on 03/08/2013  FL2 transmitted to all facilities in geographic area requested by pt/family on  03/07/2013 FL2 transmitted to all facilities within larger geographic area on   Patient informed that his/her managed care company has contracts with or will negotiate with  certain facilities, including the following:     Patient/family informed of bed offers received:  03/07/2013 Patient chooses bed at [facility] Physician recommends and patient chooses bed at    Patient to be transferred to [facility] on  03/08/2013 Patient to be transferred to facility by [facility]  The following physician request were entered in Epic:   Additional Comments:   Jacklynn Lewis, MSW, LCSW Clinical Social Work [phone]

## 2013-03-08 NOTE — Progress Notes (Signed)
CSW able to obtain pasarr for pt.  CSW to facilitate pt discharge needs this morning.  Jacklynn Lewis, MSW, LCSW Clinical Social Work Coverage for [employer], [place] [phone]
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
## 2013-03-10 ENCOUNTER — Non-Acute Institutional Stay (SKILLED_NURSING_FACILITY): Payer: Medicare Other | Admitting: Adult Health

## 2013-03-10 DIAGNOSIS — F0390 Unspecified dementia without behavioral disturbance: Secondary | ICD-10-CM

## 2013-03-10 DIAGNOSIS — I10 Essential (primary) hypertension: Secondary | ICD-10-CM

## 2013-03-10 DIAGNOSIS — K219 Gastro-esophageal reflux disease without esophagitis: Secondary | ICD-10-CM

## 2013-03-10 DIAGNOSIS — R29898 Other symptoms and signs involving the musculoskeletal system: Secondary | ICD-10-CM

## 2013-03-10 DIAGNOSIS — F329 Major depressive disorder, single episode, unspecified: Secondary | ICD-10-CM

## 2013-03-10 DIAGNOSIS — G4700 Insomnia, unspecified: Secondary | ICD-10-CM

## 2013-03-10 DIAGNOSIS — E119 Type 2 diabetes mellitus without complications: Secondary | ICD-10-CM

## 2013-03-10 DIAGNOSIS — E785 Hyperlipidemia, unspecified: Secondary | ICD-10-CM

## 2013-03-29 ENCOUNTER — Non-Acute Institutional Stay (SKILLED_NURSING_FACILITY): Payer: Medicare Other | Admitting: Adult Health

## 2013-03-29 DIAGNOSIS — F329 Major depressive disorder, single episode, unspecified: Secondary | ICD-10-CM

## 2013-03-29 DIAGNOSIS — F0390 Unspecified dementia without behavioral disturbance: Secondary | ICD-10-CM

## 2013-03-29 DIAGNOSIS — F32A Depression, unspecified: Secondary | ICD-10-CM

## 2013-03-29 DIAGNOSIS — K219 Gastro-esophageal reflux disease without esophagitis: Secondary | ICD-10-CM

## 2013-03-29 DIAGNOSIS — F3289 Other specified depressive episodes: Secondary | ICD-10-CM

## 2013-03-29 DIAGNOSIS — E119 Type 2 diabetes mellitus without complications: Secondary | ICD-10-CM

## 2013-03-29 DIAGNOSIS — E785 Hyperlipidemia, unspecified: Secondary | ICD-10-CM

## 2013-03-29 DIAGNOSIS — G4700 Insomnia, unspecified: Secondary | ICD-10-CM

## 2013-03-29 DIAGNOSIS — R29898 Other symptoms and signs involving the musculoskeletal system: Secondary | ICD-10-CM

## 2013-03-29 DIAGNOSIS — I10 Essential (primary) hypertension: Secondary | ICD-10-CM

## 2013-03-31 DIAGNOSIS — F32A Depression, unspecified: Secondary | ICD-10-CM | POA: Insufficient documentation

## 2013-03-31 DIAGNOSIS — G4700 Insomnia, unspecified: Secondary | ICD-10-CM | POA: Insufficient documentation

## 2013-03-31 DIAGNOSIS — K219 Gastro-esophageal reflux disease without esophagitis: Secondary | ICD-10-CM | POA: Insufficient documentation

## 2013-03-31 DIAGNOSIS — R29898 Other symptoms and signs involving the musculoskeletal system: Secondary | ICD-10-CM | POA: Insufficient documentation

## 2013-03-31 DIAGNOSIS — F329 Major depressive disorder, single episode, unspecified: Secondary | ICD-10-CM | POA: Insufficient documentation

## 2013-03-31 NOTE — Progress Notes (Signed)
[name], female   DOB: April 17, 1940, 73 y.o.   MRN: [id number]               PROGRESS NOTE  DATE: 03/10/2013  FACILITY: Nursing Home Location: [facility]  LEVEL OF CARE: SNF (31)  Acute Visit  CHIEF COMPLAINT:  Follow-up hospitalization  HISTORY OF PRESENT ILLNESS: This is a 73 year old female who has been admitted to [facility] on 03/08/13 from [facility] with progressive leg weakness. She has been admitted for a short-term rehabilitation.  REASSESSMENT OF ONGOING PROBLEM(S):  HTN: Pt 's HTN remains stable.  Denies CP, sob, DOE, pedal edema, headaches, dizziness or visual disturbances.  No complications from the medications currently being used.  Last BP :  106/59  DEPRESSION: The depression remains stable. Patient denies ongoing feelings of sadness, insomnia, anedhonia or lack of appetite. No complications reported from the medications currently being used. Staff do not report behavioral problems.  DM:pt's DM remains stable.  Pt denies polyuria, polydipsia, polyphagia, changes in vision or hypoglycemic episodes.  No complications noted from the medication presently being used.   12/14 hemoglobin A1c 5.5   PAST MEDICAL HISTORY : Reviewed.  No changes.  CURRENT MEDICATIONS: Reviewed per [facility]  REVIEW OF SYSTEMS:  GENERAL: no change in appetite, no fatigue, no weight changes, no fever, chills or weakness RESPIRATORY: no cough, SOB, DOE, wheezing, hemoptysis CARDIAC: no chest pain, edema or palpitations GI: no abdominal pain, diarrhea, constipation, heart burn, nausea or vomiting  PHYSICAL EXAMINATION  VS:  T97.4       P68      RR20      BP106/59     POX97 %     WT171.2 (Lb)  GENERAL: no acute distress, normal body habitus EYES: conjunctivae normal, sclerae normal, normal eye lids NECK: supple, trachea midline, no neck masses, no thyroid tenderness, no thyromegaly LYMPHATICS: no LAN in the neck, no supraclavicular  LAN RESPIRATORY: breathing is even & unlabored, BS CTAB CARDIAC: RRR, no murmur,no extra heart sounds, no edema GI: abdomen soft, normal BS, no masses, no tenderness, no hepatomegaly, no splenomegaly PSYCHIATRIC: the patient is alert & oriented to person, affect & behavior appropriate  LABS/RADIOLOGY: Labs reviewed: Basic Metabolic Panel:  Recent Labs  03/04/13 2310 03/05/13 0500 03/07/13 0825  NA 144 141 139  K 3.6 2.8* 3.7  CL 107 107 103  CO2 31 29 29   GLUCOSE 136* 131* 133*  BUN 12 9 11   CREATININE 0.92 0.67 0.72  CALCIUM 9.1 8.6 9.7  MG  --  1.7  --   PHOS  --  2.0*  --    Liver Function Tests:  Recent Labs  02/15/13 1538 03/04/13 2310 03/05/13 0500  AST 22 26 24   ALT 20 20 17   ALKPHOS 101 85 76  BILITOT 0.4 0.3 0.2*  PROT 7.8 6.0 5.5*  ALBUMIN 4.3 3.1* 2.8*    Recent Labs  04/18/12 1347 11/30/12 1926 02/15/13 1538  LIPASE 13 32 23   CBC:  Recent Labs  11/30/12 1926 02/15/13 1538 03/04/13 2310 03/05/13 0500  WBC 11.6* 10.3 7.6 7.8  NEUTROABS 5.9 5.7 5.1  --   HGB 13.4 13.6 11.3* 10.4*  HCT 37.4 39.6 32.8* 30.3*  MCV 93.5 95.2 94.8 94.4  PLT 127* 147* 123* 113*   Cardiac Enzymes:  Recent Labs  08/08/12 1653 03/04/13 2350  CKTOTAL  --  396*  TROPONINI <0.30  --    CBG:  Recent Labs  03/07/13 2158 03/08/13 0736 03/08/13 1136  GLUCAP 139* 135* 163*     ASSESSMENT/PLAN:  Progress/Chronic Leg Weakness - for rehabilitation  Dementia - stable  Diabetes mellitus, type II - diet controlled  Hypertension - well controlled; continue losartan  GERD - stable; continue Prilosec  Depression - continue Celexa  Hyperlipidemia - continue Lipitor  Insomnia - no complaints; continue Desyrel   CPT CODE: 99774

## 2013-03-31 NOTE — Progress Notes (Signed)
[name], female   DOB: 01/11/1941, 73 y.o.   MRN: [id number]                PROGRESS NOTE  DATE: 03/29/13  FACILITY: Nursing Home Location: [facility]  LEVEL OF CARE: SNF (31)  Acute Visit  CHIEF COMPLAINT:  Discharge Notes  HISTORY OF PRESENT ILLNESS: This is a 73 year old female who is for discharge home with Home health PT, OT and Nursing. She has been admitted to [facility] on 03/08/13 from [facility] with progressive leg weakness. Patient was admitted to this facility for short-term rehabilitation after the patient's recent hospitalization.  Patient has completed SNF rehabilitation and therapy has cleared the patient for discharge.   REASSESSMENT OF ONGOING PROBLEM(S):  HTN: Pt 's HTN remains stable.  Denies CP, sob, DOE, pedal edema, headaches, dizziness or visual disturbances.  No complications from the medications currently being used.  Last BP :  116/68  GERD: pt's GERD is stable.  Denies ongoing heartburn, abd. Pain, nausea or vomiting.  Currently on a PPI & tolerates it without any adverse reactions.  HYPERLIPIDEMIA: No complications from the medications presently being used.    PAST MEDICAL HISTORY : Reviewed.  No changes.  CURRENT MEDICATIONS: Reviewed per [facility]  REVIEW OF SYSTEMS:  GENERAL: no change in appetite, no fatigue, no weight changes, no fever, chills or weakness RESPIRATORY: no cough, SOB, DOE, wheezing, hemoptysis CARDIAC: no chest pain, edema or palpitations GI: no abdominal pain, diarrhea, constipation, heart burn, nausea or vomiting  PHYSICAL EXAMINATION  VS:  T98.6      P70      RR20      BP116/68     POX96 %     WT170.8 (Lb)  GENERAL: no acute distress, normal body habitus NECK: supple, trachea midline, no neck masses, no thyroid tenderness, no thyromegaly LYMPHATICS: no LAN in the neck, no supraclavicular LAN RESPIRATORY: breathing is even & unlabored, BS CTAB CARDIAC: RRR, no murmur,no extra  heart sounds, no edema GI: abdomen soft, normal BS, no masses, no tenderness, no hepatomegaly, no splenomegaly PSYCHIATRIC: the patient is alert & oriented to person, affect & behavior appropriate  LABS/RADIOLOGY: Labs reviewed: Basic Metabolic Panel:  Recent Labs  03/04/13 2310 03/05/13 0500 03/07/13 0825  NA 144 141 139  K 3.6 2.8* 3.7  CL 107 107 103  CO2 31 29 29   GLUCOSE 136* 131* 133*  BUN 12 9 11   CREATININE 0.92 0.67 0.72  CALCIUM 9.1 8.6 9.7  MG  --  1.7  --   PHOS  --  2.0*  --    Liver Function Tests:  Recent Labs  02/15/13 1538 03/04/13 2310 03/05/13 0500  AST 22 26 24   ALT 20 20 17   ALKPHOS 101 85 76  BILITOT 0.4 0.3 0.2*  PROT 7.8 6.0 5.5*  ALBUMIN 4.3 3.1* 2.8*    Recent Labs  04/18/12 1347 11/30/12 1926 02/15/13 1538  LIPASE 13 32 23   CBC:  Recent Labs  11/30/12 1926 02/15/13 1538 03/04/13 2310 03/05/13 0500  WBC 11.6* 10.3 7.6 7.8  NEUTROABS 5.9 5.7 5.1  --   HGB 13.4 13.6 11.3* 10.4*  HCT 37.4 39.6 32.8* 30.3*  MCV 93.5 95.2 94.8 94.4  PLT 127* 147* 123* 113*   Cardiac Enzymes:  Recent Labs  08/08/12 1653 03/04/13 2350  CKTOTAL  --  396*  TROPONINI <0.30  --    CBG:  Recent Labs  03/07/13 2158  03/08/13 0736 03/08/13 1136  GLUCAP 139* 135* 163*     ASSESSMENT/PLAN:  Progress/Chronic Leg Weakness - for Home health PT, OT and Nursing  Dementia - stable  Diabetes mellitus, type II - diet controlled  Hypertension - well controlled; continue losartan  GERD - stable; continue Prilosec  Depression - continue Celexa  Hyperlipidemia - continue Lipitor  Insomnia - no complaints; continue Desyrel   I have filled out patient's discharge paperwork and written prescriptions.  Patient will receive home health PT, OT and Nursing.  Total discharge time: Less than 30 minutes Discharge time involved coordination of the discharge process with Education officer, museum, nursing staff and therapy department. Medical justification  for home health services verified.    CPT CODE: 63785                PROGRESS NOTE  DATE: 03/29/2013   FACILITY: [facility]  LEVEL OF CARE: SNF (31)  Discharge Visit  CHIEF COMPLAINT:  Manage  HISTORY OF PRESENT ILLNESS: I was requested by the social worker to perform face-to-face evaluation for discharge:  Patient was admitted to this facility for short-term rehabilitation after the patient's recent hospitalization.  Patient has completed SNF rehabilitation and therapy has cleared the patient for discharge.  Reassessment of ongoing problem(s):  PAST MEDICAL HISTORY : Reviewed.  No changes.  CURRENT MEDICATIONS: Reviewed per [facility]  REVIEW OF SYSTEMS:  GENERAL: no change in appetite, no fatigue, no weight changes, no fever, chills or weakness RESPIRATORY: no cough, SOB, DOE, wheezing, hemoptysis CARDIAC: no chest pain, edema or palpitations GI: no abdominal pain, diarrhea, constipation, heart burn, nausea or vomiting  PHYSICAL EXAMINATION  VS:  T       P       RR      BP      POX %       WT (Lb)  GENERAL: no acute distress, normal body habitus EYES: conjunctivae normal, sclerae normal, normal eye lids NECK: supple, trachea midline, no neck masses, no thyroid tenderness, no thyromegaly LYMPHATICS: no LAN in the neck, no supraclavicular LAN RESPIRATORY: breathing is even & unlabored, BS CTAB CARDIAC: RRR, no murmur,no extra heart sounds, no edema GI: abdomen soft, normal BS, no masses, no tenderness, no hepatomegaly, no splenomegaly PSYCHIATRIC: the patient is alert & oriented to person, affect & behavior appropriate  LABS/RADIOLOGY:  ASSESSMENT/PLAN:  I have filled out patient's discharge paperwork and written prescriptions.  Patient will receive home health PT, OT, ST, nursing and CNA. DME provided:  Total discharge time: Greater/less than 30 minutes Discharge time involved coordination of the discharge process with Education officer, museum, nursing staff  and therapy department. Medical justification for home health services/DME verified.  CPT CODE: [id number]

## 2013-06-05 ENCOUNTER — Other Ambulatory Visit: Payer: Self-pay | Admitting: Internal Medicine

## 2013-06-05 ENCOUNTER — Other Ambulatory Visit: Payer: Self-pay | Admitting: Family Medicine

## 2013-06-05 DIAGNOSIS — Z1231 Encounter for screening mammogram for malignant neoplasm of breast: Secondary | ICD-10-CM

## 2013-06-05 DIAGNOSIS — E2839 Other primary ovarian failure: Secondary | ICD-10-CM

## 2013-08-31 ENCOUNTER — Other Ambulatory Visit: Payer: Self-pay | Admitting: Neurosurgery

## 2013-08-31 DIAGNOSIS — M5126 Other intervertebral disc displacement, lumbar region: Secondary | ICD-10-CM

## 2013-09-07 ENCOUNTER — Ambulatory Visit
Admission: RE | Admit: 2013-09-07 | Discharge: 2013-09-07 | Disposition: A | Discharge status: Home / Self Care | Payer: Medicare Other | Source: Ambulatory Visit | Attending: Neurosurgery | Admitting: Neurosurgery

## 2013-09-07 ENCOUNTER — Other Ambulatory Visit: Payer: Medicare Other

## 2013-09-07 DIAGNOSIS — M5126 Other intervertebral disc displacement, lumbar region: Secondary | ICD-10-CM

## 2013-09-07 MED ORDER — GADOBENATE DIMEGLUMINE 529 MG/ML IV SOLN
15.0000 mL | Freq: Once | INTRAVENOUS | Status: AC | PRN
  Administered 2013-09-07: 15 mL via INTRAVENOUS

## 2013-10-25 ENCOUNTER — Emergency Department (HOSPITAL_COMMUNITY): Payer: Medicare Other

## 2013-10-25 ENCOUNTER — Emergency Department (HOSPITAL_COMMUNITY)
Admission: EM | Admit: 2013-10-25 | Discharge: 2013-10-26 | Disposition: A | Discharge status: Home / Self Care | Payer: Medicare Other | Attending: Emergency Medicine | Admitting: Emergency Medicine

## 2013-10-25 ENCOUNTER — Encounter (HOSPITAL_COMMUNITY): Payer: Self-pay | Admitting: Emergency Medicine

## 2013-10-25 DIAGNOSIS — Z8742 Personal history of other diseases of the female genital tract: Secondary | ICD-10-CM | POA: Insufficient documentation

## 2013-10-25 DIAGNOSIS — F411 Generalized anxiety disorder: Secondary | ICD-10-CM | POA: Insufficient documentation

## 2013-10-25 DIAGNOSIS — E119 Type 2 diabetes mellitus without complications: Secondary | ICD-10-CM | POA: Diagnosis not present

## 2013-10-25 DIAGNOSIS — S3981XA Other specified injuries of abdomen, initial encounter: Secondary | ICD-10-CM | POA: Insufficient documentation

## 2013-10-25 DIAGNOSIS — Z8739 Personal history of other diseases of the musculoskeletal system and connective tissue: Secondary | ICD-10-CM | POA: Insufficient documentation

## 2013-10-25 DIAGNOSIS — I10 Essential (primary) hypertension: Secondary | ICD-10-CM | POA: Diagnosis not present

## 2013-10-25 DIAGNOSIS — F0390 Unspecified dementia without behavioral disturbance: Secondary | ICD-10-CM | POA: Diagnosis not present

## 2013-10-25 DIAGNOSIS — Z8719 Personal history of other diseases of the digestive system: Secondary | ICD-10-CM | POA: Insufficient documentation

## 2013-10-25 DIAGNOSIS — R1032 Left lower quadrant pain: Secondary | ICD-10-CM | POA: Diagnosis not present

## 2013-10-25 DIAGNOSIS — Z79899 Other long term (current) drug therapy: Secondary | ICD-10-CM | POA: Diagnosis not present

## 2013-10-25 DIAGNOSIS — IMO0002 Reserved for concepts with insufficient information to code with codable children: Secondary | ICD-10-CM

## 2013-10-25 LAB — URINALYSIS, ROUTINE W REFLEX MICROSCOPIC
Bilirubin Urine: NEGATIVE
Glucose, UA: NEGATIVE mg/dL
Hgb urine dipstick: NEGATIVE
Ketones, ur: NEGATIVE mg/dL
NITRITE: NEGATIVE
Protein, ur: NEGATIVE mg/dL
Specific Gravity, Urine: 1.014 (ref 1.005–1.030)
UROBILINOGEN UA: 1 mg/dL (ref 0.0–1.0)
pH: 6 (ref 5.0–8.0)

## 2013-10-25 LAB — LIPASE, BLOOD: Lipase: 17 U/L (ref 11–59)

## 2013-10-25 LAB — CBC
HCT: 36.6 % (ref 36.0–46.0)
Hemoglobin: 12.7 g/dL (ref 12.0–15.0)
MCH: 32 pg (ref 26.0–34.0)
MCHC: 34.7 g/dL (ref 30.0–36.0)
MCV: 92.2 fL (ref 78.0–100.0)
Platelets: 177 10*3/uL (ref 150–400)
RBC: 3.97 MIL/uL (ref 3.87–5.11)
RDW: 12.1 % (ref 11.5–15.5)
WBC: 8.7 10*3/uL (ref 4.0–10.5)

## 2013-10-25 LAB — URINE MICROSCOPIC-ADD ON

## 2013-10-25 LAB — COMPREHENSIVE METABOLIC PANEL
ALBUMIN: 3.9 g/dL (ref 3.5–5.2)
ALT: 13 U/L (ref 0–35)
ANION GAP: 13 (ref 5–15)
AST: 21 U/L (ref 0–37)
Alkaline Phosphatase: 117 U/L (ref 39–117)
BUN: 11 mg/dL (ref 6–23)
CO2: 26 mEq/L (ref 19–32)
Calcium: 9.7 mg/dL (ref 8.4–10.5)
Chloride: 101 mEq/L (ref 96–112)
Creatinine, Ser: 0.73 mg/dL (ref 0.50–1.10)
GFR calc Af Amer: >90 mL/min (ref >90)
GFR calc non Af Amer: 83 mL/min — ABNORMAL LOW (ref >90)
Glucose, Bld: 140 mg/dL — ABNORMAL HIGH (ref 70–99)
Potassium: 3.1 mEq/L — ABNORMAL LOW (ref 3.7–5.3)
Sodium: 140 mEq/L (ref 137–147)
TOTAL PROTEIN: 7.5 g/dL (ref 6.0–8.3)
Total Bilirubin: 0.4 mg/dL (ref 0.3–1.2)

## 2013-10-25 LAB — CBG MONITORING, ED: GLUCOSE-CAPILLARY: 248 mg/dL — AB (ref 70–99)

## 2013-10-25 MED ORDER — SODIUM CHLORIDE 0.9 % IV BOLUS (SEPSIS)
500.0000 mL | Freq: Once | INTRAVENOUS | Status: AC
  Administered 2013-10-25: 500 mL via INTRAVENOUS

## 2013-10-25 MED ORDER — DIAZEPAM 5 MG PO TABS
5.0000 mg | ORAL_TABLET | Freq: Every evening | ORAL | Status: DC | PRN
  Administered 2013-10-25: 5 mg via ORAL
  Filled 2013-10-25: qty 1

## 2013-10-25 MED ORDER — IOHEXOL 300 MG/ML  SOLN
50.0000 mL | Freq: Once | INTRAMUSCULAR | Status: AC | PRN
  Administered 2013-10-25: 50 mL via ORAL

## 2013-10-25 MED ORDER — POTASSIUM CHLORIDE CRYS ER 20 MEQ PO TBCR
20.0000 meq | EXTENDED_RELEASE_TABLET | Freq: Every day | ORAL | Status: DC
  Administered 2013-10-26: 20 meq via ORAL
  Filled 2013-10-25: qty 1

## 2013-10-25 MED ORDER — IOHEXOL 300 MG/ML  SOLN
100.0000 mL | Freq: Once | INTRAMUSCULAR | Status: AC | PRN
  Administered 2013-10-25: 100 mL via INTRAVENOUS

## 2013-10-25 MED ORDER — OXYCODONE-ACETAMINOPHEN 5-325 MG PO TABS
1.0000 | ORAL_TABLET | Freq: Once | ORAL | Status: AC
  Administered 2013-10-25: 1 via ORAL
  Filled 2013-10-25: qty 1

## 2013-10-25 MED ORDER — TRAZODONE HCL 100 MG PO TABS
100.0000 mg | ORAL_TABLET | Freq: Every day | ORAL | Status: DC
  Administered 2013-10-25: 100 mg via ORAL
  Filled 2013-10-25: qty 1

## 2013-10-25 MED ORDER — POTASSIUM CHLORIDE CRYS ER 20 MEQ PO TBCR
40.0000 meq | EXTENDED_RELEASE_TABLET | Freq: Once | ORAL | Status: AC
  Administered 2013-10-25: 40 meq via ORAL
  Filled 2013-10-25: qty 2

## 2013-10-25 MED ORDER — OXYCODONE HCL 5 MG PO TABS
5.0000 mg | ORAL_TABLET | Freq: Every day | ORAL | Status: DC | PRN
  Administered 2013-10-26: 5 mg via ORAL
  Filled 2013-10-25: qty 1

## 2013-10-25 NOTE — ED Notes (Signed)
Bed: RESA Expected date:  Expected time:  Means of arrival:  Comments: ems- 73 abdominal pain
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
## 2013-10-25 NOTE — ED Notes (Addendum)
Social worker Scientist, physiological at bedside
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
## 2013-10-26 LAB — URINE CULTURE

## 2013-10-26 LAB — CBG MONITORING, ED: Glucose-Capillary: 144 mg/dL — ABNORMAL HIGH (ref 70–99)

## 2013-10-26 MED ORDER — TUBERCULIN PPD 5 UNIT/0.1ML ID SOLN
5.0000 [IU] | Freq: Once | INTRADERMAL | Status: DC
  Administered 2013-10-26: 5 [IU] via INTRADERMAL
  Filled 2013-10-26: qty 0.1

## 2013-10-26 NOTE — ED Notes (Signed)
PT at bedside.

## 2013-10-26 NOTE — ED Notes (Signed)
Social work at bedside.  

## 2013-10-26 NOTE — ED Notes (Signed)
Initial Contact - pt A+OX4, resting on stretcher.  Pt denies needs/complaints at this time.  Pt reports feeling good "about finally making a decision", per social work, pt accepted placement at an assisted living facility.  Skin PWD.  Speaking full/clear sentences, rr even/un-lab.  MAEI.  NAD.

## 2013-10-26 NOTE — ED Notes (Signed)
Pt ambulatory with steady gait with rolling walker through unit.  Pt sitting up in bedside chair, reports feeling more comfortable and happy at this time.  Denies further needs/complaints.  NAD.

## 2013-10-26 NOTE — ED Notes (Signed)
[facility] contacted to provide transport to [facility].
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
## 2013-10-26 NOTE — ED Notes (Signed)
Pt resting on stretcher.  Pt aware of upcoming transfer.  Facility not able to take report until 1530.  Pt denies needs/complaints.  NAD.
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
## 2013-10-26 NOTE — Progress Notes (Signed)
CSW attempted to get intouch with Junius Finner with APS on her cell or office phone unsuccessfully.  CSW called and spoke with Reva Bores APS supervisor [phone] to inform him that the patient was discharged to [facility] for safety.  He was informed that the patient was concerned about the personal belongings left in her home and wanting the option of changing housing units within the housing authority system.  Mr. Laurann Montana informed CSW that he will follow up with the patient or get Tia Turner to follow up as soon as possible.     Chesley Noon, MSW, [place], 10/26/2013 Evening Clinical Social Worker [phone]

## 2013-10-26 NOTE — ED Notes (Signed)
PTAR at bedside 

## 2013-10-26 NOTE — Progress Notes (Addendum)
3:00pm. [facility] CSW has notified Harun at facility. They will receive pt at 3:30pm. Can call report to [place] at [phone]. Signed FL2 and other necessary docs on chart.  CSW placed call to Junius Finner, APS worker, to coordinate getting pt's belongings safely to [facility]. Left message.  CSW to sign off.  Rochele Pages,     ED CSW  phone: [phone]
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
## 2013-10-28 ENCOUNTER — Encounter (HOSPITAL_COMMUNITY): Payer: Self-pay | Admitting: Emergency Medicine

## 2013-10-28 ENCOUNTER — Emergency Department (HOSPITAL_COMMUNITY)
Admission: EM | Admit: 2013-10-28 | Discharge: 2013-10-28 | Disposition: A | Discharge status: Home / Self Care | Payer: Medicare Other | Attending: Emergency Medicine | Admitting: Emergency Medicine

## 2013-10-28 DIAGNOSIS — I10 Essential (primary) hypertension: Secondary | ICD-10-CM | POA: Diagnosis not present

## 2013-10-28 DIAGNOSIS — M25552 Pain in left hip: Secondary | ICD-10-CM

## 2013-10-28 DIAGNOSIS — Z8742 Personal history of other diseases of the female genital tract: Secondary | ICD-10-CM | POA: Diagnosis not present

## 2013-10-28 DIAGNOSIS — Z79899 Other long term (current) drug therapy: Secondary | ICD-10-CM | POA: Diagnosis not present

## 2013-10-28 DIAGNOSIS — M25559 Pain in unspecified hip: Secondary | ICD-10-CM | POA: Insufficient documentation

## 2013-10-28 DIAGNOSIS — F0390 Unspecified dementia without behavioral disturbance: Secondary | ICD-10-CM | POA: Diagnosis not present

## 2013-10-28 DIAGNOSIS — Z8719 Personal history of other diseases of the digestive system: Secondary | ICD-10-CM | POA: Diagnosis not present

## 2013-10-28 DIAGNOSIS — G8929 Other chronic pain: Secondary | ICD-10-CM | POA: Diagnosis not present

## 2013-10-28 DIAGNOSIS — E119 Type 2 diabetes mellitus without complications: Secondary | ICD-10-CM | POA: Diagnosis not present

## 2013-10-28 DIAGNOSIS — Z8739 Personal history of other diseases of the musculoskeletal system and connective tissue: Secondary | ICD-10-CM | POA: Diagnosis not present

## 2013-10-28 LAB — CBG MONITORING, ED: GLUCOSE-CAPILLARY: 100 mg/dL — AB (ref 70–99)

## 2013-10-28 MED ORDER — DIAZEPAM 5 MG PO TABS: 5.0000 mg | ORAL_TABLET | Freq: Two times a day (BID) | ORAL | Status: DC

## 2013-10-28 MED ORDER — OXYCODONE-ACETAMINOPHEN 5-325 MG PO TABS
1.0000 | ORAL_TABLET | Freq: Once | ORAL | Status: AC
  Administered 2013-10-28: 1 via ORAL
  Filled 2013-10-28: qty 1

## 2013-10-28 MED ORDER — OXYCODONE-ACETAMINOPHEN 5-325 MG PO TABS
1.0000 | ORAL_TABLET | Freq: Three times a day (TID) | ORAL | Status: DC | PRN
Start: 2013-10-28 — End: 2014-05-01

## 2013-10-28 NOTE — ED Notes (Signed)
Notified PTAR for transport back to [facility]

## 2013-10-28 NOTE — Discharge Instructions (Signed)

## 2013-10-28 NOTE — ED Provider Notes (Signed)
Medical screening examination/treatment/procedure(s) were performed by non-physician practitioner and as supervising physician I was immediately available for consultation/collaboration.   Leota Jacobsen, MD 10/28/13 [phone]

## 2013-10-28 NOTE — ED Notes (Signed)
PTAR requested for return to [facility].

## 2013-10-28 NOTE — ED Notes (Addendum)
From [facility]; brought by EMS for left hip pain and spasms in left leg. Seen at [facility] 3 days ago after a fall; states sent home without pain medication.

## 2013-10-28 NOTE — ED Provider Notes (Signed)
CSN: [id number]     Arrival date & time 10/28/13  1059 History   First MD Initiated Contact with Patient 10/28/13 1122     Chief Complaint  Patient presents with  . Hip Pain     (Consider location/radiation/quality/duration/timing/severity/associated sxs/prior Treatment) HPI Comments: Pt states that she continuing to have on going hip pain in her left hip that spasms down her leg. Denies numbness or weakness. Pt states that she is supposed to be on pain medication and muscle relaxers for it but since [facility] she hasn't gotten her medications. Denies recent fall. Pt states that she has had this pain since the had her back surgery  The history is provided by the patient. No language interpreter was used.    Past Medical History  Diagnosis Date  . Constipation   . Pelvic adhesions   . Diabetes mellitus   . Hypertension   . Arthritis     hips/neck  . Dementia     per spouse   Past Surgical History  Procedure Laterality Date  . Partial hysterectomy    . Uterine fibroid surgery  1992    tumor removed  . Lumbar laminectomy/decompression microdiscectomy Right 09/23/2012    Procedure: LUMBAR LAMINECTOMY/DECOMPRESSION MICRODISCECTOMY 1 LEVEL;  Surgeon: Elaina Hoops, MD;  Location: [facility];  Service: Neurosurgery;  Laterality: Right;  right lumbar five-sacral one  . Abdominal hysterectomy    . Back surgery    . Esophagogastroduodenoscopy (egd) with propofol N/A 01/10/2013    Procedure: ESOPHAGOGASTRODUODENOSCOPY (EGD) WITH PROPOFOL;  Surgeon: Garlan Fair, MD;  Location: [facility];  Service: Endoscopy;  Laterality: N/A;  . Colonoscopy with propofol N/A 01/10/2013    Procedure: COLONOSCOPY WITH PROPOFOL;  Surgeon: Garlan Fair, MD;  Location: [facility];  Service: Endoscopy;  Laterality: N/A;   Family History  Problem Relation Age of Onset  . Cancer Father     bone  . Heart disease Mother    History  Substance Use Topics  . Smoking  status: Never Smoker   . Smokeless tobacco: Never Used  . Alcohol Use: No   OB History   Grav Para Term Preterm Abortions TAB SAB Ect Mult Living   1 1        1      Review of Systems  Constitutional: Negative.   Respiratory: Negative.   Cardiovascular: Negative.       Allergies  Review of patient's allergies indicates no known allergies.  Home Medications   Prior to Admission medications   Medication Sig Start Date End Date Taking? Authorizing Provider  diazepam (VALIUM) 5 MG tablet Take 1 tablet (5 mg total) by mouth at bedtime as needed for anxiety (or sleep). 03/07/13   Domenic Polite, MD  oxycodone (OXY-IR) 5 MG capsule Take 5 mg by mouth daily as needed for pain.    Historical Provider, MD  potassium chloride SA (K-DUR,KLOR-CON) 20 MEQ tablet Take 20 mEq by mouth daily.    Historical Provider, MD  traZODone (DESYREL) 100 MG tablet Take 100 mg by mouth at bedtime.    Historical Provider, MD   BP 143/79  Pulse 79  Temp(Src) 98.2 F (36.8 C) (Oral)  Resp 19  SpO2 99% Physical Exam  Nursing note and vitals reviewed. Constitutional: She is oriented to person, place, and time. She appears well-developed and well-nourished.  Cardiovascular: Normal rate and regular rhythm.   Pulmonary/Chest: Effort normal and breath sounds normal.  Abdominal: Soft. Bowel  sounds are normal. There is no tenderness.  Musculoskeletal: Normal range of motion.  Tender to the left hip. Pt has full rom, no shortening or rotation  Neurological: She is alert and oriented to person, place, and time.  Skin: Skin is warm and dry.  Psychiatric:  Tearful on exam    ED Course  Procedures (including critical care time) Labs Review Labs Reviewed - No data to display  Imaging Review No results found.   EKG Interpretation None      MDM   Final diagnoses:  Chronic hip pain, left    [facility]. Will put back on valium and oxycodone like  previous visits indicate. Pt had x-ray in the last 3 days in er visit no new injury. Don't think another image is needed     Glendell Docker, NP 10/28/13 1217

## 2014-01-15 ENCOUNTER — Encounter (HOSPITAL_COMMUNITY): Payer: Self-pay | Admitting: Emergency Medicine

## 2014-01-22 ENCOUNTER — Emergency Department (HOSPITAL_COMMUNITY): Payer: No Typology Code available for payment source

## 2014-01-22 ENCOUNTER — Encounter (HOSPITAL_COMMUNITY): Payer: Self-pay | Admitting: *Deleted

## 2014-01-22 ENCOUNTER — Encounter (HOSPITAL_COMMUNITY): Payer: Self-pay | Admitting: Emergency Medicine

## 2014-01-22 ENCOUNTER — Emergency Department (HOSPITAL_COMMUNITY)
Admission: EM | Admit: 2014-01-22 | Discharge: 2014-01-22 | Disposition: A | Discharge status: Home / Self Care | Payer: No Typology Code available for payment source | Attending: Emergency Medicine | Admitting: Emergency Medicine

## 2014-01-22 ENCOUNTER — Emergency Department (INDEPENDENT_AMBULATORY_CARE_PROVIDER_SITE_OTHER)
Admission: EM | Admit: 2014-01-22 | Discharge: 2014-01-22 | Disposition: A | Discharge status: Nursing Home (Includes ICF) | Payer: Medicare Other | Source: Home / Self Care | Attending: Family Medicine | Admitting: Family Medicine

## 2014-01-22 DIAGNOSIS — M1990 Unspecified osteoarthritis, unspecified site: Secondary | ICD-10-CM | POA: Insufficient documentation

## 2014-01-22 DIAGNOSIS — F0390 Unspecified dementia without behavioral disturbance: Secondary | ICD-10-CM | POA: Diagnosis not present

## 2014-01-22 DIAGNOSIS — E119 Type 2 diabetes mellitus without complications: Secondary | ICD-10-CM | POA: Insufficient documentation

## 2014-01-22 DIAGNOSIS — R102 Pelvic and perineal pain: Secondary | ICD-10-CM

## 2014-01-22 DIAGNOSIS — Z8719 Personal history of other diseases of the digestive system: Secondary | ICD-10-CM | POA: Diagnosis not present

## 2014-01-22 DIAGNOSIS — Y9389 Activity, other specified: Secondary | ICD-10-CM | POA: Diagnosis not present

## 2014-01-22 DIAGNOSIS — Y998 Other external cause status: Secondary | ICD-10-CM | POA: Insufficient documentation

## 2014-01-22 DIAGNOSIS — Z8742 Personal history of other diseases of the female genital tract: Secondary | ICD-10-CM | POA: Diagnosis not present

## 2014-01-22 DIAGNOSIS — I10 Essential (primary) hypertension: Secondary | ICD-10-CM | POA: Diagnosis not present

## 2014-01-22 DIAGNOSIS — Y92410 Unspecified street and highway as the place of occurrence of the external cause: Secondary | ICD-10-CM | POA: Diagnosis not present

## 2014-01-22 DIAGNOSIS — S79912A Unspecified injury of left hip, initial encounter: Secondary | ICD-10-CM | POA: Diagnosis not present

## 2014-01-22 DIAGNOSIS — Z79899 Other long term (current) drug therapy: Secondary | ICD-10-CM | POA: Insufficient documentation

## 2014-01-22 DIAGNOSIS — Z7982 Long term (current) use of aspirin: Secondary | ICD-10-CM | POA: Diagnosis not present

## 2014-01-22 DIAGNOSIS — M25552 Pain in left hip: Secondary | ICD-10-CM
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
## 2014-01-22 NOTE — Discharge Instructions (Signed)
May take tylenol or motrin as needed for pain. Follow-up with your primary care physician. Return to the ED for new concerns.
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
## 2014-01-22 NOTE — ED Notes (Signed)
Dr Zavitz at bedside  
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
## 2014-02-01 ENCOUNTER — Other Ambulatory Visit: Payer: Self-pay | Admitting: Neurosurgery

## 2014-02-01 DIAGNOSIS — M5136 Other intervertebral disc degeneration, lumbar region: Secondary | ICD-10-CM

## 2014-02-06 ENCOUNTER — Inpatient Hospital Stay: Admission: RE | Admit: 2014-02-06 | Payer: Medicare Other | Source: Ambulatory Visit

## 2014-02-06 ENCOUNTER — Other Ambulatory Visit: Payer: Medicare Other

## 2014-02-12 ENCOUNTER — Ambulatory Visit
Admission: RE | Admit: 2014-02-12 | Discharge: 2014-02-12 | Disposition: A | Discharge status: Home / Self Care | Payer: Medicare Other | Source: Ambulatory Visit | Attending: Neurosurgery | Admitting: Neurosurgery

## 2014-02-12 ENCOUNTER — Other Ambulatory Visit: Payer: Self-pay | Admitting: Neurosurgery

## 2014-02-12 DIAGNOSIS — M5136 Other intervertebral disc degeneration, lumbar region: Secondary | ICD-10-CM

## 2014-02-12 MED ORDER — IOHEXOL 180 MG/ML  SOLN
1.0000 mL | Freq: Once | INTRAMUSCULAR | Status: AC | PRN
  Administered 2014-02-12: 1 mL via INTRA_ARTICULAR

## 2014-02-12 MED ORDER — METHYLPREDNISOLONE ACETATE 40 MG/ML INJ SUSP (RADIOLOG
120.0000 mg | Freq: Once | INTRAMUSCULAR | Status: AC
  Administered 2014-02-12: 120 mg via INTRA_ARTICULAR

## 2014-02-12 NOTE — Discharge Instructions (Signed)

## 2014-02-27 ENCOUNTER — Other Ambulatory Visit: Payer: Self-pay | Admitting: Internal Medicine

## 2014-02-27 DIAGNOSIS — E2839 Other primary ovarian failure: Secondary | ICD-10-CM

## 2014-03-19 ENCOUNTER — Encounter (INDEPENDENT_AMBULATORY_CARE_PROVIDER_SITE_OTHER): Payer: Self-pay

## 2014-03-19 ENCOUNTER — Ambulatory Visit
Admission: RE | Admit: 2014-03-19 | Discharge: 2014-03-19 | Disposition: A | Discharge status: Home / Self Care | Payer: Medicare Other | Source: Ambulatory Visit | Attending: Internal Medicine | Admitting: Internal Medicine

## 2014-03-19 ENCOUNTER — Ambulatory Visit
Admission: RE | Admit: 2014-03-19 | Discharge: 2014-03-19 | Disposition: A | Discharge status: Home / Self Care | Payer: Medicare Other | Source: Ambulatory Visit | Attending: Family Medicine | Admitting: Family Medicine

## 2014-03-19 DIAGNOSIS — E2839 Other primary ovarian failure: Secondary | ICD-10-CM

## 2014-03-19 DIAGNOSIS — Z1231 Encounter for screening mammogram for malignant neoplasm of breast: Secondary | ICD-10-CM

## 2014-03-30 ENCOUNTER — Emergency Department (HOSPITAL_COMMUNITY)
Admission: EM | Admit: 2014-03-30 | Discharge: 2014-03-30 | Disposition: A | Discharge status: Home / Self Care | Payer: Medicare Other | Attending: Emergency Medicine | Admitting: Emergency Medicine

## 2014-03-30 ENCOUNTER — Encounter (HOSPITAL_COMMUNITY): Payer: Self-pay | Admitting: *Deleted

## 2014-03-30 ENCOUNTER — Emergency Department (HOSPITAL_COMMUNITY): Payer: Medicare Other

## 2014-03-30 DIAGNOSIS — Z7982 Long term (current) use of aspirin: Secondary | ICD-10-CM | POA: Insufficient documentation

## 2014-03-30 DIAGNOSIS — Z79899 Other long term (current) drug therapy: Secondary | ICD-10-CM | POA: Insufficient documentation

## 2014-03-30 DIAGNOSIS — M549 Dorsalgia, unspecified: Secondary | ICD-10-CM | POA: Insufficient documentation

## 2014-03-30 DIAGNOSIS — M169 Osteoarthritis of hip, unspecified: Secondary | ICD-10-CM | POA: Diagnosis not present

## 2014-03-30 DIAGNOSIS — M47812 Spondylosis without myelopathy or radiculopathy, cervical region: Secondary | ICD-10-CM | POA: Insufficient documentation

## 2014-03-30 DIAGNOSIS — F0390 Unspecified dementia without behavioral disturbance: Secondary | ICD-10-CM | POA: Insufficient documentation

## 2014-03-30 DIAGNOSIS — R630 Anorexia: Secondary | ICD-10-CM | POA: Insufficient documentation

## 2014-03-30 DIAGNOSIS — I10 Essential (primary) hypertension: Secondary | ICD-10-CM | POA: Insufficient documentation

## 2014-03-30 DIAGNOSIS — M79605 Pain in left leg: Secondary | ICD-10-CM | POA: Insufficient documentation

## 2014-03-30 DIAGNOSIS — E119 Type 2 diabetes mellitus without complications: Secondary | ICD-10-CM | POA: Diagnosis not present

## 2014-03-30 DIAGNOSIS — Z8719 Personal history of other diseases of the digestive system: Secondary | ICD-10-CM | POA: Diagnosis not present

## 2014-03-30 DIAGNOSIS — Z8742 Personal history of other diseases of the female genital tract: Secondary | ICD-10-CM | POA: Diagnosis not present

## 2014-03-30 DIAGNOSIS — R0602 Shortness of breath: Secondary | ICD-10-CM | POA: Insufficient documentation

## 2014-03-30 DIAGNOSIS — G8929 Other chronic pain: Secondary | ICD-10-CM | POA: Diagnosis not present

## 2014-03-30 DIAGNOSIS — R109 Unspecified abdominal pain: Secondary | ICD-10-CM

## 2014-03-30 DIAGNOSIS — R1032 Left lower quadrant pain: Secondary | ICD-10-CM | POA: Diagnosis not present

## 2014-03-30 DIAGNOSIS — G8911 Acute pain due to trauma: Secondary | ICD-10-CM | POA: Insufficient documentation

## 2014-03-30 DIAGNOSIS — M79604 Pain in right leg: Secondary | ICD-10-CM | POA: Insufficient documentation

## 2014-03-30 DIAGNOSIS — R1030 Lower abdominal pain, unspecified: Secondary | ICD-10-CM | POA: Diagnosis present

## 2014-03-30 DIAGNOSIS — R52 Pain, unspecified: Secondary | ICD-10-CM

## 2014-03-30 LAB — CBC WITH DIFFERENTIAL/PLATELET
BASOS ABS: 0 10*3/uL (ref 0.0–0.1)
Basophils Relative: 0 % (ref 0–1)
Eosinophils Absolute: 0.1 10*3/uL (ref 0.0–0.7)
Eosinophils Relative: 1 % (ref 0–5)
HCT: 34.4 % — ABNORMAL LOW (ref 36.0–46.0)
HEMOGLOBIN: 11.7 g/dL — AB (ref 12.0–15.0)
LYMPHS PCT: 29 % (ref 12–46)
Lymphs Abs: 2 10*3/uL (ref 0.7–4.0)
MCH: 32.9 pg (ref 26.0–34.0)
MCHC: 34 g/dL (ref 30.0–36.0)
MCV: 96.6 fL (ref 78.0–100.0)
Monocytes Absolute: 0.6 10*3/uL (ref 0.1–1.0)
Monocytes Relative: 8 % (ref 3–12)
NEUTROS ABS: 4.4 10*3/uL (ref 1.7–7.7)
NEUTROS PCT: 62 % (ref 43–77)
Platelets: 175 10*3/uL (ref 150–400)
RBC: 3.56 MIL/uL — ABNORMAL LOW (ref 3.87–5.11)
RDW: 12.9 % (ref 11.5–15.5)
WBC: 7 10*3/uL (ref 4.0–10.5)

## 2014-03-30 LAB — COMPREHENSIVE METABOLIC PANEL
ALT: 12 U/L (ref 0–35)
AST: 26 U/L (ref 0–37)
Albumin: 3.7 g/dL (ref 3.5–5.2)
Alkaline Phosphatase: 86 U/L (ref 39–117)
Anion gap: 11 (ref 5–15)
BILIRUBIN TOTAL: 0.5 mg/dL (ref 0.3–1.2)
BUN: 13 mg/dL (ref 6–23)
CHLORIDE: 102 meq/L (ref 96–112)
CO2: 26 mmol/L (ref 19–32)
Calcium: 9.7 mg/dL (ref 8.4–10.5)
Creatinine, Ser: 0.96 mg/dL (ref 0.50–1.10)
GFR calc Af Amer: 66 mL/min — ABNORMAL LOW (ref >90)
GFR calc non Af Amer: 57 mL/min — ABNORMAL LOW (ref >90)
Glucose, Bld: 230 mg/dL — ABNORMAL HIGH (ref 70–99)
POTASSIUM: 3.6 mmol/L (ref 3.5–5.1)
Sodium: 139 mmol/L (ref 135–145)
Total Protein: 6.8 g/dL (ref 6.0–8.3)

## 2014-03-30 LAB — URINE MICROSCOPIC-ADD ON

## 2014-03-30 LAB — URINALYSIS, ROUTINE W REFLEX MICROSCOPIC
Bilirubin Urine: NEGATIVE
Glucose, UA: NEGATIVE mg/dL
Hgb urine dipstick: NEGATIVE
Ketones, ur: 15 mg/dL — AB
NITRITE: NEGATIVE
PROTEIN: NEGATIVE mg/dL
SPECIFIC GRAVITY, URINE: 1.027 (ref 1.005–1.030)
Urobilinogen, UA: 1 mg/dL (ref 0.0–1.0)
pH: 5 (ref 5.0–8.0)

## 2014-03-30 LAB — LIPASE, BLOOD: LIPASE: 21 U/L (ref 11–59)

## 2014-03-30 LAB — I-STAT TROPONIN, ED: TROPONIN I, POC: 0 ng/mL (ref 0.00–0.08)

## 2014-03-30 MED ORDER — IOHEXOL 300 MG/ML  SOLN
25.0000 mL | INTRAMUSCULAR | Status: AC
  Administered 2014-03-30: 25 mL via ORAL

## 2014-03-30 MED ORDER — MORPHINE SULFATE 4 MG/ML IJ SOLN
4.0000 mg | Freq: Once | INTRAMUSCULAR | Status: AC
  Administered 2014-03-30: 4 mg via INTRAVENOUS
  Filled 2014-03-30: qty 1

## 2014-03-30 MED ORDER — IOHEXOL 300 MG/ML  SOLN
100.0000 mL | Freq: Once | INTRAMUSCULAR | Status: AC | PRN
  Administered 2014-03-30: 100 mL via INTRAVENOUS

## 2014-03-30 NOTE — Discharge Instructions (Signed)
Speak with your pain management doctor if you don't feel that your pain is well-controlled. Contact Dr. Janice Norrie regarding abnormality seen on today's CT scan of your right kidney. There is the possibility that you may have cancer of the kidney. Take as directed MiraLAX daily. The pain medicine that you're on Cause constipation and discomfort. MiraLAX helps with constipation. Blood sugar today was mildly elevated at 230
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
## 2014-04-06 ENCOUNTER — Ambulatory Visit: Payer: Medicare Other | Admitting: Family Medicine

## 2014-04-10 ENCOUNTER — Ambulatory Visit: Payer: Medicare Other | Admitting: Family Medicine

## 2014-04-27 ENCOUNTER — Emergency Department (HOSPITAL_COMMUNITY): Payer: Medicare Other

## 2014-04-27 ENCOUNTER — Encounter (HOSPITAL_COMMUNITY): Payer: Self-pay

## 2014-04-27 ENCOUNTER — Inpatient Hospital Stay (HOSPITAL_COMMUNITY)
Admission: EM | Admit: 2014-04-27 | Discharge: 2014-05-01 | DRG: 641 | Disposition: A | Discharge status: Skilled Nursing Facility | Payer: Medicare Other | Attending: Internal Medicine | Admitting: Internal Medicine

## 2014-04-27 DIAGNOSIS — F329 Major depressive disorder, single episode, unspecified: Secondary | ICD-10-CM | POA: Diagnosis not present

## 2014-04-27 DIAGNOSIS — N2889 Other specified disorders of kidney and ureter: Secondary | ICD-10-CM | POA: Diagnosis present

## 2014-04-27 DIAGNOSIS — E876 Hypokalemia: Secondary | ICD-10-CM | POA: Diagnosis present

## 2014-04-27 DIAGNOSIS — I10 Essential (primary) hypertension: Secondary | ICD-10-CM | POA: Diagnosis present

## 2014-04-27 DIAGNOSIS — Z79899 Other long term (current) drug therapy: Secondary | ICD-10-CM | POA: Diagnosis not present

## 2014-04-27 DIAGNOSIS — R109 Unspecified abdominal pain: Secondary | ICD-10-CM | POA: Diagnosis present

## 2014-04-27 DIAGNOSIS — R1030 Lower abdominal pain, unspecified: Secondary | ICD-10-CM

## 2014-04-27 DIAGNOSIS — F0390 Unspecified dementia without behavioral disturbance: Secondary | ICD-10-CM | POA: Diagnosis present

## 2014-04-27 DIAGNOSIS — R490 Dysphonia: Secondary | ICD-10-CM | POA: Diagnosis not present

## 2014-04-27 DIAGNOSIS — M549 Dorsalgia, unspecified: Secondary | ICD-10-CM | POA: Diagnosis present

## 2014-04-27 DIAGNOSIS — K219 Gastro-esophageal reflux disease without esophagitis: Secondary | ICD-10-CM | POA: Diagnosis present

## 2014-04-27 DIAGNOSIS — K210 Gastro-esophageal reflux disease with esophagitis: Secondary | ICD-10-CM

## 2014-04-27 DIAGNOSIS — M1990 Unspecified osteoarthritis, unspecified site: Secondary | ICD-10-CM | POA: Diagnosis not present

## 2014-04-27 DIAGNOSIS — Z7982 Long term (current) use of aspirin: Secondary | ICD-10-CM | POA: Diagnosis not present

## 2014-04-27 DIAGNOSIS — E119 Type 2 diabetes mellitus without complications: Secondary | ICD-10-CM | POA: Diagnosis not present

## 2014-04-27 DIAGNOSIS — Z8249 Family history of ischemic heart disease and other diseases of the circulatory system: Secondary | ICD-10-CM

## 2014-04-27 DIAGNOSIS — T502X5A Adverse effect of carbonic-anhydrase inhibitors, benzothiadiazides and other diuretics, initial encounter: Secondary | ICD-10-CM | POA: Diagnosis not present

## 2014-04-27 DIAGNOSIS — E785 Hyperlipidemia, unspecified: Secondary | ICD-10-CM | POA: Diagnosis not present

## 2014-04-27 DIAGNOSIS — N736 Female pelvic peritoneal adhesions (postinfective): Secondary | ICD-10-CM | POA: Diagnosis not present

## 2014-04-27 DIAGNOSIS — Z809 Family history of malignant neoplasm, unspecified: Secondary | ICD-10-CM | POA: Diagnosis not present

## 2014-04-27 DIAGNOSIS — G8929 Other chronic pain: Secondary | ICD-10-CM | POA: Diagnosis present

## 2014-04-27 DIAGNOSIS — F32A Depression, unspecified: Secondary | ICD-10-CM | POA: Diagnosis present

## 2014-04-27 LAB — CBC WITH DIFFERENTIAL/PLATELET
Basophils Absolute: 0 10*3/uL (ref 0.0–0.1)
Basophils Relative: 0 % (ref 0–1)
EOS PCT: 1 % (ref 0–5)
Eosinophils Absolute: 0.1 10*3/uL (ref 0.0–0.7)
HCT: 34.3 % — ABNORMAL LOW (ref 36.0–46.0)
Hemoglobin: 12.1 g/dL (ref 12.0–15.0)
LYMPHS PCT: 29 % (ref 12–46)
Lymphs Abs: 2.2 10*3/uL (ref 0.7–4.0)
MCH: 33.2 pg (ref 26.0–34.0)
MCHC: 35.3 g/dL (ref 30.0–36.0)
MCV: 94 fL (ref 78.0–100.0)
MONOS PCT: 8 % (ref 3–12)
Monocytes Absolute: 0.6 10*3/uL (ref 0.1–1.0)
Neutro Abs: 4.5 10*3/uL (ref 1.7–7.7)
Neutrophils Relative %: 62 % (ref 43–77)
PLATELETS: 153 10*3/uL (ref 150–400)
RBC: 3.65 MIL/uL — ABNORMAL LOW (ref 3.87–5.11)
RDW: 12.7 % (ref 11.5–15.5)
WBC: 7.4 10*3/uL (ref 4.0–10.5)

## 2014-04-27 LAB — URINALYSIS, ROUTINE W REFLEX MICROSCOPIC
Bilirubin Urine: NEGATIVE
GLUCOSE, UA: NEGATIVE mg/dL
Hgb urine dipstick: NEGATIVE
Ketones, ur: NEGATIVE mg/dL
Leukocytes, UA: NEGATIVE
Nitrite: NEGATIVE
Protein, ur: NEGATIVE mg/dL
Urobilinogen, UA: 2 mg/dL — ABNORMAL HIGH (ref 0.0–1.0)
pH: 8.5 — ABNORMAL HIGH (ref 5.0–8.0)

## 2014-04-27 LAB — COMPREHENSIVE METABOLIC PANEL
ALK PHOS: 92 U/L (ref 39–117)
ALT: 11 U/L (ref 0–35)
AST: 21 U/L (ref 0–37)
Albumin: 4 g/dL (ref 3.5–5.2)
Anion gap: 8 (ref 5–15)
BILIRUBIN TOTAL: 0.7 mg/dL (ref 0.3–1.2)
BUN: 11 mg/dL (ref 6–23)
CHLORIDE: 98 mmol/L (ref 96–112)
CO2: 36 mmol/L — AB (ref 19–32)
Calcium: 9.7 mg/dL (ref 8.4–10.5)
Creatinine, Ser: 0.81 mg/dL (ref 0.50–1.10)
GFR calc non Af Amer: 70 mL/min — ABNORMAL LOW (ref >90)
GFR, EST AFRICAN AMERICAN: 82 mL/min — AB (ref >90)
GLUCOSE: 126 mg/dL — AB (ref 70–99)
POTASSIUM: 2.4 mmol/L — AB (ref 3.5–5.1)
Sodium: 142 mmol/L (ref 135–145)
Total Protein: 7.2 g/dL (ref 6.0–8.3)

## 2014-04-27 LAB — CBG MONITORING, ED: Glucose-Capillary: 156 mg/dL — ABNORMAL HIGH (ref 70–99)

## 2014-04-27 LAB — LIPASE, BLOOD: LIPASE: 16 U/L (ref 11–59)

## 2014-04-27 MED ORDER — MORPHINE SULFATE 4 MG/ML IJ SOLN
4.0000 mg | Freq: Once | INTRAMUSCULAR | Status: AC
  Administered 2014-04-27: 4 mg via INTRAVENOUS
  Filled 2014-04-27: qty 1

## 2014-04-27 MED ORDER — INFLUENZA VAC SPLIT QUAD 0.5 ML IM SUSY
0.5000 mL | PREFILLED_SYRINGE | INTRAMUSCULAR | Status: AC
Start: 2014-04-28 — End: 2014-04-28
  Administered 2014-04-28: 0.5 mL via INTRAMUSCULAR
  Filled 2014-04-27 (×2): qty 0.5

## 2014-04-27 MED ORDER — POTASSIUM CHLORIDE 10 MEQ/100ML IV SOLN
10.0000 meq | INTRAVENOUS | Status: DC
  Administered 2014-04-27: 10 meq via INTRAVENOUS
  Filled 2014-04-27: qty 100

## 2014-04-27 MED ORDER — SODIUM CHLORIDE 0.9 % IV SOLN
INTRAVENOUS | Status: DC
  Administered 2014-04-27 – 2014-04-28 (×2): via INTRAVENOUS

## 2014-04-27 MED ORDER — IOHEXOL 300 MG/ML  SOLN
100.0000 mL | Freq: Once | INTRAMUSCULAR | Status: AC | PRN
  Administered 2014-04-27: 100 mL via INTRAVENOUS

## 2014-04-27 NOTE — ED Notes (Signed)
Admitting nurse at bedside PIV attempted x 3--will have ED Charge attempt

## 2014-04-27 NOTE — ED Notes (Signed)
Patient resting in position of comfort with eyes closed RR WNL--even and unlabored with equal rise and fall of chest Patient in NAD Side rails up, call bell in reach  

## 2014-04-27 NOTE — ED Notes (Signed)
Dr. Blaine Hamper made aware that patient's husband wants to speak with him
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
## 2014-04-27 NOTE — ED Notes (Signed)
MD at bedside. 

## 2014-04-27 NOTE — ED Notes (Signed)
Dr Tommas Olp informed of potassium
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
## 2014-04-27 NOTE — ED Notes (Signed)
Pt aware urine sample is needed, pt attempted on bedpan, unable.

## 2014-04-27 NOTE — ED Notes (Signed)
Patient unable to tolerate KCL IVPB infusion at 100 ml per hour as ordered MD made aware KCL IVPB rate decreased to 50 ml per hour NS infusion started, see MAR Patient able to tolerate KCL now

## 2014-04-27 NOTE — Progress Notes (Signed)
CSW contacted APS night shift worker who stated that she would have the on call APS worker call back and verify that pt has been seen by a representative.   Willette Brace [phone] ED CSW 04/27/2014 11:47 PM

## 2014-04-27 NOTE — ED Notes (Signed)
New PIV started Infusions restarted Patient with c/o pain and burning due to KCL Dr. Donna Bernard in ED MD informed of continued discomfort even at a rate of 50 ml per hour Per MD, IV KCL to be discontinued and given PO  MD made aware of serum Potassium level

## 2014-04-27 NOTE — ED Notes (Signed)
Social work still at bedside  Will start PIV and give medications when consult completed
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
## 2014-04-27 NOTE — ED Notes (Signed)
Patient's husband approached this nurse and apologized for previous behavior Husband states that he is frustrated and can no longer care for patient by himself Patient to go home, get walker and belongings and will return to hospital

## 2014-04-27 NOTE — ED Notes (Signed)
Social work at bedside to speak with patient Patient given privacy so she may speak with CSW Will start PIV and give medications after consult completed Patient remains in NAD

## 2014-04-27 NOTE — ED Notes (Signed)
NS and IV KCL stopped due to infiltration to PIV site Skin above PIV insertion site noted to be red Warm compress applied ED Charge aware

## 2014-04-27 NOTE — ED Notes (Signed)
Assumed care of patient at change of shift Patient with generalized weakness and pain r/t renal cancer Patient states that pain has returned---Dr. Regenia Skeeter made aware Upon assessment, PIV site noted to be removed Per MD, patient can eat--food and drink provided Patient repositioned in bed Side rails up, call bell in reach
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
## 2014-04-27 NOTE — H&P (Signed)
[facility] History and Physical  Valerie Sullivan OEV:[id number] DOB: [birth year] DOA: 04/27/2014  Referring physician: ED physician PCP: Angelica Chessman, MD  Specialists:   Chief Complaint: Worsening abdominal pain  HPI: Valerie Sullivan is a 74 y.o. 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 She is taking Percocet at home. She says that her husband takes and hides her medications recently. Her abdominal pain has been getting worse because of not being able to get pain medications. At her baseline, she usually has abdominal pain over the left side, but now the pain is extending to the right. She does not have nausea, vomiting, diarrhea. No fever or chills. Patient has no cough, chest pain, SOB, dysuria, urgency, frequency, hematuria, skin rashes, joint pain or leg swelling. No unilateral weakness, numbness or tingling sensations. No vision change or hearing loss.  In ED, patient was found to have negative urinalysis, hypokalemia with potassium of 2.4, normal temperature, no tachycardia, no leukocytosis. CT abdomen/pelvis is not impressive. Patient is admitted to inpatient for further evaluation treatment.  Review of Systems: As presented in the history of presenting illness, rest negative.  Where does patient live?  At home Can patient participate in ADLs? barely  Allergy: No Known Allergies  Past Medical History  Diagnosis Date  . Constipation   . Pelvic adhesions   . Diabetes mellitus   . Hypertension   . Arthritis     hips/neck  . Dementia     per spouse    Past Surgical History  Procedure Laterality Date  . Partial hysterectomy    . Uterine fibroid surgery  1992    tumor removed  . Lumbar laminectomy/decompression microdiscectomy Right 09/23/2012     Procedure: LUMBAR LAMINECTOMY/DECOMPRESSION MICRODISCECTOMY 1 LEVEL;  Surgeon: Elaina Hoops, MD;  Location: [facility];  Service: Neurosurgery;  Laterality: Right;  right lumbar five-sacral one  . Abdominal hysterectomy    . Back surgery    . Esophagogastroduodenoscopy (egd) with propofol N/A 01/10/2013    Procedure: ESOPHAGOGASTRODUODENOSCOPY (EGD) WITH PROPOFOL;  Surgeon: Garlan Fair, MD;  Location: [facility];  Service: Endoscopy;  Laterality: N/A;  . Colonoscopy with propofol N/A 01/10/2013    Procedure: COLONOSCOPY WITH PROPOFOL;  Surgeon: Garlan Fair, MD;  Location: [facility];  Service: Endoscopy;  Laterality: N/A;    Social History:  reports that she has never smoked. She has never used smokeless tobacco. She reports that she does not drink alcohol or use illicit drugs.  Family History:  Family History  Problem Relation Age of Onset  . Cancer Father     bone  . Heart disease Mother      Prior to Admission medications   Medication Sig Start Date End Date Taking? Authorizing Provider  aspirin 81 MG tablet Take 81 mg by mouth daily.    Historical Provider, MD  cetirizine (ZYRTEC) 10 MG tablet Take 10 mg by mouth daily as needed. 11/08/13   Historical Provider, MD  citalopram (CELEXA) 40 MG tablet Take 40 mg by mouth daily.    Historical Provider, MD  cyclobenzaprine (FLEXERIL) 10 MG tablet Take 10 mg by mouth 3 (three) times daily as needed for muscle spasms.  11/08/13   Historical Provider, MD  diazepam (VALIUM) 5 MG tablet Take 1 tablet (5 mg total) by mouth 2 (two) times daily.  Patient not taking: Reported on 04/27/2014 10/28/13   Glendell Docker, NP  gabapentin (NEURONTIN) 300 MG capsule Take 300 mg by mouth 3 (three) times daily.  11/08/13   Historical Provider, MD  ibuprofen (ADVIL,MOTRIN) 600 MG tablet Take 600 mg by mouth 3 (three) times daily.    Historical Provider, MD  IOPHEN C-NR 100-10 MG/5ML syrup Take 10 mLs by mouth 2 (two) times daily as needed.   11/08/13   Historical Provider, MD  meloxicam (MOBIC) 7.5 MG tablet Take 7.5 mg by mouth 2 (two) times daily as needed for pain.    Historical Provider, MD  omeprazole (PRILOSEC) 20 MG capsule Take 20 mg by mouth daily.    Historical Provider, MD  oxyCODONE-acetaminophen (PERCOCET) 10-325 MG per tablet Take 1 tablet by mouth 4 (four) times daily as needed for pain.    Historical Provider, MD  oxyCODONE-acetaminophen (PERCOCET) 7.5-325 MG per tablet Take 1 tablet by mouth 4 (four) times daily.     Historical Provider, MD  oxyCODONE-acetaminophen (PERCOCET/ROXICET) 5-325 MG per tablet Take 1 tablet by mouth every 8 (eight) hours as needed for moderate pain or severe pain. 10/28/13   Glendell Docker, NP  pantoprazole (PROTONIX) 40 MG tablet Take 40 mg by mouth daily.    Historical Provider, MD  potassium chloride SA (K-DUR,KLOR-CON) 20 MEQ tablet Take 20 mEq by mouth daily.    Historical Provider, MD  senna (SENOKOT) 8.6 MG TABS tablet Take 2 tablets by mouth daily as needed for mild constipation.    Historical Provider, MD  traZODone (DESYREL) 100 MG tablet Take 100 mg by mouth at bedtime.    Historical Provider, MD  valsartan-hydrochlorothiazide (DIOVAN-HCT) 160-12.5 MG per tablet Take 1 tablet by mouth daily.    Historical Provider, MD  Vitamin D, Ergocalciferol, (DRISDOL) 50000 UNITS CAPS capsule Take 50,000 Units by mouth every 7 (seven) days.    Historical Provider, MD  zolpidem (AMBIEN) 10 MG tablet Take 10 mg by mouth at bedtime.    Historical Provider, MD    Physical Exam: Filed Vitals:   04/27/14 2300 04/27/14 2330 04/28/14 0000 04/28/14 0107  BP:    144/75  Pulse: 66 63 68 68  Temp:    98.2 F (36.8 C)  TempSrc:    Oral  Resp: 20 19 17 20   Height:    5\' 3"  (1.6 m)  Weight:    77.7 kg (171 lb 4.8 oz)  SpO2: 100% 99% 99% 98%   General: Not in acute distress HEENT:       Eyes: PERRL, EOMI, no scleral icterus       ENT: No discharge from the ears and nose, no pharynx injection, no  tonsillar enlargement.        Neck: No JVD, no bruit, no mass felt. Cardiac: S1/S2, RRR, No murmurs, No gallops or rubs Pulm: Good air movement bilaterally. Clear to auscultation bilaterally. No rales, wheezing, rhonchi or rubs. Abd: Soft, nondistended, diffused tenderness, no rebound pain, no organomegaly, BS present Ext: No edema bilaterally. 2+DP/PT pulse bilaterally Musculoskeletal: No joint deformities, erythema, or stiffness, ROM full Skin: No rashes.  Neuro: Alert and oriented X3, cranial nerves II-XII grossly intact, muscle strength 5/5 in all extremeties, sensation to light touch intact. Brachial reflex 2+ bilaterally. Knee reflex 1+ bilaterally. Negative Babinski's sign. Normal finger to nose test. Psych: Patient is not psychotic, no suicidal or hemocidal ideation.  Labs on Admission:  Basic Metabolic Panel:  Recent Labs Lab 04/27/14 1751  NA 142  K 2.4*  CL 98  CO2 36*  GLUCOSE 126*  BUN 11  CREATININE 0.81  CALCIUM 9.7   Liver Function Tests:  Recent Labs Lab 04/27/14 1751  AST 21  ALT 11  ALKPHOS 92  BILITOT 0.7  PROT 7.2  ALBUMIN 4.0    Recent Labs Lab 04/27/14 1751  LIPASE 16   No results for input(s): AMMONIA in the last 168 hours. CBC:  Recent Labs Lab 04/27/14 1751  WBC 7.4  NEUTROABS 4.5  HGB 12.1  HCT 34.3*  MCV 94.0  PLT 153   Cardiac Enzymes: No results for input(s): CKTOTAL, CKMB, CKMBINDEX, TROPONINI in the last 168 hours.  BNP (last 3 results) No results for input(s): BNP in the last 8760 hours.  ProBNP (last 3 results) No results for input(s): PROBNP in the last 8760 hours.  CBG:  Recent Labs Lab 04/27/14 2209  GLUCAP 156*    Radiological Exams on Admission: Ct Abdomen Pelvis W Contrast  04/27/2014   CLINICAL DATA:  74 year old female with lower abdominal pain weakness and nausea. Initial encounter.  EXAM: CT ABDOMEN AND PELVIS WITH CONTRAST  TECHNIQUE: Multidetector CT imaging of the abdomen and pelvis was  performed using the standard protocol following bolus administration of intravenous contrast.  CONTRAST:  168mL OMNIPAQUE IOHEXOL 300 MG/ML  SOLN  COMPARISON:  03/30/2014 and earlier.  FINDINGS: Trace bilateral pleural effusions are new. Persistent lung base atelectasis, mildly increased. No pericardial effusion.  Advanced degenerative changes in the lower lumbar spine. No acute osseous abnormality identified.  Trace pelvic free fluid (series 2, image 70). Adnexal within normal limits. Uterus surgically absent. Diminutive bladder. Gas in the rectum.  Negative distal colon. Retained stool throughout the more proximal colon. Normal appendix. No dilated small bowel. Negative stomach. Mildly fluid distended duodenum, does not appear inflamed.  Stable liver. Gallbladder remains within normal limits. Mild central intrahepatic biliary ductal size is stable to decreased. The CBD appears within normal limits. Spleen, pancreas (atrophy), and adrenal glands are within normal limits. Portal venous system is patent. Mildly ectatic aorta re- identified. Major arterial structures in the abdomen and pelvis are patent. Kidney in hands min to and contrast excretion is within normal limits. No abdominal free fluid. No lymphadenopathy.  IMPRESSION: 1. Trace pelvic free fluid is abnormal but nonspecific. No associated inflammatory process identified in the abdomen or pelvis. Normal appendix. 2. New trace pleural effusions.  Mildly increased atelectasis.   Electronically Signed   By: Genevie Ann M.D.   On: 04/27/2014 18:57    EKG: Independently reviewed. Has U wave on EKG  Assessment/Plan Principal Problem:   Hypokalemia Active Problems:   Pelvic adhesions   Arthritis   HTN (hypertension)   Right kidney mass   Dementia   GERD (gastroesophageal reflux disease)   Depression   Abdominal pain  Hypokalemia: Potassium 2.4, likely due to decreased oral intake secondary to abdominal pain. Has U wave on EKG in V2  -Repleted with  40 mEq of potassium chloride 2 by oral, plus 10 mEq of potassium chloride x 4 by IV -repeat BMP in AM and check Mg level  Abdominal pain: This is chronic issue. CT abdomen/pelvis has no acute problems. It is most likely due to being unable to take pain medication since his husband took his medications away. -start morphine prn -Resume home Percocet  -Consult to social work  GERD: -Protonix  Hypertension: -Irbesartan plus hydrochlorothiazide  Arthritis: Patient is on both ibuprofen and mobic at home. - Discontinue mobic -continue ibuprofen  DVT ppx: SQ Heparin   Code Status: Full code Family Communication: None at bed side.      Disposition Plan: Admit to inpatient   Date of Service 04/28/2014    Ivor Costa [facility] Pager [phone]  If 7PM-7AM, please contact night-coverage [email or web address] Password [email or web address] 04/28/2014, 4:02 AM

## 2014-04-27 NOTE — ED Notes (Signed)
CSW at bedside.

## 2014-04-28 DIAGNOSIS — I10 Essential (primary) hypertension: Secondary | ICD-10-CM

## 2014-04-28 DIAGNOSIS — M1990 Unspecified osteoarthritis, unspecified site: Secondary | ICD-10-CM

## 2014-04-28 DIAGNOSIS — E876 Hypokalemia: Secondary | ICD-10-CM | POA: Diagnosis not present

## 2014-04-28 LAB — COMPREHENSIVE METABOLIC PANEL
ALBUMIN: 3.5 g/dL (ref 3.5–5.2)
ALT: 9 U/L (ref 0–35)
AST: 26 U/L (ref 0–37)
Alkaline Phosphatase: 80 U/L (ref 39–117)
Anion gap: 8 (ref 5–15)
BILIRUBIN TOTAL: 0.8 mg/dL (ref 0.3–1.2)
BUN: 12 mg/dL (ref 6–23)
CALCIUM: 8.9 mg/dL (ref 8.4–10.5)
CO2: 34 mmol/L — ABNORMAL HIGH (ref 19–32)
CREATININE: 0.97 mg/dL (ref 0.50–1.10)
Chloride: 100 mmol/L (ref 96–112)
GFR calc non Af Amer: 57 mL/min — ABNORMAL LOW (ref >90)
GFR, EST AFRICAN AMERICAN: 66 mL/min — AB (ref >90)
Glucose, Bld: 153 mg/dL — ABNORMAL HIGH (ref 70–99)
Potassium: 2.7 mmol/L — CL (ref 3.5–5.1)
Sodium: 142 mmol/L (ref 135–145)
Total Protein: 6.4 g/dL (ref 6.0–8.3)

## 2014-04-28 LAB — MAGNESIUM: Magnesium: 1.7 mg/dL (ref 1.5–2.5)

## 2014-04-28 LAB — BASIC METABOLIC PANEL
ANION GAP: 4 — AB (ref 5–15)
BUN: 12 mg/dL (ref 6–23)
CALCIUM: 8.4 mg/dL (ref 8.4–10.5)
CO2: 31 mmol/L (ref 19–32)
Chloride: 103 mmol/L (ref 96–112)
Creatinine, Ser: 0.95 mg/dL (ref 0.50–1.10)
GFR calc Af Amer: 67 mL/min — ABNORMAL LOW (ref >90)
GFR calc non Af Amer: 58 mL/min — ABNORMAL LOW (ref >90)
Glucose, Bld: 173 mg/dL — ABNORMAL HIGH (ref 70–99)
Potassium: 3.3 mmol/L — ABNORMAL LOW (ref 3.5–5.1)
Sodium: 138 mmol/L (ref 135–145)

## 2014-04-28 LAB — CBC
HCT: 33.7 % — ABNORMAL LOW (ref 36.0–46.0)
Hemoglobin: 11.4 g/dL — ABNORMAL LOW (ref 12.0–15.0)
MCH: 32.4 pg (ref 26.0–34.0)
MCHC: 33.8 g/dL (ref 30.0–36.0)
MCV: 95.7 fL (ref 78.0–100.0)
PLATELETS: 153 10*3/uL (ref 150–400)
RBC: 3.52 MIL/uL — ABNORMAL LOW (ref 3.87–5.11)
RDW: 13 % (ref 11.5–15.5)
WBC: 7.6 10*3/uL (ref 4.0–10.5)

## 2014-04-28 LAB — GLUCOSE, CAPILLARY
GLUCOSE-CAPILLARY: 136 mg/dL — AB (ref 70–99)
GLUCOSE-CAPILLARY: 142 mg/dL — AB (ref 70–99)

## 2014-04-28 MED ORDER — POTASSIUM CHLORIDE CRYS ER 20 MEQ PO TBCR
40.0000 meq | EXTENDED_RELEASE_TABLET | ORAL | Status: AC
  Administered 2014-04-28 (×2): 40 meq via ORAL
  Filled 2014-04-28 (×2): qty 2

## 2014-04-28 MED ORDER — SODIUM CHLORIDE 0.9 % IJ SOLN: 3.0000 mL | Freq: Two times a day (BID) | INTRAMUSCULAR | Status: DC

## 2014-04-28 MED ORDER — IRBESARTAN 150 MG PO TABS
150.0000 mg | ORAL_TABLET | Freq: Every day | ORAL | Status: DC
  Administered 2014-04-28 – 2014-05-01 (×4): 150 mg via ORAL
  Filled 2014-04-28 (×4): qty 1

## 2014-04-28 MED ORDER — GABAPENTIN 300 MG PO CAPS
300.0000 mg | ORAL_CAPSULE | Freq: Three times a day (TID) | ORAL | Status: DC
  Administered 2014-04-28 – 2014-04-30 (×7): 300 mg via ORAL
  Filled 2014-04-28 (×7): qty 1

## 2014-04-28 MED ORDER — POTASSIUM CHLORIDE CRYS ER 20 MEQ PO TBCR
40.0000 meq | EXTENDED_RELEASE_TABLET | Freq: Once | ORAL | Status: AC
  Administered 2014-04-28: 40 meq via ORAL
  Filled 2014-04-28: qty 2

## 2014-04-28 MED ORDER — OXYCODONE HCL 5 MG PO TABS
5.0000 mg | ORAL_TABLET | Freq: Four times a day (QID) | ORAL | Status: DC | PRN
  Administered 2014-04-28 – 2014-05-01 (×4): 5 mg via ORAL
  Filled 2014-04-28 (×4): qty 1

## 2014-04-28 MED ORDER — ZOLPIDEM TARTRATE 5 MG PO TABS
5.0000 mg | ORAL_TABLET | Freq: Every day | ORAL | Status: DC
  Administered 2014-04-28 – 2014-04-29 (×2): 5 mg via ORAL
  Filled 2014-04-28 (×2): qty 1

## 2014-04-28 MED ORDER — ASPIRIN 81 MG PO CHEW
81.0000 mg | CHEWABLE_TABLET | Freq: Every day | ORAL | Status: DC
  Administered 2014-04-28 – 2014-05-01 (×4): 81 mg via ORAL
  Filled 2014-04-28 (×4): qty 1

## 2014-04-28 MED ORDER — TRAZODONE HCL 50 MG PO TABS
100.0000 mg | ORAL_TABLET | Freq: Every day | ORAL | Status: DC
  Administered 2014-04-28 – 2014-04-29 (×2): 100 mg via ORAL
  Filled 2014-04-28 (×3): qty 2

## 2014-04-28 MED ORDER — IBUPROFEN 200 MG PO TABS
600.0000 mg | ORAL_TABLET | Freq: Three times a day (TID) | ORAL | Status: DC
  Administered 2014-04-28 – 2014-05-01 (×10): 600 mg via ORAL
  Filled 2014-04-28 (×10): qty 3

## 2014-04-28 MED ORDER — PANTOPRAZOLE SODIUM 40 MG PO TBEC
40.0000 mg | DELAYED_RELEASE_TABLET | Freq: Every day | ORAL | Status: DC
  Administered 2014-04-28 – 2014-05-01 (×4): 40 mg via ORAL
  Filled 2014-04-28 (×4): qty 1

## 2014-04-28 MED ORDER — POTASSIUM CHLORIDE CRYS ER 20 MEQ PO TBCR
30.0000 meq | EXTENDED_RELEASE_TABLET | ORAL | Status: AC
  Administered 2014-04-28 (×2): 30 meq via ORAL
  Filled 2014-04-28 (×4): qty 1

## 2014-04-28 MED ORDER — VALSARTAN-HYDROCHLOROTHIAZIDE 160-12.5 MG PO TABS: 1.0000 | ORAL_TABLET | Freq: Every day | ORAL | Status: DC

## 2014-04-28 MED ORDER — CYCLOBENZAPRINE HCL 10 MG PO TABS: 10.0000 mg | ORAL_TABLET | Freq: Three times a day (TID) | ORAL | Status: DC | PRN

## 2014-04-28 MED ORDER — OXYCODONE-ACETAMINOPHEN 5-325 MG PO TABS
1.0000 | ORAL_TABLET | Freq: Four times a day (QID) | ORAL | Status: DC | PRN
  Administered 2014-04-28 – 2014-05-01 (×4): 1 via ORAL
  Filled 2014-04-28 (×4): qty 1

## 2014-04-28 MED ORDER — SENNA 8.6 MG PO TABS: 2.0000 | ORAL_TABLET | Freq: Every day | ORAL | Status: DC | PRN

## 2014-04-28 MED ORDER — GUAIFENESIN-CODEINE 100-10 MG/5ML PO SOLN
10.0000 mL | Freq: Two times a day (BID) | ORAL | Status: DC | PRN
  Administered 2014-04-30: 10 mL via ORAL
  Filled 2014-04-28: qty 10

## 2014-04-28 MED ORDER — OXYCODONE-ACETAMINOPHEN 10-325 MG PO TABS: 1.0000 | ORAL_TABLET | Freq: Four times a day (QID) | ORAL | Status: DC | PRN

## 2014-04-28 MED ORDER — ZOLPIDEM TARTRATE 10 MG PO TABS
10.0000 mg | ORAL_TABLET | Freq: Every day | ORAL | Status: DC
Start: 2014-04-28 — End: 2014-04-28

## 2014-04-28 MED ORDER — HEPARIN SODIUM (PORCINE) 5000 UNIT/ML IJ SOLN
5000.0000 [IU] | Freq: Three times a day (TID) | INTRAMUSCULAR | Status: DC
  Administered 2014-04-28 – 2014-05-01 (×10): 5000 [IU] via SUBCUTANEOUS
  Filled 2014-04-28 (×10): qty 1

## 2014-04-28 MED ORDER — POTASSIUM CHLORIDE IN NACL 20-0.9 MEQ/L-% IV SOLN
INTRAVENOUS | Status: DC
  Administered 2014-04-28 – 2014-05-01 (×6): via INTRAVENOUS
  Filled 2014-04-28 (×7): qty 1000

## 2014-04-28 MED ORDER — DOCUSATE SODIUM 100 MG PO CAPS
100.0000 mg | ORAL_CAPSULE | Freq: Two times a day (BID) | ORAL | Status: DC
  Administered 2014-04-28 – 2014-05-01 (×7): 100 mg via ORAL
  Filled 2014-04-28 (×8): qty 1

## 2014-04-28 MED ORDER — ONDANSETRON HCL 4 MG/2ML IJ SOLN: 4.0000 mg | Freq: Three times a day (TID) | INTRAMUSCULAR | Status: DC | PRN

## 2014-04-28 MED ORDER — CITALOPRAM HYDROBROMIDE 20 MG PO TABS
40.0000 mg | ORAL_TABLET | Freq: Every day | ORAL | Status: DC
  Administered 2014-04-28 – 2014-04-29 (×2): 40 mg via ORAL
  Filled 2014-04-28 (×2): qty 2

## 2014-04-28 MED ORDER — HYDROCHLOROTHIAZIDE 12.5 MG PO CAPS
12.5000 mg | ORAL_CAPSULE | Freq: Every day | ORAL | Status: DC
  Administered 2014-04-28: 12.5 mg via ORAL
  Filled 2014-04-28: qty 1

## 2014-04-28 MED ORDER — MAGNESIUM OXIDE 400 (241.3 MG) MG PO TABS
400.0000 mg | ORAL_TABLET | Freq: Two times a day (BID) | ORAL | Status: DC
  Administered 2014-04-28 – 2014-05-01 (×7): 400 mg via ORAL
  Filled 2014-04-28 (×7): qty 1

## 2014-04-28 MED ORDER — LORATADINE 10 MG PO TABS
10.0000 mg | ORAL_TABLET | Freq: Every day | ORAL | Status: DC
  Administered 2014-04-28 – 2014-05-01 (×4): 10 mg via ORAL
  Filled 2014-04-28 (×4): qty 1

## 2014-04-28 MED ORDER — MORPHINE SULFATE 2 MG/ML IJ SOLN: 2.0000 mg | INTRAMUSCULAR | Status: DC | PRN

## 2014-04-28 MED ORDER — POLYETHYLENE GLYCOL 3350 17 G PO PACK
17.0000 g | PACK | Freq: Every day | ORAL | Status: DC
  Administered 2014-04-28 – 2014-05-01 (×4): 17 g via ORAL
  Filled 2014-04-28 (×4): qty 1

## 2014-04-28 MED ORDER — POTASSIUM CHLORIDE 20 MEQ/15ML (10%) PO SOLN
40.0000 meq | Freq: Once | ORAL | Status: AC
  Administered 2014-04-28: 40 meq via ORAL
  Filled 2014-04-28: qty 30

## 2014-04-28 NOTE — Progress Notes (Signed)
CRITICAL VALUE ALERT  Critical value received:  K+ 2.7  Date of notification:  04/28/2014  Time of notification:  06:50  Critical value read back:Yes.    Nurse who received alert:  Ruben Im, RN  MD notified (1st page):  M. Donnal Debar, NP  Time of first page:  06:50  MD notified (2nd page):  Time of second page:  Responding MD:  M. Donnal Debar, NP  Time MD responded:  06:55

## 2014-04-28 NOTE — Progress Notes (Signed)
[facility] PROGRESS NOTE  Valerie Sullivan QIW:[id number] DOB: 24-Nov-1940 DOA: 04/27/2014  PCP: Angelica Chessman, MD  Brief HPI: 74 year old African-American female presented with abdominal pain. 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 She was found to be severely hypokalemic and was subsequently admitted.  Past medical history:  Past Medical History  Diagnosis Date  . Constipation   . Pelvic adhesions   . Diabetes mellitus   . Hypertension   . Arthritis     hips/neck  . Dementia     per spouse    Consultants: None  Procedures: None  Antibiotics: None  Subjective: Patient continues to complain of lower abdominal pain. Denies any other complaints. No nausea, vomiting. Tolerating her diet currently.  Objective: Vital Signs  Filed Vitals:   04/27/14 2330 04/28/14 0000 04/28/14 0107 04/28/14 0522  BP:   144/75 125/71  Pulse: 63 68 68 64  Temp:   98.2 F (36.8 C) 97.7 F (36.5 C)  TempSrc:   Oral Oral  Resp: 19 17 20 20   Height:   5\' 3"  (1.6 m)   Weight:   77.7 kg (171 lb 4.8 oz)   SpO2: 99% 99% 98% 94%    Intake/Output Summary (Last 24 hours) at 04/28/14 4174 Last data filed at 04/28/14 0715  Gross per 24 hour  Intake    955 ml  Output    200 ml  Net    755 ml   Filed Weights   04/27/14 1600 04/28/14 0107  Weight: 80.287 kg (177 lb) 77.7 kg (171 lb 4.8 oz)    General appearance: alert, cooperative, appears stated age and no distress Resp: clear to auscultation bilaterally Cardio: regular rate and rhythm, S1, S2 normal, no murmur, click, rub or gallop GI: Abdomen is soft. Mild tenderness in the lower abdomen without any rebound, rigidity or guarding. No masses or organomegaly. Extremities: extremities normal, atraumatic, no cyanosis or edema Neurologic: Cranial nerves II-12 intact. She is alert and oriented 3. No focal neurological deficits noted.  Lab Results:  Basic Metabolic Panel:  Recent  Labs Lab 04/27/14 1751 04/28/14 0533  NA 142 142  K 2.4* 2.7*  CL 98 100  CO2 36* 34*  GLUCOSE 126* 153*  BUN 11 12  CREATININE 0.81 0.97  CALCIUM 9.7 8.9  MG  --  1.7   Liver Function Tests:  Recent Labs Lab 04/27/14 1751 04/28/14 0533  AST 21 26  ALT 11 9  ALKPHOS 92 80  BILITOT 0.7 0.8  PROT 7.2 6.4  ALBUMIN 4.0 3.5    Recent Labs Lab 04/27/14 1751  LIPASE 16   CBC:  Recent Labs Lab 04/27/14 1751 04/28/14 0533  WBC 7.4 7.6  NEUTROABS 4.5  --   HGB 12.1 11.4*  HCT 34.3* 33.7*  MCV 94.0 95.7  PLT 153 153   CBG:  Recent Labs Lab 04/27/14 2209 04/28/14 0813 04/28/14 0833  GLUCAP 156* 136* 142*    Studies/Results: Ct Abdomen Pelvis W Contrast  04/27/2014   CLINICAL DATA:  74 year old female with lower abdominal pain weakness and nausea. Initial encounter.  EXAM: CT ABDOMEN AND PELVIS WITH CONTRAST  TECHNIQUE: Multidetector CT imaging of the abdomen and pelvis was performed using the standard protocol following bolus administration of intravenous contrast.  CONTRAST:  189mL OMNIPAQUE IOHEXOL 300 MG/ML  SOLN  COMPARISON:  03/30/2014 and earlier.  FINDINGS: Trace bilateral pleural effusions are new. Persistent lung base atelectasis, mildly increased. No pericardial effusion.  Advanced  degenerative changes in the lower lumbar spine. No acute osseous abnormality identified.  Trace pelvic free fluid (series 2, image 70). Adnexal within normal limits. Uterus surgically absent. Diminutive bladder. Gas in the rectum.  Negative distal colon. Retained stool throughout the more proximal colon. Normal appendix. No dilated small bowel. Negative stomach. Mildly fluid distended duodenum, does not appear inflamed.  Stable liver. Gallbladder remains within normal limits. Mild central intrahepatic biliary ductal size is stable to decreased. The CBD appears within normal limits. Spleen, pancreas (atrophy), and adrenal glands are within normal limits. Portal venous system is  patent. Mildly ectatic aorta re- identified. Major arterial structures in the abdomen and pelvis are patent. Kidney in hands min to and contrast excretion is within normal limits. No abdominal free fluid. No lymphadenopathy.  IMPRESSION: 1. Trace pelvic free fluid is abnormal but nonspecific. No associated inflammatory process identified in the abdomen or pelvis. Normal appendix. 2. New trace pleural effusions.  Mildly increased atelectasis.   Electronically Signed   By: Genevie Ann M.D.   On: 04/27/2014 18:57    Medications:  Scheduled: . aspirin  81 mg Oral Daily  . citalopram  40 mg Oral Daily  . docusate sodium  100 mg Oral BID  . gabapentin  300 mg Oral TID  . heparin  5,000 Units Subcutaneous 3 times per day  . ibuprofen  600 mg Oral TID  . irbesartan  150 mg Oral Daily  . loratadine  10 mg Oral Daily  . magnesium oxide  400 mg Oral BID  . pantoprazole  40 mg Oral Daily  . polyethylene glycol  17 g Oral Daily  . sodium chloride  3 mL Intravenous Q12H  . traZODone  100 mg Oral QHS  . zolpidem  5 mg Oral QHS   Continuous: . 0.9 % NaCl with KCl 20 mEq / L 75 mL/hr at 04/28/14 1106   TGG:YIRSWNIOEVOJJKK, guaiFENesin-codeine, morphine injection, ondansetron, oxyCODONE-acetaminophen **AND** oxyCODONE, senna  Assessment/Plan:  Principal Problem:   Hypokalemia Active Problems:   Pelvic adhesions   Arthritis   HTN (hypertension)   Right kidney mass   Dementia   GERD (gastroesophageal reflux disease)   Depression   Abdominal pain    Severe Hypokalemia Potassium continues to be low this morning. This will be aggressively repleted. Start magnesium oxide as well. Repeat blood work this afternoon. Low potassium likely due to poor oral intake as well as use of diuretics.  Chronic Abdominal pain This is chronic issue. CT abdomen/pelvis has no acute problems. UA was done and was negative for UTI. Lipase was normal. LFTs are normal. It is most likely due to being unable to take pain  medication since the husband took her medications away. She will need to be prescribed 2 tablets of her pain medications. Social worker to address home situation.  GERD: Continue Protonix  Essential Hypertension: Continue irbesartan. Hold her HCTZ.   Arthritis Stable. Her mobic was discontinued.  DVT Prophylaxis: Heparin    Code Status: Full code  Family Communication: Discussed with the patient  Disposition Plan: PT and OT to evaluate. Social worker to see. Discharge when medically ready.    LOS: 1 day   [place] [facility] Pager [phone] 04/28/2014, 9:24 AM  If 7PM-7AM, please contact night-coverage at [email or web address], password [facility]

## 2014-04-28 NOTE — ED Notes (Signed)
Patient informed of order for PO KCL Patient refusing medication at this time Patient's husband stated that he helps with medications at home and is due to return to hospital Will have husband encourage patient to take ordered medications Patient remains in NAD

## 2014-04-29 DIAGNOSIS — K219 Gastro-esophageal reflux disease without esophagitis: Secondary | ICD-10-CM

## 2014-04-29 LAB — BASIC METABOLIC PANEL
Anion gap: 4 — ABNORMAL LOW (ref 5–15)
BUN: 10 mg/dL (ref 6–23)
CALCIUM: 8.6 mg/dL (ref 8.4–10.5)
CO2: 28 mmol/L (ref 19–32)
Chloride: 110 mmol/L (ref 96–112)
Creatinine, Ser: 0.79 mg/dL (ref 0.50–1.10)
GFR calc non Af Amer: 81 mL/min — ABNORMAL LOW (ref >90)
Glucose, Bld: 119 mg/dL — ABNORMAL HIGH (ref 70–99)
Potassium: 4.4 mmol/L (ref 3.5–5.1)
SODIUM: 142 mmol/L (ref 135–145)

## 2014-04-29 LAB — CBC
HCT: 30.7 % — ABNORMAL LOW (ref 36.0–46.0)
HEMOGLOBIN: 10.4 g/dL — AB (ref 12.0–15.0)
MCH: 33 pg (ref 26.0–34.0)
MCHC: 33.9 g/dL (ref 30.0–36.0)
MCV: 97.5 fL (ref 78.0–100.0)
PLATELETS: 138 10*3/uL — AB (ref 150–400)
RBC: 3.15 MIL/uL — AB (ref 3.87–5.11)
RDW: 13.3 % (ref 11.5–15.5)
WBC: 6.4 10*3/uL (ref 4.0–10.5)

## 2014-04-29 LAB — GLUCOSE, CAPILLARY: GLUCOSE-CAPILLARY: 124 mg/dL — AB (ref 70–99)

## 2014-04-29 LAB — MAGNESIUM: Magnesium: 1.8 mg/dL (ref 1.5–2.5)

## 2014-04-29 MED ORDER — MAGNESIUM OXIDE 400 (241.3 MG) MG PO TABS: 400.0000 mg | ORAL_TABLET | Freq: Two times a day (BID) | ORAL | Status: AC

## 2014-04-29 MED ORDER — CITALOPRAM HYDROBROMIDE 20 MG PO TABS
20.0000 mg | ORAL_TABLET | Freq: Every day | ORAL | Status: DC
  Administered 2014-04-30 – 2014-05-01 (×2): 20 mg via ORAL
  Filled 2014-04-29 (×2): qty 1

## 2014-04-29 MED ORDER — AMLODIPINE BESYLATE 5 MG PO TABS: 5.0000 mg | ORAL_TABLET | Freq: Every day | ORAL | Status: DC

## 2014-04-29 MED ORDER — DOCUSATE SODIUM 100 MG PO CAPS: 100.0000 mg | ORAL_CAPSULE | Freq: Two times a day (BID) | ORAL | Status: AC

## 2014-04-29 MED ORDER — VALSARTAN 160 MG PO TABS: 160.0000 mg | ORAL_TABLET | Freq: Every day | ORAL | Status: DC

## 2014-04-29 NOTE — Progress Notes (Signed)
[facility] PROGRESS NOTE  Valerie Sullivan UJW:[id number] DOB: 06-Oct-1940 DOA: 04/27/2014  PCP: Angelica Chessman, MD  Brief HPI: 74 year old African-American female presented with abdominal pain. 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 She was found to be severely hypokalemic and was subsequently admitted.  Past medical history:  Past Medical History  Diagnosis Date  . Constipation   . Pelvic adhesions   . Diabetes mellitus   . Hypertension   . Arthritis     hips/neck  . Dementia     per spouse    Consultants: None  Procedures: None  Antibiotics: None  Subjective: Patient feels better today. Still has this lower abdominal pain which is chronic.   Objective: Vital Signs  Filed Vitals:   04/28/14 0522 04/28/14 1402 04/28/14 2136 04/29/14 0630  BP: 125/71 129/74 132/72 150/72  Pulse: 64 68 64 63  Temp: 97.7 F (36.5 C) 98.4 F (36.9 C) 98.9 F (37.2 C) 98.1 F (36.7 C)  TempSrc: Oral Axillary Oral Oral  Resp: 20 19 18 18   Height:      Weight:      SpO2: 94% 97% 95% 94%    Intake/Output Summary (Last 24 hours) at 04/29/14 0935 Last data filed at 04/29/14 0856  Gross per 24 hour  Intake 2114.58 ml  Output    825 ml  Net 1289.58 ml   Filed Weights   04/27/14 1600 04/28/14 0107  Weight: 80.287 kg (177 lb) 77.7 kg (171 lb 4.8 oz)    General appearance: alert, cooperative, appears stated age and no distress Resp: clear to auscultation bilaterally Cardio: regular rate and rhythm, S1, S2 normal, no murmur, click, rub or gallop GI: Abdomen is soft. Mild tenderness in the lower abdomen without any rebound, rigidity or guarding. No masses or organomegaly. Extremities: extremities normal, atraumatic, no cyanosis or edema Neurologic: Cranial nerves II-12 intact. She is alert and oriented 3. No focal neurological deficits noted.  Lab Results:  Basic Metabolic Panel:  Recent Labs Lab 04/27/14 1751  04/28/14 0533 04/28/14 1405 04/29/14 0504  NA 142 142 138 142  K 2.4* 2.7* 3.3* 4.4  CL 98 100 103 110  CO2 36* 34* 31 28  GLUCOSE 126* 153* 173* 119*  BUN 11 12 12 10   CREATININE 0.81 0.97 0.95 0.79  CALCIUM 9.7 8.9 8.4 8.6  MG  --  1.7  --  1.8   Liver Function Tests:  Recent Labs Lab 04/27/14 1751 04/28/14 0533  AST 21 26  ALT 11 9  ALKPHOS 92 80  BILITOT 0.7 0.8  PROT 7.2 6.4  ALBUMIN 4.0 3.5    Recent Labs Lab 04/27/14 1751  LIPASE 16   CBC:  Recent Labs Lab 04/27/14 1751 04/28/14 0533 04/29/14 0504  WBC 7.4 7.6 6.4  NEUTROABS 4.5  --   --   HGB 12.1 11.4* 10.4*  HCT 34.3* 33.7* 30.7*  MCV 94.0 95.7 97.5  PLT 153 153 138*   CBG:  Recent Labs Lab 04/27/14 2209 04/28/14 0813 04/28/14 0833 04/29/14 0749  GLUCAP 156* 136* 142* 124*    Studies/Results: Ct Abdomen Pelvis W Contrast  04/27/2014   CLINICAL DATA:  74 year old female with lower abdominal pain weakness and nausea. Initial encounter.  EXAM: CT ABDOMEN AND PELVIS WITH CONTRAST  TECHNIQUE: Multidetector CT imaging of the abdomen and pelvis was performed using the standard protocol following bolus administration of intravenous contrast.  CONTRAST:  184mL OMNIPAQUE IOHEXOL 300 MG/ML  SOLN  COMPARISON:  03/30/2014 and earlier.  FINDINGS: Trace bilateral pleural effusions are new. Persistent lung base atelectasis, mildly increased. No pericardial effusion.  Advanced degenerative changes in the lower lumbar spine. No acute osseous abnormality identified.  Trace pelvic free fluid (series 2, image 70). Adnexal within normal limits. Uterus surgically absent. Diminutive bladder. Gas in the rectum.  Negative distal colon. Retained stool throughout the more proximal colon. Normal appendix. No dilated small bowel. Negative stomach. Mildly fluid distended duodenum, does not appear inflamed.  Stable liver. Gallbladder remains within normal limits. Mild central intrahepatic biliary ductal size is stable to  decreased. The CBD appears within normal limits. Spleen, pancreas (atrophy), and adrenal glands are within normal limits. Portal venous system is patent. Mildly ectatic aorta re- identified. Major arterial structures in the abdomen and pelvis are patent. Kidney in hands min to and contrast excretion is within normal limits. No abdominal free fluid. No lymphadenopathy.  IMPRESSION: 1. Trace pelvic free fluid is abnormal but nonspecific. No associated inflammatory process identified in the abdomen or pelvis. Normal appendix. 2. New trace pleural effusions.  Mildly increased atelectasis.   Electronically Signed   By: Genevie Ann M.D.   On: 04/27/2014 18:57    Medications:  Scheduled: . aspirin  81 mg Oral Daily  . citalopram  40 mg Oral Daily  . docusate sodium  100 mg Oral BID  . gabapentin  300 mg Oral TID  . heparin  5,000 Units Subcutaneous 3 times per day  . ibuprofen  600 mg Oral TID  . irbesartan  150 mg Oral Daily  . loratadine  10 mg Oral Daily  . magnesium oxide  400 mg Oral BID  . pantoprazole  40 mg Oral Daily  . polyethylene glycol  17 g Oral Daily  . sodium chloride  3 mL Intravenous Q12H  . traZODone  100 mg Oral QHS  . zolpidem  5 mg Oral QHS   Continuous: . 0.9 % NaCl with KCl 20 mEq / L 75 mL/hr at 04/28/14 2331   YTK:PTWSFKCLEXNTZGY, guaiFENesin-codeine, morphine injection, ondansetron, oxyCODONE-acetaminophen **AND** oxyCODONE, senna  Assessment/Plan:  Principal Problem:   Hypokalemia Active Problems:   Pelvic adhesions   Arthritis   HTN (hypertension)   Right kidney mass   Dementia   GERD (gastroesophageal reflux disease)   Depression   Abdominal pain    Severe Hypokalemia Potassium is finally improved this morning. Continue with magnesium oxide. We will discontinue her diuretic, which is the reason for her hypokalemia.   Chronic Abdominal pain This is a chronic issue. CT abdomen/pelvis reveals no acute problems. UA was done and was negative for UTI. Lipase  was normal. LFTs are normal. It is most likely due to being unable to take pain medication since the husband took her medications away. Apparently she may have unsafe home environment as well.  GERD: Continue Protonix  Essential Hypertension: Continue irbesartan. Discontinue her HCTZ. Can consider amlodipine if blood pressure remains poorly controlled  Arthritis Stable. Her mobic was discontinued.  DVT Prophylaxis: Heparin    Code Status: Full code  Family Communication: Discussed with the patient  Disposition Plan: PT and OT to evaluate. Can be discharged if cleared by them. Otherwise, will need skilled nursing facility placement. Social worker to see.    LOS: 2 days   [place] [facility] Pager [phone] 04/29/2014, 9:35 AM  If 7PM-7AM, please contact night-coverage at [email or web address], password [facility]

## 2014-04-29 NOTE — Evaluation (Signed)
Physical Therapy Evaluation Patient Details Name: Valerie Sullivan MRN: [id number] DOB: 1940-07-08 Today's Date: 04/29/2014   History of Present Illness  Valerie Sullivan is a 74 y.o. female admitted 04/27/14 with c/o abdominal pain, has  past medical history of chronic abdominal pain secondary to pelvis adhesion, hypertension, hyperlipidemia, GERD, dementia, diabetes mellitus, who presents with worsening abdominal pain  Clinical Impression  Patient mobilizes very slowly. Patient may benefit from ST snf if spouse unable to provide  Level of care needed at this time. Spouse not present, wife states that  she will discuss with him. RN aware if current   Needs/ recommendations. Patient will benefit from PT to address problems listed in note below.    Follow Up Recommendations SNF;Supervision/Assistance - 24 hour (RN reports that spouse  has some phuysical challenges.)    Equipment Recommendations  None recommended by PT    Recommendations for Other Services       Precautions / Restrictions Precautions Precautions: Fall      Mobility  Bed Mobility Overal bed mobility: Needs Assistance Bed Mobility: Supine to Sit     Supine to sit: Supervision     General bed mobility comments: extra time, moves slowly  Transfers Overall transfer level: Needs assistance Equipment used: Rolling walker (2 wheeled) Transfers: Sit to/from Stand Sit to Stand: Min assist         General transfer comment: moves very slowly, extra time to complete task.  Ambulation/Gait Ambulation/Gait assistance: Mod assist Ambulation Distance (Feet): 10 Feet Assistive device: Rolling walker (2 wheeled)   Gait velocity: very slow   General Gait Details: cues for posture, tends to lean forward, RW ahead.  moves very slowly.  Stairs            Wheelchair Mobility    Modified Rankin (Stroke Patients Only)       Balance                                             Pertinent  Vitals/Pain Pain Assessment: Faces Faces Pain Scale: Hurts even more Pain Location: abdomen Pain Descriptors / Indicators: Discomfort Pain Intervention(s): Patient requesting pain meds-RN notified;Monitored during session    [place] expects to be discharged to:: Private residence Living Arrangements: Spouse/significant other Available Help at Discharge: Family Type of Home: Apartment Home Access: Stairs to enter Entrance Stairs-Rails: None Entrance Stairs-Number of Steps: 3 Home Layout: One level Home Equipment: Environmental consultant - 2 wheels;Walker - 4 wheels      Prior Function Level of Independence: Needs assistance      ADL's / Homemaking Assistance Needed: husband helped with bathing, dressing        Hand Dominance        Extremity/Trunk Assessment   Upper Extremity Assessment: Generalized weakness           Lower Extremity Assessment: Generalized weakness         Communication   Communication: No difficulties  Cognition Arousal/Alertness: Awake/alert Behavior During Therapy: WFL for tasks assessed/performed Overall Cognitive Status: Within Functional Limits for tasks assessed                      General Comments      Exercises        Assessment/Plan    PT Assessment Patient needs continued PT services  PT Diagnosis Difficulty walking;Generalized weakness;Acute pain  PT Problem List Decreased strength;Decreased activity tolerance;Decreased mobility;Decreased knowledge of precautions;Decreased safety awareness;Decreased knowledge of use of DME  PT Treatment Interventions DME instruction;Gait training;Functional mobility training;Therapeutic activities;Therapeutic exercise   PT Goals (Current goals can be found in the Care Plan section) Acute Rehab PT Goals Patient Stated Goal: to get stronger PT Goal Formulation: With patient Time For Goal Achievement: 05/13/14 Potential to Achieve Goals: Good    Frequency Min 3X/week    Barriers to discharge Decreased caregiver support      Co-evaluation               End of Session   Activity Tolerance: Patient limited by fatigue Patient left: in chair;with call bell/phone within reach;with chair alarm set;with nursing/sitter in room Nurse Communication: Mobility status         Time: 0258-5277 PT Time Calculation (min) (ACUTE ONLY): 29 min   Charges:   PT Evaluation $Initial PT Evaluation Tier I: 1 Procedure PT Treatments $Gait Training: 8-22 mins   PT G Codes:        Claretha Cooper 04/29/2014, 11:57 AM Tresa Endo PT [phone]

## 2014-04-30 LAB — BASIC METABOLIC PANEL
Anion gap: 3 — ABNORMAL LOW (ref 5–15)
BUN: 11 mg/dL (ref 6–23)
CALCIUM: 8.8 mg/dL (ref 8.4–10.5)
CHLORIDE: 112 mmol/L (ref 96–112)
CO2: 26 mmol/L (ref 19–32)
Creatinine, Ser: 0.93 mg/dL (ref 0.50–1.10)
GFR calc Af Amer: 69 mL/min — ABNORMAL LOW (ref >90)
GFR calc non Af Amer: 60 mL/min — ABNORMAL LOW (ref >90)
Glucose, Bld: 141 mg/dL — ABNORMAL HIGH (ref 70–99)
Potassium: 4.9 mmol/L (ref 3.5–5.1)
Sodium: 141 mmol/L (ref 135–145)

## 2014-04-30 LAB — GLUCOSE, CAPILLARY: Glucose-Capillary: 94 mg/dL (ref 70–99)

## 2014-04-30 MED ORDER — DIAZEPAM 5 MG PO TABS: 5.0000 mg | ORAL_TABLET | Freq: Two times a day (BID) | ORAL | Status: DC

## 2014-04-30 MED ORDER — OXYCODONE-ACETAMINOPHEN 10-325 MG PO TABS: 1.0000 | ORAL_TABLET | Freq: Four times a day (QID) | ORAL | Status: DC | PRN

## 2014-04-30 MED ORDER — GABAPENTIN 300 MG PO CAPS
300.0000 mg | ORAL_CAPSULE | Freq: Two times a day (BID) | ORAL | Status: DC
  Administered 2014-04-30 – 2014-05-01 (×2): 300 mg via ORAL
  Filled 2014-04-30 (×2): qty 1

## 2014-04-30 MED ORDER — TRAZODONE HCL 50 MG PO TABS
50.0000 mg | ORAL_TABLET | Freq: Every evening | ORAL | Status: DC | PRN
  Administered 2014-04-30: 50 mg via ORAL
  Filled 2014-04-30: qty 1

## 2014-04-30 MED ORDER — CITALOPRAM HYDROBROMIDE 20 MG PO TABS: 20.0000 mg | ORAL_TABLET | Freq: Every day | ORAL | Status: DC

## 2014-04-30 NOTE — Evaluation (Addendum)
Occupational Therapy Evaluation Patient Details Name: Valerie Sullivan MRN: [id number] DOB: 09-25-1940 Today's Date: 04/30/2014    History of Present Illness Valerie Sullivan is a 74 y.o. female admitted 04/27/14 with c/o abdominal pain, has  past medical history of chronic abdominal pain secondary to pelvis adhesion, hypertension, hyperlipidemia, GERD, dementia, diabetes mellitus, who presents with worsening abdominal pain   Clinical Impression   Pt demonstrates decline in function and safety with ADLs and ADL mobility with significant decreased strength, balance and endurance. Initially pt states that she had not decided if she will return home or to a SNF for rehab. After her husband enterd the room he stated that he would like her to go to SNF for rehab as he didn't feel [facility] therapy would be enough. OT agrees with short term SNF. No further acute OT services indicated, defer further OT intervention to SNF at this time    Follow Up Recommendations  SNF;Supervision/Assistance - 24 hour    Equipment Recommendations  None recommended by OT;Other (comment) (TBD at next venue of care)    Recommendations for Other Services       Precautions / Restrictions Precautions Precautions: Fall Restrictions Weight Bearing Restrictions: No      Mobility Bed Mobility Overal bed mobility: Needs Assistance Bed Mobility: Supine to Sit     Supine to sit: Min assist     General bed mobility comments: extra time, moves slowly  Transfers Overall transfer level: Needs assistance Equipment used: Rolling walker (2 wheeled) Transfers: Sit to/from Stand Sit to Stand: Min assist         General transfer comment: moves very slowly, extra time to complete task.    Balance Overall balance assessment: Needs assistance Sitting-balance support: Single extremity supported;No upper extremity supported;Feet supported Sitting balance-Leahy Scale: Fair     Standing balance support: Bilateral upper  extremity supported;During functional activity;Single extremity supported Standing balance-Leahy Scale: Poor                              ADL Overall ADL's : Needs assistance/impaired     Grooming: Wash/dry hands;Wash/dry face;Min guard;Sitting   Upper Body Bathing: Minimal assitance;Sitting   Lower Body Bathing: Maximal assistance   Upper Body Dressing : Minimal assistance;Sitting   Lower Body Dressing: Total assistance   Toilet Transfer: Minimal assistance;BSC;RW   Toileting- Clothing Manipulation and Hygiene: Moderate assistance;Sit to/from stand       Functional mobility during ADLs: Minimal assistance General ADL Comments: extra time, moves slowly     Vision  reading glasses   Perception Perception Perception Tested?: No   Praxis      Pertinent Vitals/Pain Pain Assessment: 0-10 Pain Score: 0-No pain     Hand Dominance Right   Extremity/Trunk Assessment Upper Extremity Assessment Upper Extremity Assessment: Overall WFL for tasks assessed;Generalized weakness;RUE deficits/detail;LUE deficits/detail RUE Deficits / Details: shoulder abduction 75 degrees, flexion 80 degrees LUE Deficits / Details: shoulder abduction 75 degrees, flexion 80 degrees   Lower Extremity Assessment Lower Extremity Assessment: Defer to PT evaluation       Communication Communication Communication: No difficulties   Cognition Arousal/Alertness: Lethargic;Suspect due to medications Behavior During Therapy: [facility] for tasks assessed/performed Overall Cognitive Status: Within Functional Limits for tasks assessed                     General Comments   pt very peasant and cooperative  Home Living Family/patient expects to be discharged to:: Private residence Living Arrangements: Spouse/significant other   Type of Home: Apartment Home Access: Stairs to enter   Entrance Stairs-Rails: None Home Layout: One level     Bathroom Shower/Tub:  Teacher, early years/pre: Standard     Home Equipment: Environmental consultant - 2 wheels;Walker - 4 wheels          Prior Functioning/Environment Level of Independence: Needs assistance    ADL's / Homemaking Assistance Needed: husband helped with bathing, dressing        OT Diagnosis: Generalized weakness;Acute pain   OT Problem List: Decreased strength;Decreased knowledge of use of DME or AE;Decreased activity tolerance;Impaired balance (sitting and/or standing);Pain;Decreased range of motion   OT Treatment/Interventions:      OT Goals(Current goals can be found in the care plan section) Acute Rehab OT Goals Patient Stated Goal: to get stronger OT Goal Formulation: With patient/family Time For Goal Achievement: 05/07/14 Potential to Achieve Goals: Good  OT Frequency:     Barriers to D/C:  decreased caregiver support                        End of Session Equipment Utilized During Treatment: Gait belt;Rolling walker  Activity Tolerance: Patient limited by fatigue Patient left: in bed;with call bell/phone within reach;with bed alarm set;with family/visitor present   Time: 1036-1100 OT Time Calculation (min): 24 min Charges:  OT General Charges $OT Visit: 1 Procedure OT Evaluation $Initial OT Evaluation Tier I: 1 Procedure OT Treatments $Therapeutic Activity: 8-22 mins G-Codes:    Britt Bottom 04/30/2014, 12:36 PM
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
## 2014-04-30 NOTE — Discharge Instructions (Signed)

## 2014-04-30 NOTE — Evaluation (Signed)
Occupational Therapy Evaluation Patient Details Name: Valerie Sullivan MRN: [id number] DOB: 01/08/1941 Today's Date: 04/30/2014    History of Present Illness Valerie Sullivan is a 74 y.o. 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 Initially pt stated that she had not decided if she will return home with assist or to SNF for therapy. After her husband entered the room, he stated that he would like for her to go to a SNF for therapy as he didn't;t feel HH would be enough    Follow Up Recommendations  SNF;Supervision/Assistance - 24 hour    Equipment Recommendations  None recommended by OT;Other (comment) (TBD at next venue of care)    Recommendations for Other Services       Precautions / Restrictions Precautions Precautions: Fall Restrictions Weight Bearing Restrictions: No      Mobility Bed Mobility Overal bed mobility: Needs Assistance Bed Mobility: Supine to Sit     Supine to sit: Min assist     General bed mobility comments: extra time, moves slowly  Transfers Overall transfer level: Needs assistance Equipment used: Rolling walker (2 wheeled) Transfers: Sit to/from Stand Sit to Stand: Min assist         General transfer comment: moves very slowly, extra time to complete task.    Balance Overall balance assessment: Needs assistance Sitting-balance support: Single extremity supported;No upper extremity supported;Feet supported Sitting balance-Leahy Scale: Fair     Standing balance support: Bilateral upper extremity supported;During functional activity;Single extremity supported Standing balance-Leahy Scale:  Poor                              ADL Overall ADL's : Needs assistance/impaired     Grooming: Wash/dry hands;Wash/dry face;Min guard;Sitting   Upper Body Bathing: Minimal assitance;Sitting   Lower Body Bathing: Maximal assistance   Upper Body Dressing : Minimal assistance;Sitting   Lower Body Dressing: Total assistance   Toilet Transfer: Minimal assistance;BSC;RW   Toileting- Clothing Manipulation and Hygiene: Moderate assistance;Sit to/from stand       Functional mobility during ADLs: Minimal assistance General ADL Comments: extra time, moves slowly     Vision  reading glasses   Perception Perception Perception Tested?: No   Praxis      Pertinent Vitals/Pain Pain Assessment: 0-10 Pain Score: 0-No pain     Hand Dominance Right   Extremity/Trunk Assessment Upper Extremity Assessment Upper Extremity Assessment: Overall WFL for tasks assessed;Generalized weakness;RUE deficits/detail;LUE deficits/detail RUE Deficits / Details: shoulder abduction 75 degrees, flexion 80 degrees LUE Deficits / Details: shoulder abduction 75 degrees, flexion 80 degrees   Lower Extremity Assessment Lower Extremity Assessment: Defer to PT evaluation       Communication Communication Communication: No difficulties   Cognition Arousal/Alertness: Lethargic;Suspect due to medications Behavior During Therapy: [facility] for tasks assessed/performed Overall Cognitive Status: Within Functional Limits for tasks assessed                     General Comments       Exercises       Shoulder Instructions      Home Living Family/patient expects to be discharged to:: Private residence Living Arrangements: Spouse/significant  other   Type of Home: Apartment Home Access: Stairs to enter   Entrance Stairs-Rails: None Home Layout: One level     Bathroom Shower/Tub: Teacher, early years/pre: Standard     Home Equipment: Environmental consultant - 2 wheels;Walker - 4 wheels           Prior Functioning/Environment Level of Independence: Needs assistance    ADL's / Homemaking Assistance Needed: husband helped with bathing, dressing        OT Diagnosis: Generalized weakness;Acute pain   OT Problem List: Decreased strength;Decreased knowledge of use of DME or AE;Decreased activity tolerance;Impaired balance (sitting and/or standing);Pain;Decreased range of motion   OT Treatment/Interventions:      OT Goals(Current goals can be found in the care plan section) Acute Rehab OT Goals Patient Stated Goal: to get stronger OT Goal Formulation: With patient/family Time For Goal Achievement: 05/07/14 Potential to Achieve Goals: Good  OT Frequency:     Barriers to D/C:            Co-evaluation              End of Session Equipment Utilized During Treatment: Gait belt;Rolling walker  Activity Tolerance: Patient limited by fatigue Patient left: in bed;with call bell/phone within reach;with bed alarm set;with family/visitor present   Time: 1036-1100 OT Time Calculation (min): 24 min Charges:  OT General Charges $OT Visit: 1 Procedure OT Evaluation $Initial OT Evaluation Tier I: 1 Procedure OT Treatments $Therapeutic Activity: 8-22 mins G-Codes:    Britt Bottom 04/30/2014, 12:33 PM

## 2014-04-30 NOTE — Progress Notes (Signed)
Clinical Social Work Department BRIEF PSYCHOSOCIAL ASSESSMENT 04/30/2014  Patient:  Valerie Sullivan, Valerie Sullivan     Account Number:  [id number]     Admit date:  04/27/2014  Clinical Social Worker:  Renold Genta  Date/Time:  04/30/2014 11:54 AM  Referred by:  Physician  Date Referred:  04/30/2014 Referred for  SNF Placement   Other Referral:   Interview type:  Patient Other interview type:   and husband, Fritz Pickerel at bedside    PSYCHOSOCIAL DATA Living Status:  HUSBAND Admitted from facility:   Level of care:   Primary support name:  Jaedyn Marrufo (husband) ph#: [phone] Primary support relationship to patient:  SPOUSE Degree of support available:   good    CURRENT CONCERNS Current Concerns  Post-Acute Placement   Other Concerns:    SOCIAL WORK ASSESSMENT / PLAN CSW received consult from Dr. Maryland Pink for SNF placement.   Assessment/plan status:  Information/Referral to [employer] Other assessment/ plan:   Information/referral to community resources:   CSW completed FL2 and faxed infromation to [facility] - provided SNF list & bed offers to husband.    PATIENT'S/FAMILY'S RESPONSE TO PLAN OF CARE: Patient's husband expressed concerns about patient going to SNF too early, though he chose [facility]. Husband spoke with Tammy @ [facility] to confirm that he would prefer that she go there at discharge. Anticipating discharge tomorrow.          Raynaldo Opitz, [facility] Clinical Social Worker cell #: [phone]

## 2014-04-30 NOTE — Progress Notes (Signed)
Clinical Social Work Department CLINICAL SOCIAL WORK PLACEMENT NOTE 04/30/2014  Patient:  Valerie Sullivan, Valerie Sullivan  Account Number:  [id number] Admit date:  04/27/2014  Clinical Social Worker:  Renold Genta  Date/time:  04/30/2014 11:57 AM  Clinical Social Work is seeking post-discharge placement for this patient at the following level of care:   SKILLED NURSING   (*CSW will update this form in Epic as items are completed)   04/30/2014  Patient/family provided with [place] Department of Clinical Social Work's list of facilities offering this level of care within the geographic area requested by the patient (or if unable, by the patient's family).  04/30/2014  Patient/family informed of their freedom to choose among providers that offer the needed level of care, that participate in Medicare, Medicaid or managed care program needed by the patient, have an available bed and are willing to accept the patient.  04/30/2014  Patient/family informed of [facility]' ownership interest in [facility], as well as of the fact that they are under no obligation to receive care at this facility.  PASARR submitted to EDS on 04/30/2014 PASARR number received on 04/30/2014  FL2 transmitted to all facilities in geographic area requested by pt/family on  04/30/2014 FL2 transmitted to all facilities within larger geographic area on   Patient informed that his/her managed care company has contracts with or will negotiate with  certain facilities, including the following:     Patient/family informed of bed offers received:  04/30/2014 Patient chooses bed at [facility] [place] Physician recommends and patient chooses bed at    Patient to be transferred to [facility] on   Patient to be transferred to facility by  Patient and family notified of transfer on  Name of family member notified:    The following physician request were entered in  Epic:   Additional Comments:    Raynaldo Opitz, [place] Social Worker cell #: [phone]

## 2014-05-01 LAB — GLUCOSE, CAPILLARY: Glucose-Capillary: 99 mg/dL (ref 70–99)

## 2014-05-01 MED ORDER — POLYETHYLENE GLYCOL 3350 17 G PO PACK: 17.0000 g | PACK | Freq: Every day | ORAL | Status: AC | PRN

## 2014-05-01 MED ORDER — GABAPENTIN 300 MG PO CAPS: 300.0000 mg | ORAL_CAPSULE | Freq: Two times a day (BID) | ORAL | Status: AC

## 2014-05-01 MED ORDER — TRAZODONE HCL 50 MG PO TABS: 50.0000 mg | ORAL_TABLET | Freq: Every evening | ORAL | Status: DC | PRN

## 2014-05-01 MED ORDER — DIAZEPAM 5 MG PO TABS: 5.0000 mg | ORAL_TABLET | Freq: Two times a day (BID) | ORAL | Status: AC | PRN
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
## 2014-05-01 NOTE — Progress Notes (Signed)
Report called to nurse at [facility]. Hospital course reviewed and all questions answered. Callie Fielding RN

## 2014-05-01 NOTE — Progress Notes (Signed)
Patient is set to discharge to [facility] SNF today. Patient aware, CSW attempted to reach patient's husband, Fritz Pickerel (ph#: [phone]) though message that his voice mailbox has not been set up yet - therefor message was not left. Husband aware from discussion yesterday that patient will go to [facility] SNF today.  Discharge packet given to RN, Robert Bellow. [facility] called for transport.   Clinical Social Work Department CLINICAL SOCIAL WORK PLACEMENT NOTE 05/01/2014  Patient:  KRISTENE, LIBERATI  Account Number:  [id number] Admit date:  04/27/2014  Clinical Social Worker:  Renold Genta  Date/time:  04/30/2014 11:57 AM  Clinical Social Work is seeking post-discharge placement for this patient at the following level of care:   SKILLED NURSING   (*CSW will update this form in Epic as items are completed)   04/30/2014  Patient/family provided with [facility] Department of Clinical Social Work's list of facilities offering this level of care within the geographic area requested by the patient (or if unable, by the patient's family).  04/30/2014  Patient/family informed of their freedom to choose among providers that offer the needed level of care, that participate in Medicare, Medicaid or managed care program needed by the patient, have an available bed and are willing to accept the patient.  04/30/2014  Patient/family informed of [facility]' ownership interest in [facility], as well as of the fact that they are under no obligation to receive care at this facility.  PASARR submitted to EDS on 04/30/2014 PASARR number received on 04/30/2014  FL2 transmitted to all facilities in geographic area requested by pt/family on  04/30/2014 FL2 transmitted to all facilities within larger geographic area on   Patient informed that his/her managed care company has contracts with or will negotiate with  certain facilities, including the following:      Patient/family informed of bed offers received:  04/30/2014 Patient chooses bed at [facility] [place] Physician recommends and patient chooses bed at    Patient to be transferred to [place] on  05/01/2014 Patient to be transferred to facility by [facility] Patient and family notified of transfer on 05/01/2014 Name of family member notified:    The following physician request were entered in Epic:   Additional Comments:    Raynaldo Opitz, [place] Social Worker cell #: [phone]

## 2014-05-15 ENCOUNTER — Encounter: Payer: Self-pay | Admitting: Internal Medicine

## 2014-05-15 ENCOUNTER — Non-Acute Institutional Stay (SKILLED_NURSING_FACILITY): Payer: Medicare Other | Admitting: Internal Medicine

## 2014-05-15 DIAGNOSIS — F329 Major depressive disorder, single episode, unspecified: Secondary | ICD-10-CM | POA: Diagnosis not present

## 2014-05-15 DIAGNOSIS — F0390 Unspecified dementia without behavioral disturbance: Secondary | ICD-10-CM

## 2014-05-15 DIAGNOSIS — F32A Depression, unspecified: Secondary | ICD-10-CM

## 2014-05-15 DIAGNOSIS — R29898 Other symptoms and signs involving the musculoskeletal system: Secondary | ICD-10-CM | POA: Diagnosis not present

## 2014-05-15 DIAGNOSIS — N2889 Other specified disorders of kidney and ureter: Secondary | ICD-10-CM

## 2014-05-15 DIAGNOSIS — R490 Dysphonia: Secondary | ICD-10-CM

## 2014-05-15 DIAGNOSIS — G894 Chronic pain syndrome: Secondary | ICD-10-CM

## 2014-05-15 DIAGNOSIS — I10 Essential (primary) hypertension: Secondary | ICD-10-CM | POA: Diagnosis not present
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
## 2014-05-18 ENCOUNTER — Other Ambulatory Visit: Payer: Self-pay | Admitting: Urology

## 2014-05-18 DIAGNOSIS — N2889 Other specified disorders of kidney and ureter: Secondary | ICD-10-CM

## 2014-05-23 ENCOUNTER — Ambulatory Visit: Payer: Medicare Other | Admitting: Family Medicine

## 2014-05-30 ENCOUNTER — Ambulatory Visit
Admission: RE | Admit: 2014-05-30 | Discharge: 2014-05-30 | Disposition: A | Discharge status: Home / Self Care | Payer: Medicare Other | Source: Ambulatory Visit | Attending: Urology | Admitting: Urology

## 2014-05-30 ENCOUNTER — Telehealth: Payer: Self-pay

## 2014-05-30 DIAGNOSIS — N2889 Other specified disorders of kidney and ureter: Secondary | ICD-10-CM

## 2014-05-30 HISTORY — PX: IR GENERIC HISTORICAL: IMG1180011

## 2014-05-30 HISTORY — DX: Other specified disorders of kidney and ureter: N28.89
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
## 2014-05-30 NOTE — Consult Note (Signed)
Chief Complaint: Chief Complaint  Patient presents with  . Advice Only    Consult for Cryoablation of Right Renal Mass    Referring Physician(s): Lowella Bandy  History of Present Illness: Valerie Sullivan is a 74 y.o. female referred for evaluation and possible cryoablation of a right renal mass. The most recent imaging of the kidneys is a CT of the abdomen with contrast on 04/27/2014. This demonstrates a stable solid and enhancing mass of the right kidney located in the posterior and medial aspect of the kidney just below the renal pelvis and centered in the cortex with both minimal exophytic and predominately endophytic contours. This mass has been stable by prior imaging including MRI of the abdomen in 2013 and CT studies dating back to October, 2011. On that study, the mass measured approximately 1.5 x 1.6 cm. Based on review of multiple prior CT and 2 separate prior MRI studies, maximal diameter of the lesion is roughly 1.6 cm.   The patient's main chronic complaint has been significant lower abdominal and pelvic pain localizing more in the left lower quadrant. No etiology for pain has been determined by prior imaging and clinical evaluation. She also complains today of a migratory rash with associated pruritus.  Past Medical History  Diagnosis Date  . Constipation   . Pelvic adhesions   . Diabetes mellitus   . Hypertension   . Arthritis     hips/neck  . Dementia     per spouse  . Right renal mass     Past Surgical History  Procedure Laterality Date  . Partial hysterectomy    . Uterine fibroid surgery  1992    tumor removed  . Lumbar laminectomy/decompression microdiscectomy Right 09/23/2012    Procedure: LUMBAR LAMINECTOMY/DECOMPRESSION MICRODISCECTOMY 1 LEVEL;  Surgeon: Elaina Hoops, MD;  Location: [facility];  Service: Neurosurgery;  Laterality: Right;  right lumbar five-sacral one  . Abdominal hysterectomy    . Back surgery    . Esophagogastroduodenoscopy (egd) with  propofol N/A 01/10/2013    Procedure: ESOPHAGOGASTRODUODENOSCOPY (EGD) WITH PROPOFOL;  Surgeon: Garlan Fair, MD;  Location: [facility];  Service: Endoscopy;  Laterality: N/A;  . Colonoscopy with propofol N/A 01/10/2013    Procedure: COLONOSCOPY WITH PROPOFOL;  Surgeon: Garlan Fair, MD;  Location: [facility];  Service: Endoscopy;  Laterality: N/A;    Allergies: Review of patient's allergies indicates no known allergies.  Medications: Prior to Admission medications   Medication Sig Start Date End Date Taking? Authorizing Provider  aspirin 81 MG tablet Take 81 mg by mouth daily.   Yes Historical Provider, MD  citalopram (CELEXA) 20 MG tablet Take 1 tablet (20 mg total) by mouth daily. 04/30/14  Yes Bonnielee Haff, MD  cyclobenzaprine (FLEXERIL) 10 MG tablet Take 10 mg by mouth 3 (three) times daily as needed for muscle spasms.  11/08/13  Yes Historical Provider, MD  diazepam (VALIUM) 5 MG tablet Take 1 tablet (5 mg total) by mouth every 12 (twelve) hours as needed for anxiety or muscle spasms. 05/01/14  Yes Bonnielee Haff, MD  gabapentin (NEURONTIN) 300 MG capsule Take 1 capsule (300 mg total) by mouth 2 (two) times daily. 05/01/14  Yes Bonnielee Haff, MD  meloxicam (MOBIC) 7.5 MG tablet Take 7.5 mg by mouth 2 (two) times daily as needed for pain.   Yes Historical Provider, MD  oxyCODONE-acetaminophen (PERCOCET) 10-325 MG per tablet Take 1 tablet by mouth 4 (four) times daily as needed for pain. 04/30/14  Yes  Bonnielee Haff, MD  pantoprazole (PROTONIX) 40 MG tablet Take 40 mg by mouth daily.   Yes Historical Provider, MD  senna (SENOKOT) 8.6 MG TABS tablet Take 2 tablets by mouth daily as needed for mild constipation.   Yes Historical Provider, MD  traZODone (DESYREL) 50 MG tablet Take 1 tablet (50 mg total) by mouth at bedtime as needed for sleep. 05/01/14  Yes Bonnielee Haff, MD  valsartan (DIOVAN) 160 MG tablet Take 1 tablet (160 mg total) by mouth daily. 04/29/14  Yes Bonnielee Haff, MD   Vitamin D, Ergocalciferol, (DRISDOL) 50000 UNITS CAPS capsule Take 50,000 Units by mouth every 7 (seven) days.   Yes Historical Provider, MD  cetirizine (ZYRTEC) 10 MG tablet Take 10 mg by mouth daily as needed. 11/08/13   Historical Provider, MD  docusate sodium (COLACE) 100 MG capsule Take 1 capsule (100 mg total) by mouth 2 (two) times daily. Patient not taking: Reported on 05/30/2014 04/29/14   Bonnielee Haff, MD  [facility] C-NR 100-10 MG/5ML syrup Take 10 mLs by mouth 2 (two) times daily as needed.  11/08/13   Historical Provider, MD  magnesium oxide (MAG-OX) 400 (241.3 MG) MG tablet Take 1 tablet (400 mg total) by mouth 2 (two) times daily. For 10 days. Patient not taking: Reported on 05/30/2014 04/29/14   Bonnielee Haff, MD  omeprazole (PRILOSEC) 20 MG capsule Take 20 mg by mouth daily.    Historical Provider, MD  polyethylene glycol (MIRALAX / GLYCOLAX) packet Take 17 g by mouth daily as needed for mild constipation. Patient not taking: Reported on 05/30/2014 05/01/14   Bonnielee Haff, MD    Family History  Problem Relation Age of Onset  . Cancer Father     bone  . Heart disease Mother     History   Social History  . Marital Status: Married    Spouse Name: N/A  . Number of Children: N/A  . Years of Education: N/A   Social History Main Topics  . Smoking status: Never Smoker   . Smokeless tobacco: Never Used  . Alcohol Use: No  . Drug Use: No  . Sexual Activity: No   Other Topics Concern  . None   Social History Narrative    ECOG Status: 3 - Symptomatic, >50% confined to bed  Review of Systems: A 12 point ROS discussed and pertinent positives are indicated in the HPI above.  All other systems are negative.  Review of Systems  Constitutional: Negative.   Respiratory: Negative.   Cardiovascular: Negative.   Gastrointestinal: Positive for abdominal pain. Negative for nausea, vomiting, diarrhea, constipation, blood in stool, abdominal distention and anal bleeding.    Genitourinary: Negative.   Skin: Positive for rash.  Neurological: Negative.     Vital Signs: BP 110/60 mmHg  Pulse 70  Temp(Src) 98.4 F (36.9 C) (Oral)  Resp 14  Ht 5\' 3"  (1.6 m)  Wt 172 lb (78.019 kg)  BMI 30.48 kg/m2  SpO2 91%  Physical Exam  Constitutional: She is oriented to person, place, and time. She appears well-developed and well-nourished.  Cardiovascular: Normal rate, regular rhythm and normal heart sounds.  Exam reveals no gallop and no friction rub.   No murmur heard. Pulmonary/Chest: Effort normal and breath sounds normal. No respiratory distress. She has no wheezes. She has no rales.  Abdominal: Soft. Bowel sounds are normal. She exhibits no distension and no mass. There is tenderness. There is no rebound and no guarding.  Neurological: She is alert and oriented to person,  place, and time.  Skin: Rash noted.  Nursing note and vitals reviewed.   Imaging: No results found.  Labs:  CBC:  Recent Labs  03/30/14 1343 04/27/14 1751 04/28/14 0533 04/29/14 0504  WBC 7.0 7.4 7.6 6.4  HGB 11.7* 12.1 11.4* 10.4*  HCT 34.4* 34.3* 33.7* 30.7*  PLT 175 153 153 138*    COAGS: No results for input(s): INR, APTT in the last 8760 hours.  BMP:  Recent Labs  04/28/14 0533 04/28/14 1405 04/29/14 0504 04/30/14 1230  NA 142 138 142 141  K 2.7* 3.3* 4.4 4.9  CL 100 103 110 112  CO2 34* 31 28 26   GLUCOSE 153* 173* 119* 141*  BUN 12 12 10 11   CALCIUM 8.9 8.4 8.6 8.8  CREATININE 0.97 0.95 0.79 0.93  GFRNONAA 57* 58* 81* 60*  GFRAA 66* 67* >90 69*    LIVER FUNCTION TESTS:  Recent Labs  10/25/13 1634 03/30/14 1343 04/27/14 1751 04/28/14 0533  BILITOT 0.4 0.5 0.7 0.8  AST 21 26 21 26   ALT 13 12 11 9   ALKPHOS 117 86 92 80  PROT 7.5 6.8 7.2 6.4  ALBUMIN 3.9 3.7 4.0 3.5    TUMOR MARKERS: No results for input(s): AFPTM, CEA, CA199, CHROMGRNA in the last 8760 hours.  Assessment and Plan:  Valerie Sullivan was accompanied today with her husband. We  reviewed prior imaging studies which show a very stable sized mass of the medial lower right kidney demonstrating contrast enhancement. This mass has not changed significantly since 2011. This is likely a benign tumor or very low-grade malignancy. I did not recommend any treatment at this time or biopsy. I suggested continued surveillance with consideration of percutaneous cryoablation and/or biopsy if the lesion shows definitive growth by imaging.  MRI has not been performed since 2013. I recommended a follow-up MRI in one year.  I did recommend Valerie Sullivan see a dermatologist if she continues to have a bothersome rash. She is scheduled to be evaluated for potential home nursing needs.  Thank you for this interesting consult.  I greatly enjoyed meeting Valerie Sullivan and look forward to participating in their care.  SignedAletta Edouard T 05/30/2014, 3:06 PM     I spent a total of 30 minutes face to face in clinical consultation, greater than 50% of which was counseling/coordinating care for a right renal mass.

## 2014-06-04 ENCOUNTER — Ambulatory Visit: Payer: Medicare Other | Admitting: Internal Medicine

## 2014-06-04 ENCOUNTER — Telehealth: Payer: Self-pay | Admitting: Internal Medicine

## 2014-06-04 ENCOUNTER — Ambulatory Visit: Payer: Medicare Other | Attending: Family Medicine | Admitting: Family Medicine

## 2014-06-04 ENCOUNTER — Encounter: Payer: Self-pay | Admitting: Family Medicine

## 2014-06-04 VITALS — BP 162/88 | HR 99 | Temp 98.0°F | Resp 14

## 2014-06-04 DIAGNOSIS — R296 Repeated falls: Secondary | ICD-10-CM | POA: Diagnosis not present

## 2014-06-04 DIAGNOSIS — F329 Major depressive disorder, single episode, unspecified: Secondary | ICD-10-CM

## 2014-06-04 DIAGNOSIS — N2889 Other specified disorders of kidney and ureter: Secondary | ICD-10-CM

## 2014-06-04 DIAGNOSIS — F0390 Unspecified dementia without behavioral disturbance: Secondary | ICD-10-CM | POA: Diagnosis not present

## 2014-06-04 DIAGNOSIS — G894 Chronic pain syndrome: Secondary | ICD-10-CM

## 2014-06-04 DIAGNOSIS — M545 Low back pain: Secondary | ICD-10-CM | POA: Diagnosis not present

## 2014-06-04 DIAGNOSIS — I10 Essential (primary) hypertension: Secondary | ICD-10-CM | POA: Diagnosis not present

## 2014-06-04 DIAGNOSIS — E119 Type 2 diabetes mellitus without complications: Secondary | ICD-10-CM

## 2014-06-04 DIAGNOSIS — G8929 Other chronic pain: Secondary | ICD-10-CM | POA: Insufficient documentation

## 2014-06-04 DIAGNOSIS — F32A Depression, unspecified: Secondary | ICD-10-CM

## 2014-06-04 LAB — POCT GLYCOSYLATED HEMOGLOBIN (HGB A1C): Hemoglobin A1C: 5.9

## 2014-06-04 LAB — GLUCOSE, POCT (MANUAL RESULT ENTRY): POC Glucose: 108 mg/dl — AB (ref 70–99)

## 2014-06-04 MED ORDER — OXYCODONE-ACETAMINOPHEN 10-325 MG PO TABS: 1.0000 | ORAL_TABLET | Freq: Two times a day (BID) | ORAL | Status: DC | PRN

## 2014-06-04 MED ORDER — VALSARTAN 160 MG PO TABS: 160.0000 mg | ORAL_TABLET | Freq: Every day | ORAL | Status: AC

## 2014-06-04 MED ORDER — DONEPEZIL HCL 10 MG PO TABS: 10.0000 mg | ORAL_TABLET | Freq: Every day | ORAL | Status: AC

## 2014-06-04 MED ORDER — CITALOPRAM HYDROBROMIDE 20 MG PO TABS: 20.0000 mg | ORAL_TABLET | Freq: Every day | ORAL | Status: AC

## 2014-06-04 NOTE — Assessment & Plan Note (Signed)
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

## 2014-06-04 NOTE — Assessment & Plan Note (Signed)
I have discussed with both the patient and her spouse the issue of her frequent falls and that I am concerned some of her medications could be contributory especially since she is elderly.. I am discontinuing Flexeril and have discontinued Trazodone. She will need to be reassessed to determine the need for Diazepam which i will hold off on discontinuing at this time but will not refill.

## 2014-06-04 NOTE — Telephone Encounter (Signed)
Pts family member called requesting medication refill for oxyCODONE-acetaminophen (PERCOCET) 10-325 MG per tablet. Please f/u with pt

## 2014-06-04 NOTE — Assessment & Plan Note (Signed)
Commenced on Aricept

## 2014-06-04 NOTE — Patient Instructions (Signed)

## 2014-06-04 NOTE — Telephone Encounter (Signed)
Patient was called to reschedule her appointment for this afternoon; PCP is out of the office

## 2014-06-04 NOTE — Assessment & Plan Note (Signed)
Uncontrolled due to running out of medications which I have refilled today and her Bp will be reassessed at her next visit.

## 2014-06-04 NOTE — Assessment & Plan Note (Signed)
Diet controlled Advised to make appointment with her Ophthalmologist for an annual eye exam.

## 2014-06-04 NOTE — Assessment & Plan Note (Signed)
Scheduled for cryoablation of renal mass suspicious for renal cell carcinoma which was seen on CT abdomen and pelvis in 03/2014; she had a consult with interventional radiology last week. This could explain abdominal pain; I have also informed them to use laxatives as constipation was also evident on imaging.

## 2014-06-04 NOTE — Progress Notes (Signed)
° °  Subjective:    [name], female    DOB: 1940/05/01, 74 y.o.   MRN: [id number]  HPI This patient is accompanied by her spouse (who gives the history) and was last seen in the clinic in 11/2012. Accompanied by spouse who states she has been in and out of the hospital due to falls with resulting lumbar vertebra fracture and she has [place] been in inpatient rehab. Was seeing Pain management for chronic low back pain and bulging disc until last month when she was discharged "because her insurance company was not paying on time". She has been out of her Percocets and is in severe pain; he has been giving her some of his. Notes reviewed revealed the patient' spouse had refused allowing [facility] care from getting into the house "because he was having procedures of his own" leading to [facility] discharging the patient from their care.  She has frequent falls, last of which was this morning even though she uses a walker.  PMH is notable for:  DM- on diet control;  her Ophthalmologist is Dr Gershon Crane whom she has not seen in a while HTN- has run out of antihypertensives and is needing refills hence elevated BP Last fall was this morning even though she uses a walker Dementia- not on any medications; spouse helps her with her grooming.  Patient has also lost her appetite.  They have had some financial constraints with regards to obtaining medications; the patient' spouse tells me he will borrow some money to get his wife her medications.  Lab Results  Component Value Date   HGBA1C 5.9 06/04/2014        Review of Systems  Constitutional: Positive for appetite change. Negative for chills.  HENT: Positive for congestion, postnasal drip and rhinorrhea.   Eyes: Positive for visual disturbance.  Respiratory: Positive for chest tightness and shortness of breath.   Cardiovascular: Positive for chest pain and palpitations.  Gastrointestinal: Positive for abdominal pain. Negative for  constipation.  Genitourinary: Positive for dysuria and frequency.  Musculoskeletal: Positive for back pain.  Skin: Negative.   Neurological: Positive for light-headedness.  Psychiatric/Behavioral: Positive for hallucinations and behavioral problems.       Objective:   Physical Exam  Constitutional: She is oriented to person, place, and time. She appears well-developed.  Noted to be in pain  Neck: Normal range of motion.  Cardiovascular: Normal rate, regular rhythm and normal heart sounds.   No murmur heard. Pulmonary/Chest: Effort normal. She has wheezes.  Abdominal: Soft. There is tenderness.  Suprapubic tenderness  Musculoskeletal: She exhibits tenderness.  Lumbar spine tenderness to mild palpation, unable to lie supine on exam table  Neurological: She is alert and oriented to person, place, and time.  Skin: Skin is warm and dry.  Psychiatric: She has a normal mood and affect. Her behavior is normal.          Assessment & Plan:  74 year old female last seen in the clinic 18 months ago who presents with acute on chronic low back pain secondary to running out of chronic narcotics. Multiple medical issues addressed at this visit :

## 2014-06-04 NOTE — Progress Notes (Signed)
Pt is here today to address her pain in her abdomen and her back.

## 2014-06-05 NOTE — Progress Notes (Signed)
Quick Note:  Labs addressed at office as well as medications and patient was made aware. ______ 

## 2014-06-07 DIAGNOSIS — N2889 Other specified disorders of kidney and ureter: Secondary | ICD-10-CM | POA: Insufficient documentation

## 2014-06-08 ENCOUNTER — Ambulatory Visit: Payer: Medicare Other | Admitting: Internal Medicine

## 2014-06-12 ENCOUNTER — Ambulatory Visit: Payer: Medicare Other | Admitting: Family Medicine

## 2014-06-14 DIAGNOSIS — R490 Dysphonia: Secondary | ICD-10-CM | POA: Insufficient documentation

## 2014-06-25 ENCOUNTER — Telehealth: Payer: Self-pay | Admitting: *Deleted

## 2014-06-25 NOTE — Telephone Encounter (Signed)
Patient's husband called inquiring about pain clinic referral and the status.  In Epic is says the request is pending.  Routed a message to Maren Reamer asking to clarify and explained to caller that as soon as I heard back from her that I would call him.  Patient wanting me to call him back tonight and I explained it may take longer for me to get a response and I assured him that as soon as I heard anything that I would call him.

## 2014-06-26 ENCOUNTER — Telehealth: Payer: Self-pay | Admitting: *Deleted

## 2014-06-26 NOTE — Telephone Encounter (Signed)
Spoke to Valerie Sullivan and told him that his wife's referral to the pain clinic had happened on 06/06/14.  I gave him the pain clinic's phone number so he could follow up because he was very concerned that they had not heard anything yet.

## 2014-06-29 ENCOUNTER — Telehealth: Payer: Self-pay | Admitting: General Practice

## 2014-06-29 NOTE — Telephone Encounter (Signed)
Patient's husband presents to clinic to request a follow up on pain clinic. Husband states the pain clinic contacted him today stating that patient was denied but refused to explain to him the reason for the denial. Husband states wife (patient) is in a great deal of pain. Please assist asap.

## 2014-06-30 NOTE — Telephone Encounter (Signed)
[place] sent the referral to [facility] and the answer was Thank you for the referral, however patient is not appropriate for our office. They never say why ? I will sent the referral somewhere else as soon as i can  Thank you .

## 2014-07-02 ENCOUNTER — Telehealth: Payer: Self-pay | Admitting: *Deleted

## 2014-07-02 NOTE — Telephone Encounter (Signed)
Patient's husband called asking about status of pain clinic referral.  According to Epic, our referral specialist entered a note saying that original pain clinic ([facility]) had denied the patient and that she was working on other possibilities.  I told patient I would check with her to see where the process was.  Will call patient back as soon as I am given new information.

## 2014-07-03 NOTE — Telephone Encounter (Signed)
I sent the referral by fax 07-01-14  to [facility].They will contact the patient to schedule an appointment  Ph#  [phone] the office is here in [place].

## 2014-07-16 ENCOUNTER — Ambulatory Visit: Payer: Medicare Other | Admitting: Family Medicine

## 2014-08-21 ENCOUNTER — Emergency Department (HOSPITAL_COMMUNITY): Payer: Medicare Other

## 2014-08-21 ENCOUNTER — Encounter (HOSPITAL_COMMUNITY): Payer: Self-pay | Admitting: Emergency Medicine

## 2014-08-21 ENCOUNTER — Emergency Department (HOSPITAL_COMMUNITY)
Admission: EM | Admit: 2014-08-21 | Discharge: 2014-08-21 | Disposition: A | Discharge status: Home / Self Care | Payer: Medicare Other | Attending: Emergency Medicine | Admitting: Emergency Medicine

## 2014-08-21 DIAGNOSIS — Z8742 Personal history of other diseases of the female genital tract: Secondary | ICD-10-CM | POA: Insufficient documentation

## 2014-08-21 DIAGNOSIS — Z79899 Other long term (current) drug therapy: Secondary | ICD-10-CM | POA: Insufficient documentation

## 2014-08-21 DIAGNOSIS — S39012A Strain of muscle, fascia and tendon of lower back, initial encounter: Secondary | ICD-10-CM | POA: Insufficient documentation

## 2014-08-21 DIAGNOSIS — R630 Anorexia: Secondary | ICD-10-CM | POA: Diagnosis not present

## 2014-08-21 DIAGNOSIS — K5900 Constipation, unspecified: Secondary | ICD-10-CM | POA: Insufficient documentation

## 2014-08-21 DIAGNOSIS — R262 Difficulty in walking, not elsewhere classified: Secondary | ICD-10-CM | POA: Diagnosis not present

## 2014-08-21 DIAGNOSIS — E119 Type 2 diabetes mellitus without complications: Secondary | ICD-10-CM | POA: Insufficient documentation

## 2014-08-21 DIAGNOSIS — S3992XA Unspecified injury of lower back, initial encounter: Secondary | ICD-10-CM | POA: Diagnosis present

## 2014-08-21 DIAGNOSIS — Y998 Other external cause status: Secondary | ICD-10-CM | POA: Diagnosis not present

## 2014-08-21 DIAGNOSIS — W1839XA Other fall on same level, initial encounter: Secondary | ICD-10-CM | POA: Insufficient documentation

## 2014-08-21 DIAGNOSIS — Y9389 Activity, other specified: Secondary | ICD-10-CM | POA: Insufficient documentation

## 2014-08-21 DIAGNOSIS — S3991XA Unspecified injury of abdomen, initial encounter: Secondary | ICD-10-CM | POA: Insufficient documentation

## 2014-08-21 DIAGNOSIS — F0390 Unspecified dementia without behavioral disturbance: Secondary | ICD-10-CM | POA: Diagnosis not present

## 2014-08-21 DIAGNOSIS — Z7982 Long term (current) use of aspirin: Secondary | ICD-10-CM | POA: Diagnosis not present

## 2014-08-21 DIAGNOSIS — R110 Nausea: Secondary | ICD-10-CM | POA: Insufficient documentation

## 2014-08-21 DIAGNOSIS — M6281 Muscle weakness (generalized): Secondary | ICD-10-CM | POA: Insufficient documentation

## 2014-08-21 DIAGNOSIS — I10 Essential (primary) hypertension: Secondary | ICD-10-CM | POA: Diagnosis not present

## 2014-08-21 DIAGNOSIS — Y9289 Other specified places as the place of occurrence of the external cause: Secondary | ICD-10-CM | POA: Insufficient documentation

## 2014-08-21 DIAGNOSIS — W19XXXA Unspecified fall, initial encounter: Secondary | ICD-10-CM

## 2014-08-21 DIAGNOSIS — M159 Polyosteoarthritis, unspecified: Secondary | ICD-10-CM | POA: Insufficient documentation

## 2014-08-21 DIAGNOSIS — R531 Weakness: Secondary | ICD-10-CM

## 2014-08-21 DIAGNOSIS — Z7409 Other reduced mobility: Secondary | ICD-10-CM

## 2014-08-21 LAB — URINALYSIS, ROUTINE W REFLEX MICROSCOPIC
Bilirubin Urine: NEGATIVE
GLUCOSE, UA: NEGATIVE mg/dL
Hgb urine dipstick: NEGATIVE
KETONES UR: NEGATIVE mg/dL
Leukocytes, UA: NEGATIVE
NITRITE: NEGATIVE
PROTEIN: NEGATIVE mg/dL
Specific Gravity, Urine: 1.036 — ABNORMAL HIGH (ref 1.005–1.030)
Urobilinogen, UA: 1 mg/dL (ref 0.0–1.0)
pH: 6 (ref 5.0–8.0)

## 2014-08-21 LAB — CBC WITH DIFFERENTIAL/PLATELET
BASOS ABS: 0 10*3/uL (ref 0.0–0.1)
Basophils Relative: 0 % (ref 0–1)
Eosinophils Absolute: 0.1 10*3/uL (ref 0.0–0.7)
Eosinophils Relative: 1 % (ref 0–5)
HEMATOCRIT: 36 % (ref 36.0–46.0)
HEMOGLOBIN: 12.2 g/dL (ref 12.0–15.0)
Lymphocytes Relative: 43 % (ref 12–46)
Lymphs Abs: 3 10*3/uL (ref 0.7–4.0)
MCH: 31.9 pg (ref 26.0–34.0)
MCHC: 33.9 g/dL (ref 30.0–36.0)
MCV: 94 fL (ref 78.0–100.0)
Monocytes Absolute: 0.5 10*3/uL (ref 0.1–1.0)
Monocytes Relative: 8 % (ref 3–12)
Neutro Abs: 3.4 10*3/uL (ref 1.7–7.7)
Neutrophils Relative %: 48 % (ref 43–77)
Platelets: 154 10*3/uL (ref 150–400)
RBC: 3.83 MIL/uL — ABNORMAL LOW (ref 3.87–5.11)
RDW: 12.9 % (ref 11.5–15.5)
WBC: 7.1 10*3/uL (ref 4.0–10.5)

## 2014-08-21 LAB — BASIC METABOLIC PANEL
Anion gap: 9 (ref 5–15)
BUN: 21 mg/dL — AB (ref 6–20)
CO2: 27 mmol/L (ref 22–32)
CREATININE: 0.87 mg/dL (ref 0.44–1.00)
Calcium: 9.4 mg/dL (ref 8.9–10.3)
Chloride: 104 mmol/L (ref 101–111)
GFR calc non Af Amer: >60 mL/min (ref >60)
GLUCOSE: 118 mg/dL — AB (ref 65–99)
Potassium: 3.2 mmol/L — ABNORMAL LOW (ref 3.5–5.1)
Sodium: 140 mmol/L (ref 135–145)

## 2014-08-21 MED ORDER — POTASSIUM CHLORIDE CRYS ER 20 MEQ PO TBCR
40.0000 meq | EXTENDED_RELEASE_TABLET | Freq: Once | ORAL | Status: AC
  Administered 2014-08-21: 40 meq via ORAL
  Filled 2014-08-21: qty 2

## 2014-08-21 MED ORDER — SODIUM CHLORIDE 0.9 % IV BOLUS (SEPSIS)
1000.0000 mL | Freq: Once | INTRAVENOUS | Status: AC
  Administered 2014-08-21: 1000 mL via INTRAVENOUS

## 2014-08-21 MED ORDER — OXYCODONE-ACETAMINOPHEN 5-325 MG PO TABS: 1.0000 | ORAL_TABLET | Freq: Four times a day (QID) | ORAL | Status: DC | PRN

## 2014-08-21 MED ORDER — HYDROCODONE-ACETAMINOPHEN 5-325 MG PO TABS
2.0000 | ORAL_TABLET | Freq: Once | ORAL | Status: AC
  Administered 2014-08-21: 2 via ORAL
  Filled 2014-08-21: qty 2

## 2014-08-21 MED ORDER — ACETAMINOPHEN 325 MG PO TABS
650.0000 mg | ORAL_TABLET | Freq: Once | ORAL | Status: DC
  Filled 2014-08-21: qty 2

## 2014-08-21 MED ORDER — POTASSIUM CHLORIDE CRYS ER 20 MEQ PO TBCR: 40.0000 meq | EXTENDED_RELEASE_TABLET | Freq: Once | ORAL | Status: DC

## 2014-08-21 NOTE — ED Notes (Signed)
Pt back in room after MRI, complaining of lower abdominal pain. Alerting MD

## 2014-08-21 NOTE — Discharge Instructions (Signed)
Return to the ED with any concerns including weakness of legs, not able to urinate, loss of control of bowel or bladder, fever/chills, decreased level of alertness/lethargy, or any other alarming symptoms °

## 2014-08-21 NOTE — Progress Notes (Signed)
EDCM discussed patient with EDP who placed home health orders with face to face.  [facility] faxed home health orders for RN, PT, OT aide and social worker to [facility] with confirmation of receipt.  [facility] consult placed.

## 2014-08-21 NOTE — ED Notes (Signed)
Patient transported to MRI 

## 2014-08-21 NOTE — ED Provider Notes (Signed)
5:19 PM pt signed out to me by Dr. Reather Converse, hospitalist has come to the ED and states that there is no indication for admission to the hospital medically.  Case management is going to see patient and talk with husband.  Continuing to await MR.    7:29 PM case management has seen patient, they are requesting that I order home health- which has been done.  Pt is ready for discharge.   Alfonzo Beers, MD 08/21/14 1929

## 2014-08-21 NOTE — Care Management Note (Addendum)
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  Husband called CM x 1 a "Liar" and very agrumentative Pt has walker in the car Husband drove pt to ED He reports doing all pt's ADLs.  Husband states he goes to [place] on 09/07/14 to have [place] MDs tell him about upcoming left knee surgery He reports wanting to assist with finding services to assist pt prior to his procedure.  Husband state pt has "dementia"  But pt able to tell Cm she has a birthday this month 'It is my birthday month.  On the twenty [age] I will be [age]"  Pt states they live in a small apartment and does not have a land line phone but uses Ipad to make calls.  Pt has a daughter but not supportive per husband.   CMs had to stop speaking with husband and speak with pt only, to complete assessment.  Security contacted x 1 Pt agreed to d/c home Pt stated she was once at snf "I did not need to be there."  "I want to go home" [facility] States they can not afford private duty nursing Husband refused offer for PTAR to assist with d/c home He said "I got it" At end of assessment husband polite, apologized and talking in a non aggressive tone    Action/Plan: CM x 2 assisted pt to bedside commode Pt pivoted to bed with 2 + assist ED RN notified of pt's void CM reviewed in details medicare guidelines, home health [facility]) (length of stay in home, types of [facility] staff available, coverage, primary caregiver, up to 24 hrs before services may be started) and Private duty nursing (PDN-coverage, length of stay in the home types of staff available). CM reviewed availability of [place] SW to assist pcp to get pt to snf (if desired disposition) from the community level. CM provided  pt/family with a list of [place] home health agencies and PDN. Assessed for DME needs, pcp, home health agency Encouraged husband to contact [place] for services  EDP updated, Orders written in EPIC   Expected Discharge Date:  09/04/14  Expected Discharge Plan:    Home with home health services, [facility] service              In-House Referral:   ED SW  Discharge [place] with home health services, [place] service              Post Acute Care Choice:    Home with home health services, [place] service             Choice offered to:   patient and husband   DME Arranged:   bedside commode  DME Agency[facility]   [facility]   HH Arranged:   [place], [place], [place], [place] aide, [place] [facility]  Status of Service:   completed  If discussed at [place] of Stay Meetings, dates discussed:    Additional Comments:  Robbie Lis, RN 08/21/2014, 7:22 PM

## 2014-08-21 NOTE — ED Provider Notes (Signed)
CSN: [id number]     Arrival date & time 08/21/14  1038 History   First MD Initiated Contact with Patient 08/21/14 1122     Chief Complaint  Patient presents with  . Fall     (Consider location/radiation/quality/duration/timing/severity/associated sxs/prior Treatment) HPI Comments: 74 year old female with history of diabetes, lipids, dementia, hysterectomy, falls, high blood pressure, reflux presents with worsening lower back pain and general weakness decreased appetite since the fall proximal one week ago. Patient had mechanical fall her walker gave way on her and she fell on her abdomen causing upper abdominal pain and back pain. Patient had not been seen by provider. Patient's had worsening back pain since and difficulty with ambulation and general weakness. Patient in the ER with her husband. No fevers or chills however urine has had a stronger odor.  Patient is a 74 y.o. female presenting with fall. The history is provided by the patient.  Fall Associated symptoms include abdominal pain. Pertinent negatives include no chest pain, no headaches and no shortness of breath.    Past Medical History  Diagnosis Date  . Constipation   . Pelvic adhesions   . Diabetes mellitus   . Hypertension   . Arthritis     hips/neck  . Dementia     per spouse  . Right renal mass    Past Surgical History  Procedure Laterality Date  . Partial hysterectomy    . Uterine fibroid surgery  1992    tumor removed  . Lumbar laminectomy/decompression microdiscectomy Right 09/23/2012    Procedure: LUMBAR LAMINECTOMY/DECOMPRESSION MICRODISCECTOMY 1 LEVEL;  Surgeon: Elaina Hoops, MD;  Location: [facility];  Service: Neurosurgery;  Laterality: Right;  right lumbar five-sacral one  . Abdominal hysterectomy    . Back surgery    . Esophagogastroduodenoscopy (egd) with propofol N/A 01/10/2013    Procedure: ESOPHAGOGASTRODUODENOSCOPY (EGD) WITH PROPOFOL;  Surgeon: Garlan Fair, MD;  Location: [facility];   Service: Endoscopy;  Laterality: N/A;  . Colonoscopy with propofol N/A 01/10/2013    Procedure: COLONOSCOPY WITH PROPOFOL;  Surgeon: Garlan Fair, MD;  Location: [facility];  Service: Endoscopy;  Laterality: N/A;   Family History  Problem Relation Age of Onset  . Cancer Father     bone  . Heart disease Mother    History  Substance Use Topics  . Smoking status: Never Smoker   . Smokeless tobacco: Never Used  . Alcohol Use: No   OB History    Gravida Para Term Preterm AB TAB SAB Ectopic Multiple Living   1 1        1      Review of Systems  Constitutional: Positive for appetite change and fatigue. Negative for fever and chills.  HENT: Negative for congestion.   Eyes: Negative for visual disturbance.  Respiratory: Negative for shortness of breath.   Cardiovascular: Negative for chest pain.  Gastrointestinal: Positive for nausea and abdominal pain. Negative for vomiting.  Genitourinary: Negative for dysuria and flank pain.  Musculoskeletal: Positive for back pain. Negative for neck pain and neck stiffness.  Skin: Negative for rash.  Neurological: Negative for light-headedness and headaches.      Allergies  Review of patient's allergies indicates no known allergies.  Home Medications   Prior to Admission medications   Medication Sig Start Date End Date Taking? Authorizing Provider  aspirin 81 MG tablet Take 81 mg by mouth daily.   Yes Historical Provider, MD  cetirizine (ZYRTEC) 10 MG tablet Take 10 mg by mouth daily  as needed for allergies.  11/08/13  Yes Historical Provider, MD  citalopram (CELEXA) 20 MG tablet Take 1 tablet (20 mg total) by mouth daily. 06/04/14  Yes Arnoldo Morale, MD  diazepam (VALIUM) 5 MG tablet Take 1 tablet (5 mg total) by mouth every 12 (twelve) hours as needed for anxiety or muscle spasms. 05/01/14  Yes Bonnielee Haff, MD  docusate sodium (COLACE) 100 MG capsule Take 1 capsule (100 mg total) by mouth 2 (two) times daily. Patient taking  differently: Take 100 mg by mouth 2 (two) times daily as needed for mild constipation or moderate constipation.  04/29/14  Yes Bonnielee Haff, MD  donepezil (ARICEPT) 10 MG tablet Take 1 tablet (10 mg total) by mouth at bedtime. 06/04/14  Yes Arnoldo Morale, MD  gabapentin (NEURONTIN) 300 MG capsule Take 1 capsule (300 mg total) by mouth 2 (two) times daily. 05/01/14  Yes Bonnielee Haff, MD  meloxicam (MOBIC) 7.5 MG tablet Take 7.5 mg by mouth 2 (two) times daily as needed for pain.   Yes Historical Provider, MD  oxyCODONE (OXY IR/ROXICODONE) 5 MG immediate release tablet Take 5 mg by mouth daily as needed for breakthrough pain. Severe pain 08/01/14  Yes Historical Provider, MD  oxyCODONE-acetaminophen (PERCOCET) 10-325 MG per tablet Take 1 tablet by mouth every 12 (twelve) hours as needed for pain. 06/04/14  Yes Arnoldo Morale, MD  pantoprazole (PROTONIX) 40 MG tablet Take 40 mg by mouth daily.   Yes Historical Provider, MD  valsartan (DIOVAN) 160 MG tablet Take 1 tablet (160 mg total) by mouth daily. 06/04/14  Yes Arnoldo Morale, MD  Vitamin D, Ergocalciferol, (DRISDOL) 50000 UNITS CAPS capsule Take 50,000 Units by mouth every 7 (seven) days.   Yes Historical Provider, MD  magnesium oxide (MAG-OX) 400 (241.3 MG) MG tablet Take 1 tablet (400 mg total) by mouth 2 (two) times daily. For 10 days. Patient not taking: Reported on 05/30/2014 04/29/14   Bonnielee Haff, MD  polyethylene glycol [facility] Floria Raveling) packet Take 17 g by mouth daily as needed for mild constipation. Patient not taking: Reported on 05/30/2014 05/01/14   Bonnielee Haff, MD   BP 150/88 mmHg  Pulse 71  Temp(Src) 98 F (36.7 C) (Oral)  Resp 17  SpO2 96% Physical Exam  Constitutional: She is oriented to person, place, and time. She appears well-developed and well-nourished.  HENT:  Head: Normocephalic and atraumatic.  Dry mucous membranes  Eyes: Conjunctivae are normal. Right eye exhibits no discharge. Left eye exhibits no discharge.  Neck:  Normal range of motion. Neck supple. No tracheal deviation present.  Cardiovascular: Normal rate and regular rhythm.   Pulmonary/Chest: Effort normal and breath sounds normal.  Abdominal: Soft. She exhibits no distension. There is no tenderness. There is no guarding.  Musculoskeletal: She exhibits tenderness. She exhibits no edema.  Patient has paraspinal and midline lower lumbar tenderness and sacral tenderness. Patient has no significant hip tenderness with flexion. Neck supple no midline cervical tenderness full range of motion.  Neurological: She is alert and oriented to person, place, and time.  Reflex Scores:      Patellar reflexes are 1+ on the right side and 1+ on the left side.      Achilles reflexes are 1+ on the right side and 1+ on the left side. Patient moves all extremities equal with general weakness. No arm or leg drift. Patient 5+ strength equal bilateral lower extremity's with hip and knee flexion, great toe flexion extension, sensation intact equal to palpation lower extremities.  Skin: Skin is warm. No rash noted.  Psychiatric: She has a normal mood and affect.  Nursing note and vitals reviewed.   ED Course  Procedures (including critical care time) Labs Review Labs Reviewed  BASIC METABOLIC PANEL - Abnormal; Notable for the following:    Potassium 3.2 (*)    Glucose, Bld 118 (*)    BUN 21 (*)    All other components within normal limits  CBC WITH DIFFERENTIAL/PLATELET - Abnormal; Notable for the following:    RBC 3.83 (*)    All other components within normal limits  URINALYSIS, ROUTINE W REFLEX MICROSCOPIC (NOT AT [facility]) - Abnormal; Notable for the following:    Color, Urine AMBER (*)    APPearance CLOUDY (*)    Specific Gravity, Urine 1.036 (*)    All other components within normal limits  URINE CULTURE    Imaging Review Dg Pelvis 1-2 Views  08/21/2014   CLINICAL DATA:  74 year old female who fell 5 days ago. Pain and unable to walk. Initial encounter.   EXAM: PELVIS - 1-2 VIEW  COMPARISON:  CT Abdomen and Pelvis 04/27/2014.  FINDINGS: New metallic focus projecting over the right lower quadrant might represent external zipper artifact, uncertain. Negative visible bowel gas pattern.  Bone mineralization is within normal limits. Pelvis appears stable and intact. SI joints within normal limits. Femoral heads remain normally located. Grossly intact proximal femurs. Lumbosacral junction chronic disc and endplate degeneration re- identified.  IMPRESSION: No acute fracture or dislocation identified about the pelvis.   Electronically Signed   By: Genevie Ann M.D.   On: 08/21/2014 13:14   Dg Elbow Complete Left  08/21/2014   CLINICAL DATA:  74 year old female status post fall 5 days ago with pain. Initial encounter.  EXAM: LEFT ELBOW - COMPLETE 3+ VIEW  COMPARISON:  None.  FINDINGS: Bone mineralization is within normal limits. Mild degenerative spurring at the coronoid. No joint effusion identified. Radial head appears intact. Alignment within normal limits. No acute fracture or dislocation.  IMPRESSION: No acute fracture or dislocation identified about the left elbow.   Electronically Signed   By: Genevie Ann M.D.   On: 08/21/2014 13:16   Dg Elbow Complete Right  08/21/2014   CLINICAL DATA:  Fall 5 days ago.  Elbow pain.  EXAM: RIGHT ELBOW - COMPLETE 3+ VIEW  COMPARISON:  Contralateral elbow today.  FINDINGS: There is no evidence of fracture, dislocation, or joint effusion. There is no evidence of arthropathy or other focal bone abnormality. Soft tissues are unremarkable.  IMPRESSION: Negative.   Electronically Signed   By: Dereck Ligas M.D.   On: 08/21/2014 13:27   Ct Lumbar Spine Wo Contrast  08/21/2014   CLINICAL DATA:  74 year old female who fell last week. Unable to walk. Lumbar pain. Initial encounter.  EXAM: CT LUMBAR SPINE WITHOUT CONTRAST  TECHNIQUE: Multidetector CT imaging of the lumbar spine was performed without intravenous contrast administration. Multiplanar  CT image reconstructions were also generated.  COMPARISON:  CT Abdomen and Pelvis 04/27/2014. Pelvis radiograph from today reported separately.  FINDINGS: Normal lumbar segmentation. Stable lumbar vertebral height and alignment, including retrolisthesis at L5-S1 and trace anterolisthesis at L2-L3 and L3-L4. No vertebral body fracture identified. No pars fracture. No lumbar fracture identified. Visible sacrum appears intact.  Stable, negative visualized noncontrast abdominal viscera. Left gonadal vein and/or add neck is a phleboliths. Negative visualized posterior paraspinal soft tissues.  Superimposed chronic degenerative changes in the lumbar spine.  T12-L1: Moderate to severe facet hypertrophy with  vacuum facet phenomena. Mild disc bulge.  L1-L2:  Mild disc bulge and facet hypertrophy.  L2-L3: Vacuum disc. Disc space loss. Circumferential disc bulge. Mild to moderate facet hypertrophy. Up to mild spinal stenosis and foraminal stenosis.  L3-L4: Moderate right eccentric circumferential disc bulge. Moderate to severe facet degeneration greater on the right. Mild spinal and mild to moderate foraminal stenosis.  L4-L5: Moderate circumferential disc bulge. Trace vacuum disc. Mild to moderate facet hypertrophy. No definite stenosis.  L5-S1: Extensive vacuum disc. Disc bulge with broad-based posterior component. Moderate facet hypertrophy. Up to mild spinal and moderate foraminal stenosis.  IMPRESSION: 1.  No acute fracture or listhesis identified in the lumbar spine. 2. Widespread lumbar spine degeneration. Mild multifactorial spinal stenosis.   Electronically Signed   By: Genevie Ann M.D.   On: 08/21/2014 13:42     EKG Interpretation None      MDM   Final diagnoses:  Fall  Lumbar strain, initial encounter  Decreased ambulation status  General weakness  General weakness   Patient presents with general weakness and worsening lower back pain, I baseline patient can get around with a walker however with her  pain weakness patients unable to ambulate per husband. Patient also has mild dementia history. Husband helps with the history. Plan for screening blood work, IV fluids, CT scan of the lumbar spine and x-rays.  X-rays and CT scan results reviewed significant arthritis but no fracture. Pain medicines given in the ER. IV fluids given. Basic blood work no acute findings urinalysis unremarkable. Vitals unremarkable mild elevated blood pressure. Patient unable to walk combination of general weakness and pain. Patient will require admission for further evaluation, no focal neuro deficits on exam, no emergent MRI ordered however discussed with patient she may need this inpatient.  The patients results and plan were reviewed and discussed.   Any x-rays performed were personally reviewed by myself.   Differential diagnosis were considered with the presenting HPI.  Medications  acetaminophen (TYLENOL) tablet 650 mg (650 mg Oral Not Given 08/21/14 1235)  sodium chloride 0.9 % bolus 1,000 mL (0 mLs Intravenous Stopped 08/21/14 1507)  potassium chloride SA (K-DUR,KLOR-CON) CR tablet 40 mEq (40 mEq Oral Given 08/21/14 1508)  HYDROcodone-acetaminophen (NORCO/VICODIN) 5-325 MG per tablet 2 tablet (2 tablets Oral Given 08/21/14 1508)    Filed Vitals:   08/21/14 1102 08/21/14 1316 08/21/14 1430  BP: 155/87 160/94 150/88  Pulse: 87 76 71  Temp: 97.9 F (36.6 C) 98 F (36.7 C)   TempSrc: Oral Oral   Resp: 18 16 17   SpO2: 94% 95% 96%    Final diagnoses:  Fall  Lumbar strain, initial encounter  Decreased ambulation status  General weakness  General weakness   Discuss case with the hospitalist. Hospice will evaluate the patient ER, recommended social work and case management consult and possibly MRI help determine admission needs versus nursing home.  Final dispo pending, signed out to ED provider.   Elnora Morrison, MD 08/21/14 [phone]

## 2014-08-21 NOTE — ED Notes (Signed)
Patient is unable to walk, she seems to weak, barely can keep her balance. I tried assisting her with a walker

## 2014-08-21 NOTE — ED Notes (Signed)
Pt had fall last week, pt now unable to walk or stand up straight. PCP told pt to come to ED. Pt c/o soreness to right side and when breathing.

## 2014-08-21 NOTE — Progress Notes (Addendum)
1752 Pt returned from MRI.  Placed on bedpan by ED nursing staff.  Husband not present in ED. 1728 ED CM spoke with EDP, Linker, with  S. [facility] about disposition plan Pt in MRI. No family in pt room #7

## 2014-08-23 LAB — URINE CULTURE

## 2014-08-28 ENCOUNTER — Telehealth: Payer: Self-pay | Admitting: Internal Medicine

## 2014-08-28 NOTE — Telephone Encounter (Signed)
[facility] called stating husband declined care and only accepted physical therapy

## 2014-09-18 ENCOUNTER — Telehealth: Payer: Self-pay | Admitting: Internal Medicine

## 2014-09-18 NOTE — Telephone Encounter (Signed)
Daphene Jaeger from [facility], would like to know if the provider could sign the Home Health plan of care for the month of June.... [phone] ext. [phone] please call Amy..patient saw Dr. Jarold Song

## 2014-12-14 ENCOUNTER — Emergency Department (HOSPITAL_COMMUNITY)
Admission: EM | Admit: 2014-12-14 | Discharge: 2014-12-15 | Disposition: A | Discharge status: Home / Self Care | Payer: Medicare Other | Attending: Emergency Medicine | Admitting: Emergency Medicine

## 2014-12-14 ENCOUNTER — Emergency Department (HOSPITAL_COMMUNITY): Payer: Medicare Other

## 2014-12-14 ENCOUNTER — Encounter (HOSPITAL_COMMUNITY): Payer: Self-pay

## 2014-12-14 DIAGNOSIS — Z8719 Personal history of other diseases of the digestive system: Secondary | ICD-10-CM | POA: Diagnosis not present

## 2014-12-14 DIAGNOSIS — E119 Type 2 diabetes mellitus without complications: Secondary | ICD-10-CM | POA: Diagnosis not present

## 2014-12-14 DIAGNOSIS — F1110 Opioid abuse, uncomplicated: Secondary | ICD-10-CM | POA: Insufficient documentation

## 2014-12-14 DIAGNOSIS — Z87448 Personal history of other diseases of urinary system: Secondary | ICD-10-CM | POA: Insufficient documentation

## 2014-12-14 DIAGNOSIS — Z79899 Other long term (current) drug therapy: Secondary | ICD-10-CM | POA: Insufficient documentation

## 2014-12-14 DIAGNOSIS — I10 Essential (primary) hypertension: Secondary | ICD-10-CM | POA: Insufficient documentation

## 2014-12-14 DIAGNOSIS — M158 Other polyosteoarthritis: Secondary | ICD-10-CM | POA: Diagnosis not present

## 2014-12-14 DIAGNOSIS — F0390 Unspecified dementia without behavioral disturbance: Secondary | ICD-10-CM | POA: Diagnosis not present

## 2014-12-14 DIAGNOSIS — F1310 Sedative, hypnotic or anxiolytic abuse, uncomplicated: Secondary | ICD-10-CM | POA: Diagnosis not present

## 2014-12-14 DIAGNOSIS — R1032 Left lower quadrant pain: Secondary | ICD-10-CM | POA: Diagnosis not present

## 2014-12-14 DIAGNOSIS — Z90710 Acquired absence of both cervix and uterus: Secondary | ICD-10-CM | POA: Diagnosis not present

## 2014-12-14 DIAGNOSIS — Z008 Encounter for other general examination: Secondary | ICD-10-CM | POA: Diagnosis present

## 2014-12-14 DIAGNOSIS — F4322 Adjustment disorder with anxiety: Secondary | ICD-10-CM | POA: Diagnosis not present

## 2014-12-14 DIAGNOSIS — R109 Unspecified abdominal pain: Secondary | ICD-10-CM

## 2014-12-14 DIAGNOSIS — Z7982 Long term (current) use of aspirin: Secondary | ICD-10-CM | POA: Diagnosis not present

## 2014-12-14 DIAGNOSIS — Z8742 Personal history of other diseases of the female genital tract: Secondary | ICD-10-CM | POA: Insufficient documentation

## 2014-12-14 LAB — CBC WITH DIFFERENTIAL/PLATELET
Basophils Absolute: 0 10*3/uL (ref 0.0–0.1)
Basophils Relative: 0 %
EOS ABS: 0.1 10*3/uL (ref 0.0–0.7)
EOS PCT: 1 %
HCT: 35 % — ABNORMAL LOW (ref 36.0–46.0)
Hemoglobin: 12.5 g/dL (ref 12.0–15.0)
LYMPHS ABS: 3 10*3/uL (ref 0.7–4.0)
Lymphocytes Relative: 24 %
MCH: 33.9 pg (ref 26.0–34.0)
MCHC: 35.7 g/dL (ref 30.0–36.0)
MCV: 94.9 fL (ref 78.0–100.0)
Monocytes Absolute: 1 10*3/uL (ref 0.1–1.0)
Monocytes Relative: 8 %
Neutro Abs: 8.6 10*3/uL — ABNORMAL HIGH (ref 1.7–7.7)
Neutrophils Relative %: 67 %
Platelets: 175 10*3/uL (ref 150–400)
RBC: 3.69 MIL/uL — AB (ref 3.87–5.11)
RDW: 12.3 % (ref 11.5–15.5)
WBC: 12.7 10*3/uL — AB (ref 4.0–10.5)

## 2014-12-14 LAB — RAPID URINE DRUG SCREEN, HOSP PERFORMED
AMPHETAMINES: NOT DETECTED
BENZODIAZEPINES: POSITIVE — AB
Barbiturates: NOT DETECTED
Cocaine: NOT DETECTED
OPIATES: POSITIVE — AB
Tetrahydrocannabinol: NOT DETECTED

## 2014-12-14 LAB — COMPREHENSIVE METABOLIC PANEL
ALT: 13 U/L — AB (ref 14–54)
AST: 21 U/L (ref 15–41)
Albumin: 4.3 g/dL (ref 3.5–5.0)
Alkaline Phosphatase: 92 U/L (ref 38–126)
Anion gap: 7 (ref 5–15)
BUN: 19 mg/dL (ref 6–20)
CHLORIDE: 104 mmol/L (ref 101–111)
CO2: 30 mmol/L (ref 22–32)
Calcium: 9.6 mg/dL (ref 8.9–10.3)
Creatinine, Ser: 0.94 mg/dL (ref 0.44–1.00)
GFR calc non Af Amer: 58 mL/min — ABNORMAL LOW (ref >60)
Glucose, Bld: 131 mg/dL — ABNORMAL HIGH (ref 65–99)
Potassium: 3 mmol/L — ABNORMAL LOW (ref 3.5–5.1)
SODIUM: 141 mmol/L (ref 135–145)
Total Bilirubin: 0.6 mg/dL (ref 0.3–1.2)
Total Protein: 7.6 g/dL (ref 6.5–8.1)

## 2014-12-14 LAB — URINALYSIS, ROUTINE W REFLEX MICROSCOPIC
GLUCOSE, UA: NEGATIVE mg/dL
Hgb urine dipstick: NEGATIVE
KETONES UR: NEGATIVE mg/dL
LEUKOCYTES UA: NEGATIVE
Nitrite: NEGATIVE
PROTEIN: NEGATIVE mg/dL
Specific Gravity, Urine: 1.026 (ref 1.005–1.030)
UROBILINOGEN UA: 2 mg/dL — AB (ref 0.0–1.0)
pH: 6.5 (ref 5.0–8.0)

## 2014-12-14 LAB — LIPASE, BLOOD: Lipase: 17 U/L — ABNORMAL LOW (ref 22–51)

## 2014-12-14 MED ORDER — MORPHINE SULFATE (PF) 4 MG/ML IV SOLN
4.0000 mg | Freq: Once | INTRAVENOUS | Status: AC
  Administered 2014-12-14: 4 mg via INTRAVENOUS
  Filled 2014-12-14: qty 1

## 2014-12-14 MED ORDER — IOHEXOL 300 MG/ML  SOLN
100.0000 mL | Freq: Once | INTRAMUSCULAR | Status: AC | PRN
  Administered 2014-12-14: 100 mL via INTRAVENOUS

## 2014-12-14 MED ORDER — LORAZEPAM 2 MG/ML IJ SOLN
1.0000 mg | Freq: Once | INTRAMUSCULAR | Status: AC
  Administered 2014-12-14: 1 mg via INTRAVENOUS
  Filled 2014-12-14: qty 1

## 2014-12-14 MED ORDER — SODIUM CHLORIDE 0.9 % IV BOLUS (SEPSIS)
1000.0000 mL | Freq: Once | INTRAVENOUS | Status: AC
  Administered 2014-12-14: 1000 mL via INTRAVENOUS

## 2014-12-14 MED ORDER — POTASSIUM CHLORIDE CRYS ER 20 MEQ PO TBCR
40.0000 meq | EXTENDED_RELEASE_TABLET | Freq: Once | ORAL | Status: AC
  Administered 2014-12-14: 40 meq via ORAL
  Filled 2014-12-14: qty 2

## 2014-12-14 NOTE — ED Notes (Signed)
Patient assisted to the restroom with tech using walker.
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
## 2014-12-14 NOTE — ED Notes (Signed)
Unsuccessful IV start/blood draw x 2.  Will ask another RN to attempt.

## 2014-12-14 NOTE — Discharge Instructions (Signed)
Take your meds as prescribed.   Follow up with your doctor.   Make sure you are safe at home.   Return to ER if you have severe abdominal pain, vomiting, fever, neck pain, thoughts of harming yourself or others, hallucinations.
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
## 2014-12-15 DIAGNOSIS — I10 Essential (primary) hypertension: Secondary | ICD-10-CM | POA: Diagnosis not present

## 2014-12-15 DIAGNOSIS — F0390 Unspecified dementia without behavioral disturbance: Secondary | ICD-10-CM | POA: Diagnosis not present

## 2014-12-15 DIAGNOSIS — Z008 Encounter for other general examination: Secondary | ICD-10-CM | POA: Diagnosis present

## 2014-12-15 DIAGNOSIS — Z87448 Personal history of other diseases of urinary system: Secondary | ICD-10-CM | POA: Diagnosis not present

## 2014-12-15 DIAGNOSIS — F1310 Sedative, hypnotic or anxiolytic abuse, uncomplicated: Secondary | ICD-10-CM | POA: Diagnosis not present

## 2014-12-15 DIAGNOSIS — M158 Other polyosteoarthritis: Secondary | ICD-10-CM | POA: Diagnosis not present

## 2014-12-15 DIAGNOSIS — F1110 Opioid abuse, uncomplicated: Secondary | ICD-10-CM | POA: Diagnosis not present

## 2014-12-15 DIAGNOSIS — F4322 Adjustment disorder with anxiety: Secondary | ICD-10-CM | POA: Diagnosis not present

## 2014-12-15 DIAGNOSIS — Z8742 Personal history of other diseases of the female genital tract: Secondary | ICD-10-CM | POA: Diagnosis not present

## 2014-12-15 DIAGNOSIS — Z79899 Other long term (current) drug therapy: Secondary | ICD-10-CM | POA: Diagnosis not present

## 2014-12-15 DIAGNOSIS — R1032 Left lower quadrant pain: Secondary | ICD-10-CM | POA: Diagnosis not present

## 2014-12-15 DIAGNOSIS — Z8719 Personal history of other diseases of the digestive system: Secondary | ICD-10-CM | POA: Diagnosis not present

## 2014-12-15 DIAGNOSIS — Z7982 Long term (current) use of aspirin: Secondary | ICD-10-CM | POA: Diagnosis not present

## 2014-12-15 DIAGNOSIS — Z90710 Acquired absence of both cervix and uterus: Secondary | ICD-10-CM | POA: Diagnosis not present

## 2014-12-15 DIAGNOSIS — E119 Type 2 diabetes mellitus without complications: Secondary | ICD-10-CM | POA: Diagnosis not present

## 2014-12-15 MED ORDER — IRBESARTAN 150 MG PO TABS
150.0000 mg | ORAL_TABLET | Freq: Every day | ORAL | Status: DC
  Administered 2014-12-15: 150 mg via ORAL
  Filled 2014-12-15: qty 1

## 2014-12-15 MED ORDER — DONEPEZIL HCL 10 MG PO TABS
10.0000 mg | ORAL_TABLET | Freq: Every day | ORAL | Status: DC
  Filled 2014-12-15: qty 1

## 2014-12-15 MED ORDER — LORATADINE 10 MG PO TABS
10.0000 mg | ORAL_TABLET | Freq: Every day | ORAL | Status: DC
  Administered 2014-12-15: 10 mg via ORAL
  Filled 2014-12-15: qty 1

## 2014-12-15 MED ORDER — CITALOPRAM HYDROBROMIDE 20 MG PO TABS
20.0000 mg | ORAL_TABLET | Freq: Every day | ORAL | Status: DC
  Administered 2014-12-15: 20 mg via ORAL
  Filled 2014-12-15: qty 1

## 2014-12-15 MED ORDER — ACETAMINOPHEN 325 MG PO TABS
650.0000 mg | ORAL_TABLET | Freq: Once | ORAL | Status: AC
  Administered 2014-12-15: 650 mg via ORAL
  Filled 2014-12-15: qty 2

## 2014-12-15 MED ORDER — GABAPENTIN 300 MG PO CAPS
300.0000 mg | ORAL_CAPSULE | Freq: Two times a day (BID) | ORAL | Status: DC
  Administered 2014-12-15: 300 mg via ORAL
  Filled 2014-12-15: qty 1

## 2014-12-15 MED ORDER — OXYCODONE HCL 5 MG PO TABS
5.0000 mg | ORAL_TABLET | Freq: Every day | ORAL | Status: DC | PRN
  Administered 2014-12-15: 5 mg via ORAL
  Filled 2014-12-15: qty 1

## 2014-12-15 MED ORDER — MAGNESIUM OXIDE 400 (241.3 MG) MG PO TABS: 400.0000 mg | ORAL_TABLET | Freq: Two times a day (BID) | ORAL | Status: DC

## 2014-12-15 MED ORDER — MELOXICAM 7.5 MG PO TABS: 7.5000 mg | ORAL_TABLET | Freq: Two times a day (BID) | ORAL | Status: DC | PRN

## 2014-12-15 MED ORDER — DOCUSATE SODIUM 100 MG PO CAPS
100.0000 mg | ORAL_CAPSULE | Freq: Two times a day (BID) | ORAL | Status: DC
  Administered 2014-12-15: 100 mg via ORAL
  Filled 2014-12-15: qty 1

## 2014-12-15 MED ORDER — VITAMIN D (ERGOCALCIFEROL) 1.25 MG (50000 UNIT) PO CAPS: 50000.0000 [IU] | ORAL_CAPSULE | ORAL | Status: DC

## 2014-12-15 MED ORDER — DIAZEPAM 5 MG PO TABS
5.0000 mg | ORAL_TABLET | Freq: Two times a day (BID) | ORAL | Status: DC | PRN
  Administered 2014-12-15: 5 mg via ORAL
  Filled 2014-12-15: qty 1

## 2014-12-15 NOTE — ED Notes (Signed)
Bed: KH99 Expected date:  Expected time:  Means of arrival:  Comments: Valerie Sullivan-patient returning

## 2014-12-15 NOTE — ED Notes (Signed)
[facility] called---- received info that they were not able to get in to pt's house; pt being brought back to ED at this time.
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
## 2014-12-15 NOTE — Progress Notes (Signed)
Valerie Sullivan continues to worry about the whereabouts of her husband and his safety. This is the focus of her thoughts and of her concerns. She diverted all questions about her personal health and comfort to more narrative concerns for her husband and an out of town Counselling psychologist. She is concerned that money to pay their bills has been lost. I may have detected signs of early stage dementia, in the stories that she tells and her inability to remember what she has related minutes before. This may also be evidence of extreme stress response to her current situation. She states she is not afraid of anything or anybody, but in conversation she reveals her fears for her safety, the safety of her husband, and the safety of those living in her community. She relates the tragic events seen on televisions in which people of color are killed causes her stressful concern for those she loves and knows.  Valerie Sullivan is a person who enjoys being listened to. She seems to have little interaction with others who enjoy conversations and story telling as much as she does.  She indicates she is appreciative of social workers and nurses who listen and care for her.  Valerie Sullivan. Valerie Sullivan, [place]

## 2014-12-15 NOTE — ED Notes (Signed)
Bed: WA27 Expected date:  Expected time:  Means of arrival:  Comments: Hold for room 25

## 2014-12-15 NOTE — ED Provider Notes (Signed)
Pt is awaiting social work evaluation.  Evaluated last night in the ED.  Pt has known gastric cancer.  CT scan without acute process.  Pt is having pain this am.  Will order her home meds which includes oxycodone.  Dorie Rank, MD 12/15/14 0800
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
## 2014-12-15 NOTE — ED Notes (Signed)
[facility] here to transport pt to home. 

## 2014-12-15 NOTE — ED Notes (Addendum)
A call was taken by Nursing from a caller who identified himself as the husband of the patient.  Caller asked to speak to the patient.  Caller was advised that there was no phone in the room but that if he gave [place] his number we could give the patient a mobile phone and have it call the caller.  Caller became extremely irrate and insisted on being explained why there was no phone in the room.  Caller was explained that Nursing was unaware of why a phone was not placed in the room, at which point the caller began to call Nurse names.  Caller was told that if he gave the Nurse his number that he would call him back with his wife on the phone.  Caller stated Nurse was incompetent, unprofessional. And stupid,"  Caller was again asked for his number but refused to give it stating that "you are a stupid ass and fuck you!"  Caller then hung up.  Caller called again five minutes later and began berating Nurse and yelling into the phone.  Caller was advised that attempts to return patient home last night were unsuccessful.  Caller asked how she was returned and it was stated that the chart indicated she was returned by EMS.  Caller again became angry and belligerent and berated Nurse for attempting to do such.  Caller was advised that this was the only means that the hospital had to return the patient to her domicile.  Caller again became loud and aggressive on the phone.  Caller was advised that due to his yelling he was not comprehensible on the telephone.  Pt continued to yell and rant.  Nurse hung up the phone.  Five minutes later caller again called [facility] and stated that he would come and get the patient and to have her dressed and ready to go.  Nursing advised Charge and EDP [place] of situation who stated that if person presenting could verify identity then we would release patient into their care.  A note of social work note was made and an attempt was made to contact social work but they are not present on the weekend.
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
## 2014-12-15 NOTE — ED Notes (Signed)
Patient did not have any phone number to call anyone. Helped patient call all the numbers she had in her purse. Patient has trouble walking. Patients states she has a hard time getting around. Patient needs assistant at home.

## 2014-12-16 ENCOUNTER — Emergency Department (HOSPITAL_COMMUNITY)
Admission: EM | Admit: 2014-12-16 | Discharge: 2014-12-19 | Payer: Medicare Other | Attending: Emergency Medicine | Admitting: Emergency Medicine

## 2014-12-16 ENCOUNTER — Encounter (HOSPITAL_COMMUNITY): Payer: Self-pay | Admitting: Nurse Practitioner

## 2014-12-16 DIAGNOSIS — Z8719 Personal history of other diseases of the digestive system: Secondary | ICD-10-CM | POA: Insufficient documentation

## 2014-12-16 DIAGNOSIS — Z79899 Other long term (current) drug therapy: Secondary | ICD-10-CM | POA: Diagnosis not present

## 2014-12-16 DIAGNOSIS — F03918 Unspecified dementia, unspecified severity, with other behavioral disturbance: Secondary | ICD-10-CM

## 2014-12-16 DIAGNOSIS — Z008 Encounter for other general examination: Secondary | ICD-10-CM | POA: Diagnosis present

## 2014-12-16 DIAGNOSIS — Z658 Other specified problems related to psychosocial circumstances: Secondary | ICD-10-CM

## 2014-12-16 DIAGNOSIS — F0391 Unspecified dementia with behavioral disturbance: Secondary | ICD-10-CM | POA: Insufficient documentation

## 2014-12-16 DIAGNOSIS — M158 Other polyosteoarthritis: Secondary | ICD-10-CM | POA: Insufficient documentation

## 2014-12-16 DIAGNOSIS — Z7982 Long term (current) use of aspirin: Secondary | ICD-10-CM | POA: Diagnosis not present

## 2014-12-16 DIAGNOSIS — Z8742 Personal history of other diseases of the female genital tract: Secondary | ICD-10-CM | POA: Diagnosis not present

## 2014-12-16 DIAGNOSIS — I10 Essential (primary) hypertension: Secondary | ICD-10-CM | POA: Insufficient documentation

## 2014-12-16 DIAGNOSIS — F22 Delusional disorders: Secondary | ICD-10-CM | POA: Diagnosis present

## 2014-12-16 DIAGNOSIS — E119 Type 2 diabetes mellitus without complications: Secondary | ICD-10-CM | POA: Insufficient documentation

## 2014-12-16 DIAGNOSIS — Z87448 Personal history of other diseases of urinary system: Secondary | ICD-10-CM | POA: Insufficient documentation

## 2014-12-16 DIAGNOSIS — Z049 Encounter for examination and observation for unspecified reason: Secondary | ICD-10-CM

## 2014-12-16 MED ORDER — DONEPEZIL HCL 10 MG PO TABS
10.0000 mg | ORAL_TABLET | Freq: Every day | ORAL | Status: DC
  Administered 2014-12-16 – 2014-12-18 (×2): 10 mg via ORAL
  Filled 2014-12-16 (×6): qty 1

## 2014-12-16 MED ORDER — ASPIRIN EC 81 MG PO TBEC
81.0000 mg | DELAYED_RELEASE_TABLET | Freq: Every day | ORAL | Status: DC
  Administered 2014-12-16 – 2014-12-19 (×4): 81 mg via ORAL
  Filled 2014-12-16 (×4): qty 1

## 2014-12-16 MED ORDER — LORATADINE 10 MG PO TABS
10.0000 mg | ORAL_TABLET | Freq: Every day | ORAL | Status: DC
  Administered 2014-12-16 – 2014-12-19 (×4): 10 mg via ORAL
  Filled 2014-12-16 (×4): qty 1

## 2014-12-16 MED ORDER — IRBESARTAN 150 MG PO TABS
150.0000 mg | ORAL_TABLET | Freq: Every day | ORAL | Status: DC
  Administered 2014-12-16 – 2014-12-19 (×4): 150 mg via ORAL
  Filled 2014-12-16 (×5): qty 1

## 2014-12-16 MED ORDER — DIAZEPAM 5 MG PO TABS
5.0000 mg | ORAL_TABLET | Freq: Two times a day (BID) | ORAL | Status: DC | PRN
  Administered 2014-12-17 – 2014-12-19 (×3): 5 mg via ORAL
  Filled 2014-12-16 (×5): qty 1

## 2014-12-16 MED ORDER — PANTOPRAZOLE SODIUM 40 MG PO TBEC
40.0000 mg | DELAYED_RELEASE_TABLET | Freq: Every day | ORAL | Status: DC
  Administered 2014-12-16 – 2014-12-19 (×4): 40 mg via ORAL
  Filled 2014-12-16 (×4): qty 1

## 2014-12-16 MED ORDER — VITAMIN D (ERGOCALCIFEROL) 1.25 MG (50000 UNIT) PO CAPS
50000.0000 [IU] | ORAL_CAPSULE | ORAL | Status: DC
  Administered 2014-12-16: 50000 [IU] via ORAL
  Filled 2014-12-16: qty 1

## 2014-12-16 MED ORDER — POLYETHYLENE GLYCOL 3350 17 G PO PACK
17.0000 g | PACK | Freq: Every day | ORAL | Status: DC | PRN
  Administered 2014-12-17: 17 g via ORAL
  Filled 2014-12-16: qty 1

## 2014-12-16 MED ORDER — CITALOPRAM HYDROBROMIDE 20 MG PO TABS
20.0000 mg | ORAL_TABLET | Freq: Every day | ORAL | Status: DC
  Administered 2014-12-16 – 2014-12-19 (×4): 20 mg via ORAL
  Filled 2014-12-16 (×5): qty 1

## 2014-12-16 NOTE — ED Notes (Signed)
Bed: WA30 Expected date:  Expected time:  Means of arrival:  Comments: 
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
## 2014-12-17 ENCOUNTER — Other Ambulatory Visit: Payer: Self-pay | Admitting: Family Medicine

## 2014-12-17 ENCOUNTER — Emergency Department (HOSPITAL_COMMUNITY): Payer: Medicare Other

## 2014-12-17 DIAGNOSIS — F0391 Unspecified dementia with behavioral disturbance: Secondary | ICD-10-CM | POA: Diagnosis not present

## 2014-12-17 LAB — URINALYSIS, ROUTINE W REFLEX MICROSCOPIC
BILIRUBIN URINE: NEGATIVE
GLUCOSE, UA: NEGATIVE mg/dL
HGB URINE DIPSTICK: NEGATIVE
Ketones, ur: NEGATIVE mg/dL
Leukocytes, UA: NEGATIVE
Nitrite: NEGATIVE
Protein, ur: NEGATIVE mg/dL
SPECIFIC GRAVITY, URINE: 1.011 (ref 1.005–1.030)
UROBILINOGEN UA: 2 mg/dL — AB (ref 0.0–1.0)
pH: 8 (ref 5.0–8.0)

## 2014-12-17 LAB — RAPID URINE DRUG SCREEN, HOSP PERFORMED
AMPHETAMINES: NOT DETECTED
Barbiturates: NOT DETECTED
Benzodiazepines: POSITIVE — AB
Cocaine: NOT DETECTED
OPIATES: NOT DETECTED
TETRAHYDROCANNABINOL: NOT DETECTED

## 2014-12-17 LAB — CBC
HCT: 38.1 % (ref 36.0–46.0)
HEMOGLOBIN: 13.4 g/dL (ref 12.0–15.0)
MCH: 33.3 pg (ref 26.0–34.0)
MCHC: 35.2 g/dL (ref 30.0–36.0)
MCV: 94.8 fL (ref 78.0–100.0)
Platelets: 125 10*3/uL — ABNORMAL LOW (ref 150–400)
RBC: 4.02 MIL/uL (ref 3.87–5.11)
RDW: 12.2 % (ref 11.5–15.5)
WBC: 11.9 10*3/uL — AB (ref 4.0–10.5)

## 2014-12-17 LAB — COMPREHENSIVE METABOLIC PANEL
ALK PHOS: 102 U/L (ref 38–126)
ALT: 12 U/L — AB (ref 14–54)
AST: 29 U/L (ref 15–41)
Albumin: 4.2 g/dL (ref 3.5–5.0)
Anion gap: 8 (ref 5–15)
BUN: 11 mg/dL (ref 6–20)
CALCIUM: 9.5 mg/dL (ref 8.9–10.3)
CHLORIDE: 105 mmol/L (ref 101–111)
CO2: 24 mmol/L (ref 22–32)
CREATININE: 0.77 mg/dL (ref 0.44–1.00)
Glucose, Bld: 118 mg/dL — ABNORMAL HIGH (ref 65–99)
Potassium: 4.5 mmol/L (ref 3.5–5.1)
Sodium: 137 mmol/L (ref 135–145)
Total Bilirubin: 1 mg/dL (ref 0.3–1.2)
Total Protein: 7.7 g/dL (ref 6.5–8.1)

## 2014-12-17 LAB — ETHANOL

## 2014-12-17 LAB — CBG MONITORING, ED: Glucose-Capillary: 110 mg/dL — ABNORMAL HIGH (ref 65–99)

## 2014-12-17 NOTE — ED Notes (Signed)
Pt respirations even and unlabored, skin warm and dry, pt does not respond to voice or physical stimuli, eyes closed, rapid eye movement noted, VSS, will continue to monitor.

## 2014-12-17 NOTE — ED Notes (Signed)
md made aware of pt refusing to open eyes. md at bedside and held ammonia capsule to pts nostrils pt kept moving head away from smell.

## 2014-12-17 NOTE — ED Notes (Signed)
Pt is resting. When pt awakes, pt will be placed on Hospital bed.

## 2014-12-17 NOTE — BH Assessment (Signed)
[facility] Assessment Progress Note  Pt has been placed under IVC by Donnelly Angelica, MD.  Petition faxed to [facility].  Service of Findings and Custody Order is pending as of this writing.  Pt's nurse agrees to call this Probation officer when it is served.  Jalene Mullet, [place] Triage Specialist [phone]

## 2014-12-17 NOTE — ED Notes (Signed)
[facility] psych provider walking down the hall, rn explained that pt at this time was not opening her eyes or moving much, but that 2 mds have evaluated pt.
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
## 2014-12-17 NOTE — ED Notes (Signed)
Bed: FG18 Expected date:  Expected time:  Means of arrival:  Comments: Pt from TCU

## 2014-12-17 NOTE — ED Notes (Addendum)
[facility] came to pick pt up for scan. Pt was unable to wake with calling of name and sternal rub. Pt has Pulse. Radiology was not comfortable taking pt and stated to call Radiology in ED when she is awake. RN is made aware.

## 2014-12-17 NOTE — Progress Notes (Signed)
APS staff in to see pt, reviewed clinicals
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
## 2014-12-17 NOTE — BHH Counselor (Signed)
Dr. Lovena Le recommends geropsych placement at this time. TTS to seek placement.   Rosalin Hawking, LCSW Therapeutic Triage Specialist Secretary 12/17/2014 1:16 PM

## 2014-12-17 NOTE — ED Notes (Signed)
Dr. Taylor at bedside.

## 2014-12-17 NOTE — BH Assessment (Signed)
[facility] Assessment Progress Note  The following geriatric facilities have been contacted to seek placement for this pt, with results as noted:  Beds available, information sent, decision pending:  [place]   No beds available, but accepting referrals for future consideration; information sent:  [place]:  [facility]   Pt is diagnosed with dementia.  For this reason the following facilities were not contacted, as this constitutes exclusionary criteria for them:  [place], [place] Triage Specialist [phone]

## 2014-12-17 NOTE — ED Notes (Signed)
rn at bedside. Pt protects face from hand, when testing arm strength. Pt lets hand drop next to face, but not hit her face. Pt resists letting rn open her closed eyes. rn attempted to open mouth by pulling chin down, pt resisting and keeping mouth closed. pts vitals stable. Will check cbg
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
## 2014-12-17 NOTE — ED Notes (Signed)
Pt resting in bed with eyes closed.

## 2014-12-17 NOTE — ED Notes (Signed)
In room to give pt medicine.  Patient sleeping.  Attempted to arouse patient.  Patient continues to sleep.  DT26; Z12. Pt in NAD.  Will hold aricept until patient wakes up.

## 2014-12-17 NOTE — ED Notes (Signed)
Pt is eating Breakfast, Will update VS when finished

## 2014-12-17 NOTE — ED Notes (Signed)
APS came by to talk with pt. Pt still not talking at this point.
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
## 2014-12-17 NOTE — ED Notes (Signed)
md reece at bedside, rn has not noticed pt moving or opening eyes since 1315. Per md pt is stable and neurologically intact. Pt did make eye contact with md when md assessing.

## 2014-12-17 NOTE — ED Notes (Signed)
Pt has eyes closed, Normal breathing witnessed. Pt not responding to commands to obtain oral temp.

## 2014-12-17 NOTE — ED Notes (Signed)
Gave report to Antony Haste, Therapist, sports. Pt moved to room 25.

## 2014-12-18 DIAGNOSIS — F0391 Unspecified dementia with behavioral disturbance: Secondary | ICD-10-CM | POA: Diagnosis not present

## 2014-12-18 LAB — CBG MONITORING, ED: Glucose-Capillary: 171 mg/dL — ABNORMAL HIGH (ref 65–99)

## 2014-12-18 MED ORDER — ACETAMINOPHEN 325 MG PO TABS
650.0000 mg | ORAL_TABLET | Freq: Four times a day (QID) | ORAL | Status: DC | PRN
  Administered 2014-12-18: 650 mg via ORAL
  Filled 2014-12-18: qty 2

## 2014-12-18 NOTE — BH Assessment (Signed)
[facility] Assessment Progress Note  At 16:25 Nunzio Cory from [facility] reports pt has been declined due to post treatment placement problems.  At 16:30 [facility] reports pt has been declined due to medical acuity.  Jalene Mullet, [place] Triage Specialist [phone]

## 2014-12-18 NOTE — ED Notes (Signed)
Pt continues to ask about "two long gold bibles that the nurses were looking through and laughing about and making copies of last night. I came in with a red pocketbook." Checked all lockers for patient belongings. None located. Checked with security. No patient belongings located anywhere.

## 2014-12-18 NOTE — ED Notes (Signed)
Pt is complaining of neck and right lower abd pain. Provided patient warm compresses for comfort. Pt is constantly talking about her spouse. Alert, oriented x 3.

## 2014-12-18 NOTE — ED Notes (Signed)
Patient is resting comfortably. 

## 2014-12-18 NOTE — ED Notes (Signed)
Spoke with pt husband on the phone and asked him about pt tumor.  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  Husband states that pt has dementia and some of the things she says get him in trouble.  Husband describes taking care of his wife at home and some of the challenges he faced due to her dementia (difficulty with leaving her at all to go to grocery store).  Pt was happy to speak with her husband and states that she wants to go back home to him as he takes good care of her.

## 2014-12-18 NOTE — ED Notes (Addendum)
Unsuccessfully attempted to wake pt in an order to attempt to find out more information about pt cancer diagnosis and treatment (as there is very little information on her chart other than that it appears that there is no progression of disease evidenced on CT and there is no evidence of being followed by a MD for this).  Pt appears fine but not wanting to respond to me at this time.
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
## 2014-12-18 NOTE — BH Assessment (Signed)
Spoke with Anderson Malta at [facility] regarding pt's cancer treatment. PT will be considered there for inpt treatment.

## 2014-12-18 NOTE — ED Notes (Signed)
Pt spoke with her husband and husband became very upset that pt was allowed to sleep so late.  Explained to him that we did bring her breakfast when it arrived.  Also told him that we attempted to arouse her.  Pt did now eat and i checked her CBG which was 171.  Offered toileting which pt refused at this time.
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
## 2014-12-18 NOTE — ED Notes (Signed)
Pt is awake, warmed up her breakfast

## 2014-12-19 DIAGNOSIS — F03918 Unspecified dementia, unspecified severity, with other behavioral disturbance: Secondary | ICD-10-CM | POA: Insufficient documentation

## 2014-12-19 DIAGNOSIS — F0391 Unspecified dementia with behavioral disturbance: Secondary | ICD-10-CM

## 2014-12-19 DIAGNOSIS — F22 Delusional disorders: Secondary | ICD-10-CM

## 2014-12-19 NOTE — BH Assessment (Signed)
[place] Assessment Progress Note  Per Donnelly Angelica, MD, this pt continues to require psychiatric hospitalization.  At 15:30 Nunzio Cory at [facility] reports that pt has been accepted to their facility by Dr. Ananias Pilgrim.  Please call report to [phone].  Charmaine Downs, NP concurs with this decision.  Pt's nurse has been notified, and he agrees to call report.  Pt is under IVC and is to be transported via [facility].  Jalene Mullet, [place] Triage Specialist [phone]

## 2014-12-19 NOTE — Progress Notes (Signed)
Pt accepted to [facility]. Pt states she does not want to see her husband again. Pt being transferred by IVC. CSW informed patient DSS case worker regarding [place] admission.   Belia Heman, [employer] Work  [employer] [phone]

## 2014-12-19 NOTE — ED Notes (Signed)
Pt being transfer to [facility] per [facility]

## 2014-12-19 NOTE — Progress Notes (Signed)
While ED CM visiting with other pt in [place], Pt husband present visiting pt.  Cm noted when CM first entered TCU, [employer] was noted speaking with husband (has a rollator in front of him) as he spoke and argued loudly to them about visiting pt Later during the visit husband came out to the [place] stating "I am her POA.  I want her discharge. I'm going to take her home with me"  TCU RN and CNA staff attempted to explain to husband that he could not take the pt home because she is under IVC but he would frequently interrupt each staff member in an aggressive tone, yelling, waving papers at CNA standing beside him and saying what he was going to do and requesting to speak with the doctor.   CM interrupted husband to informed him re state what the TCU staff was informing him, informed him the [facility] MDs visit this am and will not re visit, and informed him  that the IVC over rides the POA and the pt will remain in [facility] for placement assistance.  He then stated "I guess I can just throw away these papers" Referring to papers he had in a plastic bag.  CM provided him with a waste container but he returned the papers to the plastic bag and returned to pt room to continue his visit
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
## 2014-12-19 NOTE — ED Notes (Addendum)
This rn was not present at all when pts husband was visiting, but when rn was giving pt meds, pt kept saying " I don't want to see him again".   During rn interaction, pt did not mentions anything about "powers controlling her or spells being cast on her". Pt pleasant, requesting her coffee to be reheated. Pt willingly took medications. Audry Pili, RN stated pt will not talk to him, unsure if this is due to nurse being female. Pt has been talking to female sitter and this nurse.
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
## 2014-12-19 NOTE — ED Notes (Signed)
Family at bedside. 

## 2014-12-19 NOTE — ED Notes (Signed)
Pt refused all medications 

## 2014-12-19 NOTE — ED Notes (Signed)
MD at bedside. 

## 2015-06-04 ENCOUNTER — Other Ambulatory Visit (HOSPITAL_COMMUNITY): Payer: Self-pay | Admitting: Interventional Radiology

## 2015-06-04 DIAGNOSIS — N2889 Other specified disorders of kidney and ureter: Secondary | ICD-10-CM

## 2015-06-18 ENCOUNTER — Encounter: Payer: Self-pay | Admitting: Radiology

## 2015-10-02 ENCOUNTER — Telehealth: Payer: Self-pay | Admitting: Radiology

## 2015-10-02 NOTE — Telephone Encounter (Signed)
Left msg on H# requesting patient call back to schedule follow up appointment with Dr. Aletta Edouard.  Kristain Filo Riki Rusk, [place] 10/02/2015 2:24 PM

## 2016-03-18 ENCOUNTER — Encounter: Payer: Self-pay | Admitting: Interventional Radiology

## 2016-12-02 IMAGING — CT CT ABD-PELV W/ CM
2 of 5 series · 16 of 46 positions shown, 18 images · IV contrast (OMNIPAQUE 300)
Comparison: April 27, 2014

CLINICAL DATA: Abdomen pain. Patient states she has cancer of the
stomach and kidney.

EXAM:
CT ABDOMEN AND PELVIS WITH CONTRAST
TECHNIQUE: Multidetector CT imaging of the abdomen and pelvis was performed
using the standard protocol following bolus administration of
intravenous contrast.
CONTRAST:  100mL OMNIPAQUE IOHEXOL 300 MG/ML  SOLN

[Series 2: abd/pel with · axial · 0.74mm/px · z∈[+962,+1387]mm · 13 of 96 slices shown, 15 images]
[im 6/96  soft-tissue]
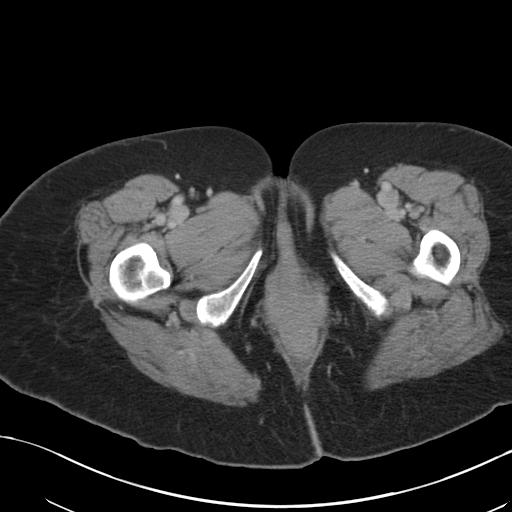
[im 6/96  bone]
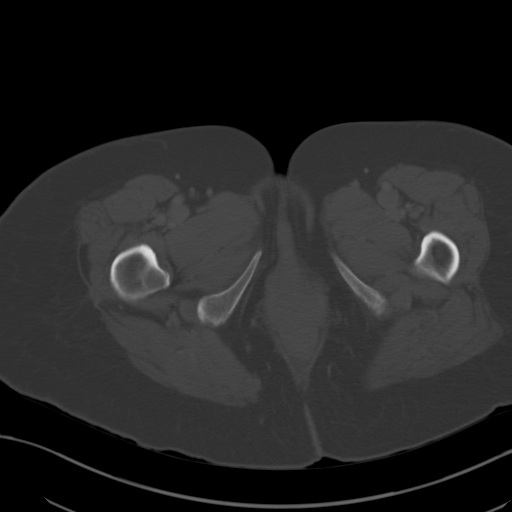
[im 16/96  soft-tissue]
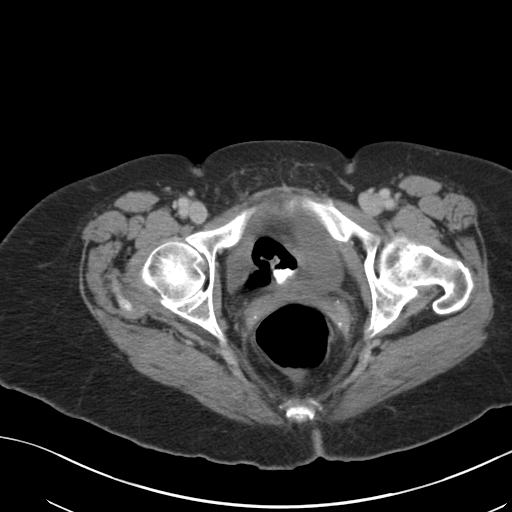
[im 21/96  soft-tissue]
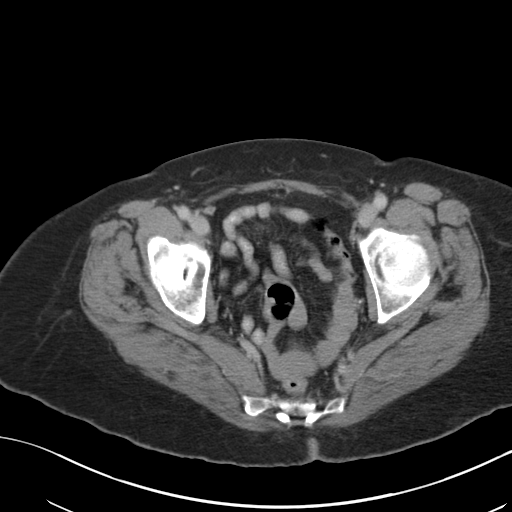
[im 26/96  soft-tissue]
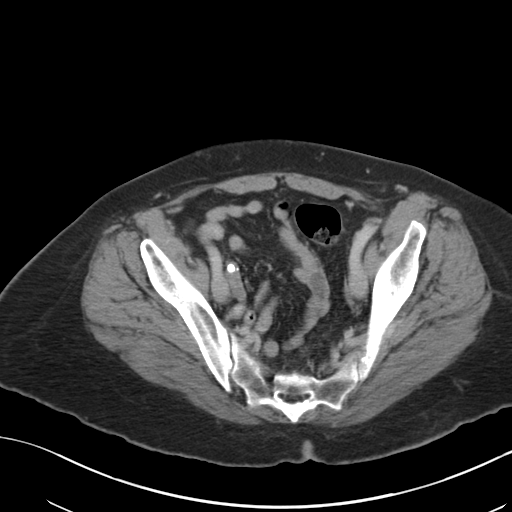
[im 36/96  soft-tissue]
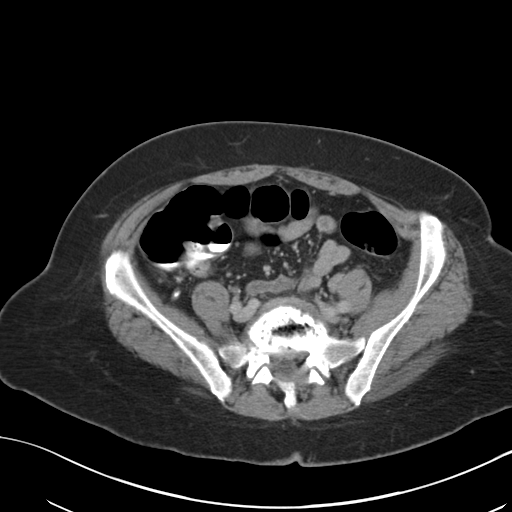
[im 41/96  soft-tissue]
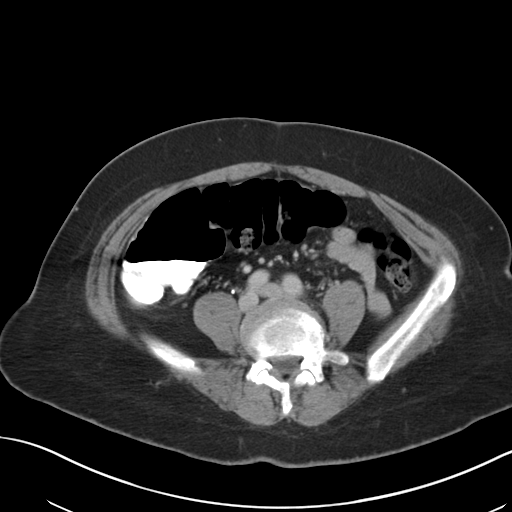
[im 51/96  soft-tissue]
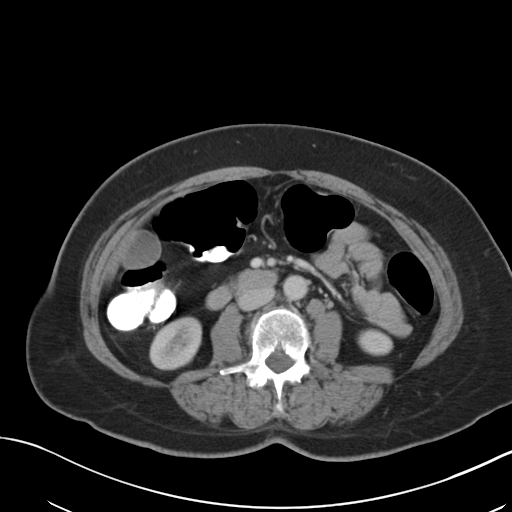
[im 56/96  soft-tissue]
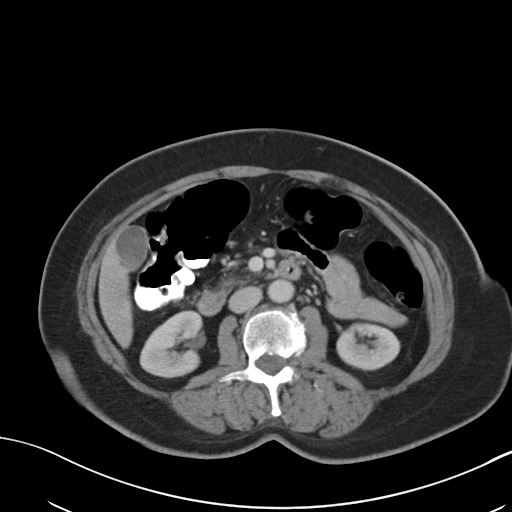
[im 61/96  soft-tissue]
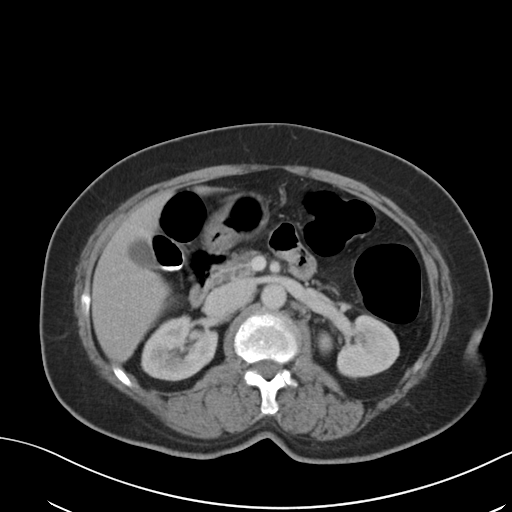
[im 61/96  bone]
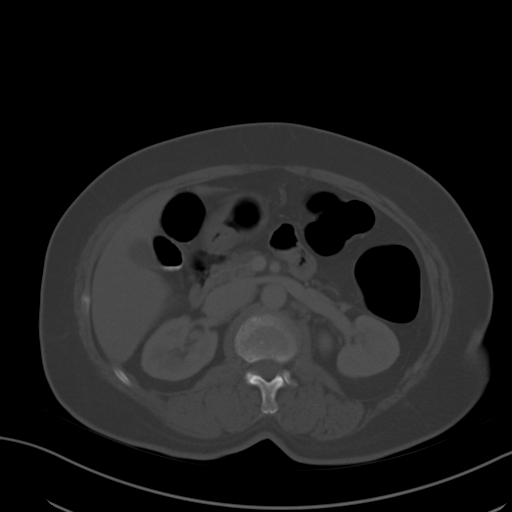
[im 71/96  soft-tissue]
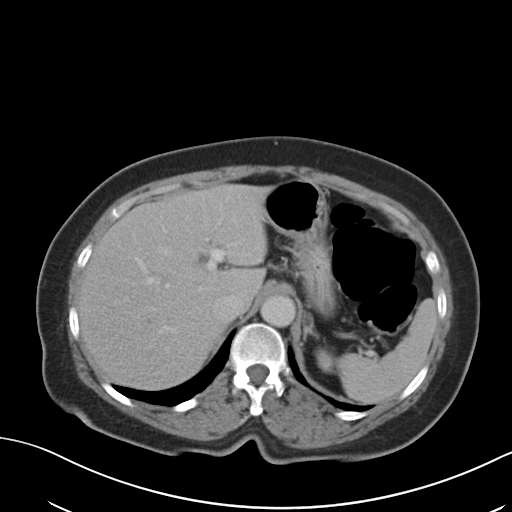
[im 76/96  soft-tissue]
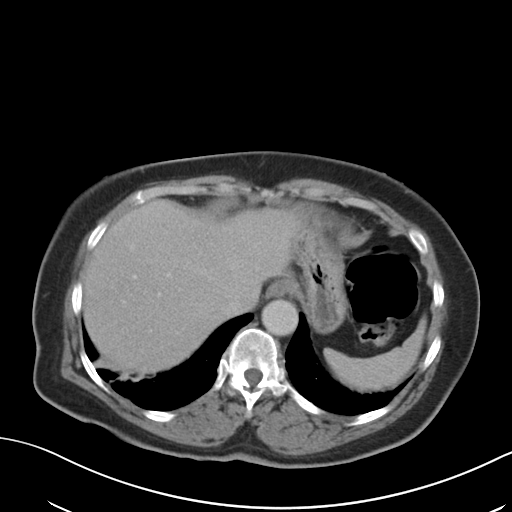
[im 81/96  soft-tissue]
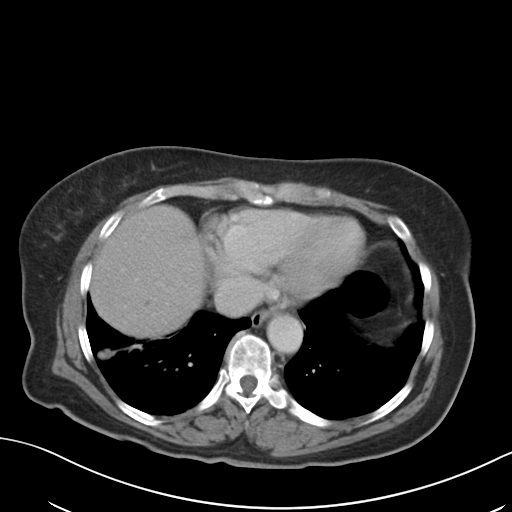
[im 91/96  soft-tissue]
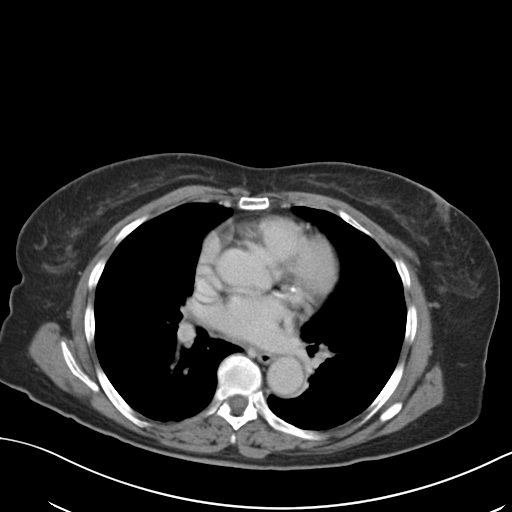

[Series 5: coronal a/|p · coronal · 0.74mm/px · 3 of 88 slices shown]
[im 30/88  soft-tissue]
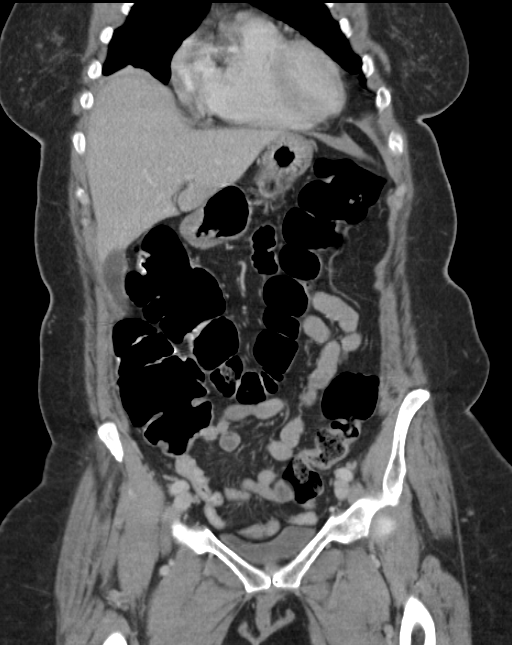
[im 39/88  soft-tissue]
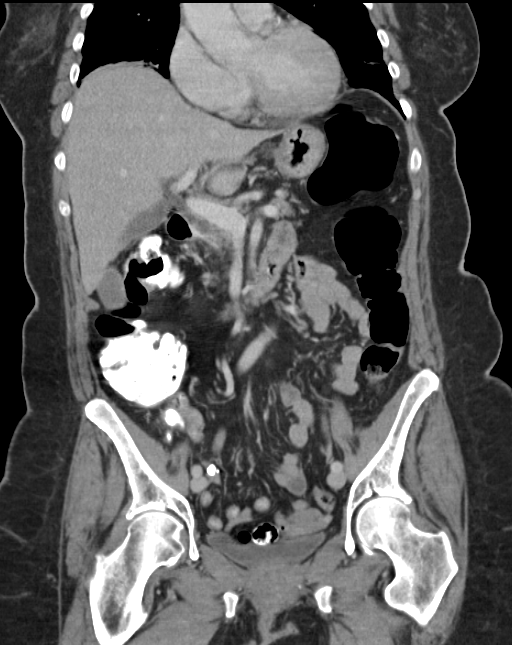
[im 49/88  soft-tissue]
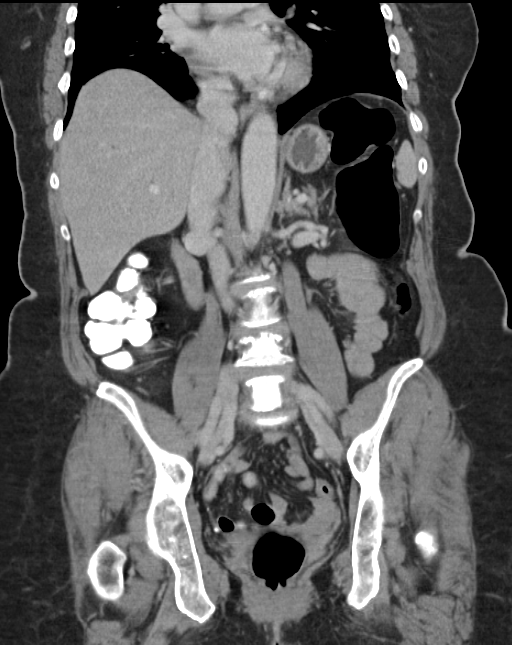

[16 of 46 positions shown; findings below may reference images not displayed]

FINDINGS: There are small low-density lesions in the dome of the liver largest
measures 3.7 mm unchanged compared prior exam probably cysts. The
spleen, pancreas, gallbladder, right adrenal gland are normal. There
is a small low-density lesion in the left adrenal gland unchanged.
The left kidney is normal. There is no hydronephrosis bilaterally.
There is atherosclerosis of the abdominal aorta without aneurysmal
dilatation. There is no abdominal lymphadenopathy.

In the medial lower pole right kidney, there is a 1.7 by 1.5 cm
enhancing lesion unchanged compared prior exam presumably
representing the patient's known kidney cancer.

Images of the bowel demonstrate bowel wall thickening in the fundus
and body of the stomach and to a lesser degree distal stomach
presumably representing patient's known stomach cancer. There is no
small bowel obstruction or diverticulitis. The appendix is normal.

Partial fluid-filled bladder is normal. There is no pelvic
lymphadenopathy. Patient status post prior hysterectomy. There is
mild atelectasis of the bilateral lung bases. Degenerative joint
changes of the spine are identified.
IMPRESSION: No acute abnormality identified in the abdomen and pelvis. There is
no bowel obstruction.

Enhancing lesion in the lower pole right kidney presumably
representing the patient's known kidney cancer.

Bowel wall thickening of the stomach presumably represent patient's
known stomach cancer.
